# Patient Record
Sex: Female | Born: 1980 | Race: Black or African American | Hispanic: No | Marital: Single | Smoking: Current every day smoker
Health system: Southern US, Community
[De-identification: ages and names within clinical notes are randomized; demographics above are authoritative.]

## PROBLEM LIST (undated history)

## (undated) DIAGNOSIS — F1990 Other psychoactive substance use, unspecified, uncomplicated: Secondary | ICD-10-CM

## (undated) DIAGNOSIS — F419 Anxiety disorder, unspecified: Secondary | ICD-10-CM

## (undated) DIAGNOSIS — D219 Benign neoplasm of connective and other soft tissue, unspecified: Secondary | ICD-10-CM

## (undated) DIAGNOSIS — F329 Major depressive disorder, single episode, unspecified: Secondary | ICD-10-CM

## (undated) DIAGNOSIS — F32A Depression, unspecified: Secondary | ICD-10-CM

## (undated) DIAGNOSIS — I10 Essential (primary) hypertension: Secondary | ICD-10-CM

## (undated) DIAGNOSIS — R011 Cardiac murmur, unspecified: Secondary | ICD-10-CM

## (undated) DIAGNOSIS — N83209 Unspecified ovarian cyst, unspecified side: Secondary | ICD-10-CM

## (undated) DIAGNOSIS — F319 Bipolar disorder, unspecified: Secondary | ICD-10-CM

## (undated) DIAGNOSIS — D649 Anemia, unspecified: Secondary | ICD-10-CM

## (undated) HISTORY — DX: Anxiety disorder, unspecified: F41.9

## (undated) HISTORY — DX: Bipolar disorder, unspecified: F31.9

## (undated) HISTORY — DX: Benign neoplasm of connective and other soft tissue, unspecified: D21.9

---

## 2003-11-07 ENCOUNTER — Emergency Department (HOSPITAL_COMMUNITY): Admission: EM | Admit: 2003-11-07 | Discharge: 2003-11-07 | Payer: Self-pay | Admitting: Emergency Medicine

## 2003-11-18 ENCOUNTER — Emergency Department (HOSPITAL_COMMUNITY): Admission: EM | Admit: 2003-11-18 | Discharge: 2003-11-18 | Payer: Self-pay | Admitting: Emergency Medicine

## 2004-01-12 ENCOUNTER — Inpatient Hospital Stay (HOSPITAL_COMMUNITY): Admission: AD | Admit: 2004-01-12 | Discharge: 2004-01-12 | Payer: Self-pay | Admitting: *Deleted

## 2007-09-23 ENCOUNTER — Emergency Department (HOSPITAL_COMMUNITY): Admission: EM | Admit: 2007-09-23 | Discharge: 2007-09-23 | Payer: Self-pay | Admitting: Emergency Medicine

## 2008-04-09 ENCOUNTER — Emergency Department (HOSPITAL_COMMUNITY): Admission: EM | Admit: 2008-04-09 | Discharge: 2008-04-09 | Payer: Self-pay | Admitting: Emergency Medicine

## 2008-06-12 ENCOUNTER — Emergency Department (HOSPITAL_COMMUNITY): Admission: EM | Admit: 2008-06-12 | Discharge: 2008-06-12 | Payer: Self-pay | Admitting: *Deleted

## 2011-05-09 LAB — URINALYSIS, ROUTINE W REFLEX MICROSCOPIC
Glucose, UA: NEGATIVE
Ketones, ur: NEGATIVE
Leukocytes, UA: NEGATIVE
Nitrite: NEGATIVE
Protein, ur: 30 — AB
Specific Gravity, Urine: 1.027
Urobilinogen, UA: 0.2

## 2011-05-09 LAB — WET PREP, GENITAL: Yeast Wet Prep HPF POC: NONE SEEN

## 2011-05-09 LAB — PREGNANCY, URINE: Preg Test, Ur: POSITIVE

## 2011-05-09 LAB — URINE MICROSCOPIC-ADD ON

## 2011-05-09 LAB — GC/CHLAMYDIA PROBE AMP, GENITAL: Chlamydia, DNA Probe: NEGATIVE

## 2011-05-09 LAB — HCG, QUANTITATIVE, PREGNANCY: hCG, Beta Chain, Quant, S: 44799 — ABNORMAL HIGH

## 2016-11-06 ENCOUNTER — Encounter (HOSPITAL_COMMUNITY): Payer: Self-pay

## 2016-11-06 ENCOUNTER — Emergency Department (HOSPITAL_COMMUNITY)
Admission: EM | Admit: 2016-11-06 | Discharge: 2016-11-06 | Disposition: A | Discharge status: Home / Self Care | Payer: Self-pay | Attending: Emergency Medicine | Admitting: Emergency Medicine

## 2016-11-06 DIAGNOSIS — N760 Acute vaginitis: Secondary | ICD-10-CM

## 2016-11-06 DIAGNOSIS — F17290 Nicotine dependence, other tobacco product, uncomplicated: Secondary | ICD-10-CM | POA: Insufficient documentation

## 2016-11-06 DIAGNOSIS — B9689 Other specified bacterial agents as the cause of diseases classified elsewhere: Secondary | ICD-10-CM

## 2016-11-06 DIAGNOSIS — O99330 Smoking (tobacco) complicating pregnancy, unspecified trimester: Secondary | ICD-10-CM | POA: Insufficient documentation

## 2016-11-06 DIAGNOSIS — Z3A00 Weeks of gestation of pregnancy not specified: Secondary | ICD-10-CM | POA: Insufficient documentation

## 2016-11-06 DIAGNOSIS — N946 Dysmenorrhea, unspecified: Secondary | ICD-10-CM | POA: Insufficient documentation

## 2016-11-06 DIAGNOSIS — O23599 Infection of other part of genital tract in pregnancy, unspecified trimester: Secondary | ICD-10-CM | POA: Insufficient documentation

## 2016-11-06 HISTORY — DX: Unspecified ovarian cyst, unspecified side: N83.209

## 2016-11-06 LAB — BASIC METABOLIC PANEL
ANION GAP: 10 (ref 5–15)
BUN: 17 mg/dL (ref 6–20)
CHLORIDE: 107 mmol/L (ref 101–111)
CO2: 22 mmol/L (ref 22–32)
Calcium: 8.7 mg/dL — ABNORMAL LOW (ref 8.9–10.3)
Creatinine, Ser: 0.94 mg/dL (ref 0.44–1.00)
GFR calc non Af Amer: >60 mL/min (ref >60)
GLUCOSE: 108 mg/dL — AB (ref 65–99)
POTASSIUM: 3.7 mmol/L (ref 3.5–5.1)
Sodium: 139 mmol/L (ref 135–145)

## 2016-11-06 LAB — CBC
HEMATOCRIT: 37.6 % (ref 36.0–46.0)
HEMOGLOBIN: 12.3 g/dL (ref 12.0–15.0)
MCH: 30 pg (ref 26.0–34.0)
MCHC: 32.7 g/dL (ref 30.0–36.0)
MCV: 91.7 fL (ref 78.0–100.0)
Platelets: 282 10*3/uL (ref 150–400)
RBC: 4.1 MIL/uL (ref 3.87–5.11)
RDW: 14.8 % (ref 11.5–15.5)
WBC: 5.9 10*3/uL (ref 4.0–10.5)

## 2016-11-06 LAB — I-STAT BETA HCG BLOOD, ED (MC, WL, AP ONLY): I-stat hCG, quantitative: <5 m[IU]/mL (ref <5)

## 2016-11-06 LAB — WET PREP, GENITAL
SPERM: NONE SEEN
TRICH WET PREP: NONE SEEN
Yeast Wet Prep HPF POC: NONE SEEN

## 2016-11-06 MED ORDER — AZITHROMYCIN 250 MG PO TABS
1000.0000 mg | ORAL_TABLET | Freq: Once | ORAL | Status: AC
  Administered 2016-11-06: 1000 mg via ORAL
  Filled 2016-11-06: qty 4

## 2016-11-06 MED ORDER — CEFTRIAXONE SODIUM 250 MG IJ SOLR
250.0000 mg | Freq: Once | INTRAMUSCULAR | Status: AC
  Administered 2016-11-06: 250 mg via INTRAMUSCULAR
  Filled 2016-11-06: qty 250

## 2016-11-06 MED ORDER — LIDOCAINE HCL (PF) 1 % IJ SOLN
INTRAMUSCULAR | Status: AC
  Administered 2016-11-06: 0.9 mL
  Filled 2016-11-06: qty 5

## 2016-11-06 MED ORDER — METRONIDAZOLE 500 MG PO TABS: 500.0000 mg | ORAL_TABLET | Freq: Two times a day (BID) | ORAL | 0 refills | Status: DC

## 2016-11-06 NOTE — ED Triage Notes (Signed)
Per Pt, Pt is coming from home with complaints of vaginal bleeding. Pt reports taking a positive pregnancy test one month ago. Denies any clots. Last Menstrual Cylce was the beginning of February. Reports sharp pains in her stomach.
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
## 2016-11-07 LAB — HIV ANTIBODY (ROUTINE TESTING W REFLEX): HIV SCREEN 4TH GENERATION: NONREACTIVE

## 2016-11-07 LAB — GC/CHLAMYDIA PROBE AMP (~~LOC~~) NOT AT ARMC
Chlamydia: NEGATIVE
NEISSERIA GONORRHEA: NEGATIVE

## 2016-11-07 LAB — RPR: RPR: NONREACTIVE

## 2017-04-07 ENCOUNTER — Emergency Department (HOSPITAL_COMMUNITY)
Admission: EM | Admit: 2017-04-07 | Discharge: 2017-04-07 | Disposition: A | Discharge status: Home / Self Care | Payer: Self-pay | Attending: Emergency Medicine | Admitting: Emergency Medicine

## 2017-04-07 ENCOUNTER — Ambulatory Visit (HOSPITAL_COMMUNITY)
Admission: EM | Admit: 2017-04-07 | Discharge: 2017-04-07 | Disposition: A | Discharge status: Home / Self Care | Payer: Self-pay

## 2017-04-07 ENCOUNTER — Encounter (HOSPITAL_COMMUNITY): Payer: Self-pay

## 2017-04-07 DIAGNOSIS — Z0441 Encounter for examination and observation following alleged adult rape: Secondary | ICD-10-CM | POA: Insufficient documentation

## 2017-04-07 DIAGNOSIS — Z5321 Procedure and treatment not carried out due to patient leaving prior to being seen by health care provider: Secondary | ICD-10-CM | POA: Insufficient documentation
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
## 2017-04-07 NOTE — ED Notes (Addendum)
Called out pt's name x3, no answer. Looked for pt in triage, no answer. Notified Audrey(RN)

## 2017-05-19 ENCOUNTER — Emergency Department (HOSPITAL_COMMUNITY)
Admission: EM | Admit: 2017-05-19 | Discharge: 2017-05-20 | Disposition: A | Discharge status: Home / Self Care | Payer: Self-pay | Attending: Emergency Medicine | Admitting: Emergency Medicine

## 2017-05-19 DIAGNOSIS — R5381 Other malaise: Secondary | ICD-10-CM | POA: Insufficient documentation

## 2017-05-19 DIAGNOSIS — F17210 Nicotine dependence, cigarettes, uncomplicated: Secondary | ICD-10-CM | POA: Insufficient documentation

## 2017-05-19 LAB — URINALYSIS, ROUTINE W REFLEX MICROSCOPIC
Bilirubin Urine: NEGATIVE
Glucose, UA: NEGATIVE mg/dL
HGB URINE DIPSTICK: NEGATIVE
Ketones, ur: NEGATIVE mg/dL
LEUKOCYTES UA: NEGATIVE
Nitrite: NEGATIVE
PROTEIN: 30 mg/dL — AB
SPECIFIC GRAVITY, URINE: 1.023 (ref 1.005–1.030)
pH: 5 (ref 5.0–8.0)

## 2017-05-19 LAB — COMPREHENSIVE METABOLIC PANEL
ALBUMIN: 3.8 g/dL (ref 3.5–5.0)
ALT: 54 U/L (ref 14–54)
AST: 62 U/L — AB (ref 15–41)
Alkaline Phosphatase: 92 U/L (ref 38–126)
Anion gap: 6 (ref 5–15)
BILIRUBIN TOTAL: 0.6 mg/dL (ref 0.3–1.2)
BUN: 11 mg/dL (ref 6–20)
CHLORIDE: 104 mmol/L (ref 101–111)
CO2: 23 mmol/L (ref 22–32)
CREATININE: 0.8 mg/dL (ref 0.44–1.00)
Calcium: 8.3 mg/dL — ABNORMAL LOW (ref 8.9–10.3)
GFR calc Af Amer: >60 mL/min (ref >60)
GLUCOSE: 97 mg/dL (ref 65–99)
POTASSIUM: 4 mmol/L (ref 3.5–5.1)
Sodium: 133 mmol/L — ABNORMAL LOW (ref 135–145)
Total Protein: 7.1 g/dL (ref 6.5–8.1)

## 2017-05-19 LAB — CBC WITH DIFFERENTIAL/PLATELET
Basophils Absolute: 0 10*3/uL (ref 0.0–0.1)
Basophils Relative: 1 %
EOS PCT: 1 %
Eosinophils Absolute: 0 10*3/uL (ref 0.0–0.7)
HEMATOCRIT: 38.5 % (ref 36.0–46.0)
Hemoglobin: 12.8 g/dL (ref 12.0–15.0)
LYMPHS PCT: 33 %
Lymphs Abs: 2 10*3/uL (ref 0.7–4.0)
MCH: 31.1 pg (ref 26.0–34.0)
MCHC: 33.2 g/dL (ref 30.0–36.0)
MCV: 93.4 fL (ref 78.0–100.0)
MONO ABS: 0.7 10*3/uL (ref 0.1–1.0)
MONOS PCT: 11 %
NEUTROS ABS: 3.3 10*3/uL (ref 1.7–7.7)
Neutrophils Relative %: 54 %
PLATELETS: 283 10*3/uL (ref 150–400)
RBC: 4.12 MIL/uL (ref 3.87–5.11)
RDW: 16.7 % — AB (ref 11.5–15.5)
WBC: 6 10*3/uL (ref 4.0–10.5)

## 2017-05-19 LAB — I-STAT BETA HCG BLOOD, ED (MC, WL, AP ONLY)

## 2017-05-19 LAB — LIPASE, BLOOD: LIPASE: 39 U/L (ref 11–51)
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
## 2017-05-20 NOTE — Discharge Instructions (Signed)
You can be discharged home. Push plenty of fluids. Return here as needed for worsening symptoms or new concerns.

## 2017-07-23 ENCOUNTER — Other Ambulatory Visit: Payer: Self-pay

## 2017-07-23 ENCOUNTER — Encounter (HOSPITAL_COMMUNITY): Payer: Self-pay

## 2017-07-23 ENCOUNTER — Emergency Department (HOSPITAL_COMMUNITY)
Admission: EM | Admit: 2017-07-23 | Discharge: 2017-07-24 | Disposition: A | Discharge status: Other Acute Inpatient Hospital | Payer: Self-pay | Attending: Emergency Medicine | Admitting: Emergency Medicine

## 2017-07-23 DIAGNOSIS — R45851 Suicidal ideations: Secondary | ICD-10-CM | POA: Insufficient documentation

## 2017-07-23 DIAGNOSIS — F17290 Nicotine dependence, other tobacco product, uncomplicated: Secondary | ICD-10-CM | POA: Insufficient documentation

## 2017-07-23 DIAGNOSIS — F333 Major depressive disorder, recurrent, severe with psychotic symptoms: Secondary | ICD-10-CM | POA: Insufficient documentation

## 2017-07-23 HISTORY — DX: Depression, unspecified: F32.A

## 2017-07-23 HISTORY — DX: Major depressive disorder, single episode, unspecified: F32.9

## 2017-07-23 LAB — COMPREHENSIVE METABOLIC PANEL
ALK PHOS: 74 U/L (ref 38–126)
ALT: 83 U/L — ABNORMAL HIGH (ref 14–54)
ANION GAP: 10 (ref 5–15)
AST: 64 U/L — ABNORMAL HIGH (ref 15–41)
Albumin: 3.9 g/dL (ref 3.5–5.0)
BILIRUBIN TOTAL: 0.3 mg/dL (ref 0.3–1.2)
BUN: 6 mg/dL (ref 6–20)
CALCIUM: 8.6 mg/dL — AB (ref 8.9–10.3)
CO2: 25 mmol/L (ref 22–32)
Chloride: 106 mmol/L (ref 101–111)
Creatinine, Ser: 0.77 mg/dL (ref 0.44–1.00)
GFR calc non Af Amer: >60 mL/min (ref >60)
Glucose, Bld: 103 mg/dL — ABNORMAL HIGH (ref 65–99)
POTASSIUM: 3.8 mmol/L (ref 3.5–5.1)
SODIUM: 141 mmol/L (ref 135–145)
TOTAL PROTEIN: 7.2 g/dL (ref 6.5–8.1)

## 2017-07-23 LAB — RAPID URINE DRUG SCREEN, HOSP PERFORMED
Amphetamines: NOT DETECTED
BENZODIAZEPINES: NOT DETECTED
Barbiturates: NOT DETECTED
Cocaine: POSITIVE — AB
OPIATES: NOT DETECTED
Tetrahydrocannabinol: NOT DETECTED

## 2017-07-23 LAB — CBC
HEMATOCRIT: 41 % (ref 36.0–46.0)
HEMOGLOBIN: 13.6 g/dL (ref 12.0–15.0)
MCH: 32.3 pg (ref 26.0–34.0)
MCHC: 33.2 g/dL (ref 30.0–36.0)
MCV: 97.4 fL (ref 78.0–100.0)
Platelets: 292 10*3/uL (ref 150–400)
RBC: 4.21 MIL/uL (ref 3.87–5.11)
RDW: 15.1 % (ref 11.5–15.5)
WBC: 6.1 10*3/uL (ref 4.0–10.5)

## 2017-07-23 LAB — I-STAT BETA HCG BLOOD, ED (MC, WL, AP ONLY)

## 2017-07-23 LAB — ACETAMINOPHEN LEVEL

## 2017-07-23 LAB — SALICYLATE LEVEL

## 2017-07-23 LAB — ETHANOL: Alcohol, Ethyl (B): 167 mg/dL — ABNORMAL HIGH (ref <10)

## 2017-07-23 MED ORDER — LORAZEPAM 1 MG PO TABS
1.0000 mg | ORAL_TABLET | Freq: Once | ORAL | Status: AC
  Administered 2017-07-23: 1 mg via ORAL
  Filled 2017-07-23: qty 1

## 2017-07-23 NOTE — ED Notes (Signed)
All belongings inventoried and placed in locker #2
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
## 2017-07-23 NOTE — Care Management (Signed)
Per Corene Cornea, NP- patient meets inpt criteria.  Writer faxed referrals to the following facilities: 07/23/17 Referred to [facility], [facility], [facility], [facility], [facility], [facility], [facility], [facility]

## 2017-07-23 NOTE — ED Notes (Signed)
Patient denies pain and is resting comfortably.  

## 2017-07-23 NOTE — ED Triage Notes (Addendum)
Patient arrived tearful to ED. Complains of rectal bleeding for 2 years with BM. States that she took unknown medication 2 days ago in attempt to kill herself. Also has been self cutting-superficial for some time. Alert and oriented, NAD. hasnt taken behavioral meds for 1 year
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
## 2017-07-23 NOTE — ED Provider Notes (Signed)
Behavioral health recommends inpatient management; no available facilities a this time.  In the process of finding appropriate facility.  Patient will remain at [facility] for the time being.   Denise Regal, PA-C 07/23/17 2050    Duffy Bruce, MD 07/24/17 [phone]
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
## 2017-07-23 NOTE — ED Notes (Signed)
TTS at bedside. 

## 2017-07-24 ENCOUNTER — Inpatient Hospital Stay
Admission: AD | Admit: 2017-07-24 | Discharge: 2017-08-03 | DRG: 885 | Disposition: A | Discharge status: Home / Self Care | Payer: No Typology Code available for payment source | Source: Intra-hospital | Attending: Psychiatry | Admitting: Psychiatry

## 2017-07-24 DIAGNOSIS — H538 Other visual disturbances: Secondary | ICD-10-CM | POA: Diagnosis not present

## 2017-07-24 DIAGNOSIS — K5900 Constipation, unspecified: Secondary | ICD-10-CM | POA: Diagnosis not present

## 2017-07-24 DIAGNOSIS — F4310 Post-traumatic stress disorder, unspecified: Secondary | ICD-10-CM | POA: Diagnosis present

## 2017-07-24 DIAGNOSIS — F333 Major depressive disorder, recurrent, severe with psychotic symptoms: Secondary | ICD-10-CM | POA: Diagnosis present

## 2017-07-24 DIAGNOSIS — G4700 Insomnia, unspecified: Secondary | ICD-10-CM | POA: Diagnosis present

## 2017-07-24 DIAGNOSIS — F1420 Cocaine dependence, uncomplicated: Secondary | ICD-10-CM | POA: Diagnosis present

## 2017-07-24 DIAGNOSIS — Z91410 Personal history of adult physical and sexual abuse: Secondary | ICD-10-CM

## 2017-07-24 DIAGNOSIS — F17200 Nicotine dependence, unspecified, uncomplicated: Secondary | ICD-10-CM | POA: Diagnosis present

## 2017-07-24 DIAGNOSIS — Z915 Personal history of self-harm: Secondary | ICD-10-CM

## 2017-07-24 DIAGNOSIS — Z590 Homelessness: Secondary | ICD-10-CM

## 2017-07-24 DIAGNOSIS — R45851 Suicidal ideations: Secondary | ICD-10-CM | POA: Diagnosis present

## 2017-07-24 DIAGNOSIS — F1290 Cannabis use, unspecified, uncomplicated: Secondary | ICD-10-CM | POA: Diagnosis present

## 2017-07-24 DIAGNOSIS — F17290 Nicotine dependence, other tobacco product, uncomplicated: Secondary | ICD-10-CM | POA: Diagnosis present

## 2017-07-24 DIAGNOSIS — R51 Headache: Secondary | ICD-10-CM | POA: Diagnosis not present

## 2017-07-24 DIAGNOSIS — Y906 Blood alcohol level of 120-199 mg/100 ml: Secondary | ICD-10-CM | POA: Diagnosis present

## 2017-07-24 DIAGNOSIS — F1020 Alcohol dependence, uncomplicated: Secondary | ICD-10-CM | POA: Diagnosis present

## 2017-07-24 MED ORDER — HYDROXYZINE HCL 50 MG PO TABS
50.0000 mg | ORAL_TABLET | Freq: Three times a day (TID) | ORAL | Status: DC | PRN
Start: 2017-07-24 — End: 2017-07-28
  Administered 2017-07-24 – 2017-07-28 (×6): 50 mg via ORAL
  Filled 2017-07-24 (×6): qty 1

## 2017-07-24 MED ORDER — TRAZODONE HCL 100 MG PO TABS
100.0000 mg | ORAL_TABLET | Freq: Every evening | ORAL | Status: DC | PRN
Start: 2017-07-24 — End: 2017-07-27
  Administered 2017-07-24 – 2017-07-26 (×3): 100 mg via ORAL
  Filled 2017-07-24 (×3): qty 1

## 2017-07-24 MED ORDER — ALUM & MAG HYDROXIDE-SIMETH 200-200-20 MG/5ML PO SUSP: 30.0000 mL | ORAL | Status: DC | PRN

## 2017-07-24 MED ORDER — OLANZAPINE 5 MG PO TABS
5.0000 mg | ORAL_TABLET | ORAL | Status: DC | PRN
  Administered 2017-07-24 – 2017-07-25 (×2): 5 mg via ORAL
  Filled 2017-07-24 (×2): qty 1

## 2017-07-24 MED ORDER — LORAZEPAM 1 MG PO TABS
1.0000 mg | ORAL_TABLET | Freq: Once | ORAL | Status: AC
  Administered 2017-07-24: 1 mg via ORAL
  Filled 2017-07-24: qty 1

## 2017-07-24 MED ORDER — MAGNESIUM HYDROXIDE 400 MG/5ML PO SUSP
30.0000 mL | Freq: Every day | ORAL | Status: DC | PRN
  Administered 2017-07-25 – 2017-08-01 (×5): 30 mL via ORAL
  Filled 2017-07-24 (×5): qty 30

## 2017-07-24 MED ORDER — ACETAMINOPHEN 325 MG PO TABS
650.0000 mg | ORAL_TABLET | Freq: Four times a day (QID) | ORAL | Status: DC | PRN
  Administered 2017-07-27 – 2017-07-29 (×2): 650 mg via ORAL
  Filled 2017-07-24 (×2): qty 2

## 2017-07-24 MED ORDER — ACETAMINOPHEN 325 MG PO TABS
650.0000 mg | ORAL_TABLET | Freq: Once | ORAL | Status: AC
  Administered 2017-07-24: 650 mg via ORAL
  Filled 2017-07-24: qty 2

## 2017-07-24 NOTE — ED Notes (Signed)
Regular Diet ordered for Lunch. 

## 2017-07-24 NOTE — BH Assessment (Signed)
Patient has been accepted to [facility].  Accepting physician is Dr. Wonda Olds. Attending Physician will be Dr. Wonda Olds.  Patient has been assigned to room 323, by [place] Charge Nurse T'Yawn.  Call report to [phone].  Representative/Transfer Coordinator is Thomasenia Sales Patient pre-admitted by [facility] Patient Access Genella Rife)   [facility]  (Ava,TTS and [facility]) made aware of acceptance.  Pt bed is available anytime after 8:00pm.

## 2017-07-24 NOTE — ED Provider Notes (Signed)
Patient accepted by behavioral health at [facility].  Transfer disposition completed   Fredia Sorrow, MD 07/24/17 [phone]

## 2017-07-24 NOTE — ED Notes (Signed)
Patient was given Sprite and Cookies.

## 2017-07-24 NOTE — ED Notes (Signed)
TTS completed. 

## 2017-07-24 NOTE — ED Notes (Signed)
Pt asked to use the phone  Ery upset after the conversation with the person on the telephone

## 2017-07-24 NOTE — BHH Counselor (Signed)
Patient has been accepted to [facility].  Patient can come after 8pm.  Dr,. McNew is the accepting physician. The number to give report is [phone].  Writer informed the nurse working with the patient.

## 2017-07-24 NOTE — ED Notes (Signed)
Pt leaving with pelhan to [place]
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
## 2017-07-25 ENCOUNTER — Other Ambulatory Visit: Payer: Self-pay

## 2017-07-25 DIAGNOSIS — F4310 Post-traumatic stress disorder, unspecified: Secondary | ICD-10-CM

## 2017-07-25 LAB — COMPREHENSIVE METABOLIC PANEL
ALT: 54 U/L (ref 14–54)
ANION GAP: 5 (ref 5–15)
AST: 43 U/L — ABNORMAL HIGH (ref 15–41)
Albumin: 3.3 g/dL — ABNORMAL LOW (ref 3.5–5.0)
Alkaline Phosphatase: 64 U/L (ref 38–126)
BILIRUBIN TOTAL: 0.5 mg/dL (ref 0.3–1.2)
BUN: 10 mg/dL (ref 6–20)
CHLORIDE: 107 mmol/L (ref 101–111)
CO2: 26 mmol/L (ref 22–32)
Calcium: 8.5 mg/dL — ABNORMAL LOW (ref 8.9–10.3)
Creatinine, Ser: 0.87 mg/dL (ref 0.44–1.00)
Glucose, Bld: 124 mg/dL — ABNORMAL HIGH (ref 65–99)
POTASSIUM: 3.8 mmol/L (ref 3.5–5.1)
Sodium: 138 mmol/L (ref 135–145)
TOTAL PROTEIN: 6.2 g/dL — AB (ref 6.5–8.1)

## 2017-07-25 LAB — CBC
HEMATOCRIT: 37.8 % (ref 35.0–47.0)
Hemoglobin: 12.7 g/dL (ref 12.0–16.0)
MCH: 33.2 pg (ref 26.0–34.0)
MCHC: 33.6 g/dL (ref 32.0–36.0)
MCV: 98.7 fL (ref 80.0–100.0)
PLATELETS: 217 10*3/uL (ref 150–440)
RBC: 3.83 MIL/uL (ref 3.80–5.20)
RDW: 15.2 % — AB (ref 11.5–14.5)
WBC: 4.7 10*3/uL (ref 3.6–11.0)

## 2017-07-25 LAB — TSH: TSH: 6.151 u[IU]/mL — ABNORMAL HIGH (ref 0.350–4.500)

## 2017-07-25 LAB — LIPID PANEL
CHOL/HDL RATIO: 2.7 ratio
CHOLESTEROL: 139 mg/dL (ref 0–200)
HDL: 52 mg/dL (ref >40)
LDL Cholesterol: 65 mg/dL (ref 0–99)
TRIGLYCERIDES: 110 mg/dL (ref <150)
VLDL: 22 mg/dL (ref 0–40)

## 2017-07-25 MED ORDER — PRAZOSIN HCL 1 MG PO CAPS
1.0000 mg | ORAL_CAPSULE | Freq: Every day | ORAL | Status: DC
  Administered 2017-07-25: 1 mg via ORAL
  Filled 2017-07-25 (×2): qty 1

## 2017-07-25 MED ORDER — CITALOPRAM HYDROBROMIDE 20 MG PO TABS
20.0000 mg | ORAL_TABLET | Freq: Every day | ORAL | Status: DC
  Administered 2017-07-25 – 2017-08-01 (×8): 20 mg via ORAL
  Filled 2017-07-25 (×8): qty 1

## 2017-07-25 MED ORDER — RISPERIDONE 1 MG PO TABS
1.0000 mg | ORAL_TABLET | Freq: Every day | ORAL | Status: DC
  Administered 2017-07-25 – 2017-07-26 (×2): 1 mg via ORAL
  Filled 2017-07-25 (×2): qty 1

## 2017-07-25 NOTE — Plan of Care (Signed)
Patient comes out for meals. She stays in the bed most of the day. She states she hears voices  but no hallucinations. She denies having si. She is pleasant and cooperative. Prn medications given see mar. Will continue to monitor. Safety maintained.
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
## 2017-07-25 NOTE — Plan of Care (Signed)
Pt verbally contracts for safety and states she is able to remain safe on the unit. Pt reports no psychotic hallucinations this evening. Pt verbalizes understandings of educational material provided. Pt reports utilization of coping skills improving.

## 2017-07-25 NOTE — [facility] Group Notes (Signed)
07/25/2017 1:15pm  Type of Therapy and Topic: Group Therapy: Holding on to Grudges   Participation Level: Did Not Attend   Description of Group:  In this group patients will be asked to explore and define a grudge. Patients will be guided to discuss their thoughts, feelings, and reasons as to why people have grudges. Patients will process the impact grudges have on daily life and identify thoughts and feelings related to holding grudges. Facilitator will challenge patients to identify ways to let go of grudges and the benefits this provides. Patients will be confronted to address why one struggles letting go of grudges. Lastly, patients will identify feelings and thoughts related to what life would look like without grudges. This group will be process-oriented, with patients participating in exploration of their own experiences, giving and receiving support, and processing challenge from other group members.  Therapeutic Goals:  1. Patient will identify specific grudges related to their personal life.  2. Patient will identify feelings, thoughts, and beliefs around grudges.  3. Patient will identify how one releases grudges appropriately.  4. Patient will identify situations where they could have let go of the grudge, but instead chose to hold on.   Summary of Patient Progress:   Therapeutic Modalities:  Cognitive Behavioral Therapy  Solution Focused Therapy  Motivational Interviewing  Brief Therapy   Denise Brandt  CUEBAS-COLON, LCSW 07/25/2017 12:01 PM

## 2017-07-25 NOTE — [facility]HH Suicide Risk Assessment (Signed)
[facility] INPATIENT:  Family/Significant Other Suicide Prevention Education  Suicide Prevention Education:  Patient Refusal for Family/Significant Other Suicide Prevention Education: The patient Denise Brandt has refused to provide written consent for family/significant other to be provided Family/Significant Other Suicide Prevention Education during admission and/or prior to discharge.  Physician notified.  Chanler Mendonca  CUEBAS-COLON 07/25/2017, 4:10 PM
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
## 2017-07-26 ENCOUNTER — Encounter: Payer: Self-pay | Admitting: Psychiatry

## 2017-07-26 DIAGNOSIS — F1020 Alcohol dependence, uncomplicated: Secondary | ICD-10-CM | POA: Diagnosis present

## 2017-07-26 DIAGNOSIS — R45851 Suicidal ideations: Secondary | ICD-10-CM

## 2017-07-26 DIAGNOSIS — F17200 Nicotine dependence, unspecified, uncomplicated: Secondary | ICD-10-CM | POA: Diagnosis present

## 2017-07-26 DIAGNOSIS — F1420 Cocaine dependence, uncomplicated: Secondary | ICD-10-CM | POA: Diagnosis present

## 2017-07-26 DIAGNOSIS — F333 Major depressive disorder, recurrent, severe with psychotic symptoms: Secondary | ICD-10-CM | POA: Diagnosis present

## 2017-07-26 LAB — TSH: TSH: 4.472 u[IU]/mL (ref 0.350–4.500)

## 2017-07-26 LAB — HEMOGLOBIN A1C
Hgb A1c MFr Bld: 5.3 % (ref 4.8–5.6)
Mean Plasma Glucose: 105.41 mg/dL

## 2017-07-26 LAB — T4, FREE: FREE T4: 0.74 ng/dL (ref 0.61–1.12)

## 2017-07-26 MED ORDER — DOCUSATE SODIUM 100 MG PO CAPS
100.0000 mg | ORAL_CAPSULE | Freq: Two times a day (BID) | ORAL | Status: DC
  Administered 2017-07-26 – 2017-08-02 (×15): 100 mg via ORAL
  Filled 2017-07-26 (×18): qty 1

## 2017-07-26 MED ORDER — NICOTINE 21 MG/24HR TD PT24
21.0000 mg | MEDICATED_PATCH | Freq: Every day | TRANSDERMAL | Status: DC
  Administered 2017-07-26 – 2017-08-03 (×8): 21 mg via TRANSDERMAL
  Filled 2017-07-26 (×7): qty 1

## 2017-07-26 MED ORDER — PRAZOSIN HCL 2 MG PO CAPS
2.0000 mg | ORAL_CAPSULE | Freq: Every day | ORAL | Status: DC
  Administered 2017-07-26 – 2017-08-02 (×8): 2 mg via ORAL
  Filled 2017-07-26 (×9): qty 1

## 2017-07-26 NOTE — Progress Notes (Signed)
[facility] MD Progress Note  07/26/2017 12:59 PM Denise Brandt  MRN:  [id number] Subjective:   Pt is a 36 year old female with h/o PTSD, depression admitted for  worsening depression,  and suicidal ideation.  Pt reportedly  endorsing anxiety and depression, and  vivid dreams. She  is guarded, states  she sees "people" on and off and talk "I to myself", endorsing depression and nightmares. Med compliant, denies side effects.     Principal Problem: <principal problem not specified> Diagnosis:   Patient Active Problem List   Diagnosis Date Noted  . PTSD (post-traumatic stress disorder) [F43.10] 07/24/2017   Total Time spent with patient: 25 min  Past Psychiatric History: no new info   Past Medical History:  Past Medical History:  Diagnosis Date  . Depression   . Ovarian cyst     Past Surgical History:  Procedure Laterality Date  . CESAREAN SECTION     Family History: History reviewed. No pertinent family history. Family Psychiatric  History: no new info Social History:  Social History   Substance and Sexual Activity  Alcohol Use Yes  . Alcohol/week: 2.4 oz  . Types: 4 Cans of beer per week   Comment: daily     Social History   Substance and Sexual Activity  Drug Use No    Social History   Socioeconomic History  . Marital status: Single    Spouse name: None  . Number of children: None  . Years of education: None  . Highest education level: None  Social Needs  . Financial resource strain: None  . Food insecurity - worry: None  . Food insecurity - inability: None  . Transportation needs - medical: None  . Transportation needs - non-medical: None  Occupational History  . None  Tobacco Use  . Smoking status: Current Every Day Smoker    Types: Cigars  . Smokeless tobacco: Never Used  Substance and Sexual Activity  . Alcohol use: Yes    Alcohol/week: 2.4 oz    Types: 4 Cans of beer per week    Comment: daily  . Drug use: No  . Sexual activity: None  Other  Topics Concern  . None  Social History Narrative  . None   Additional Social History:                         Sleep: Good  Appetite:  Fair  Current Medications: Current Facility-Administered Medications  Medication Dose Route Frequency Provider Last Rate Last Dose  . acetaminophen (TYLENOL) tablet 650 mg  650 mg Oral Q6H PRN McNew, Tyson Babinski, MD      . alum & mag hydroxide-simeth (MAALOX/MYLANTA) 200-200-20 MG/5ML suspension 30 mL  30 mL Oral Q4H PRN McNew, Tyson Babinski, MD      . citalopram (CELEXA) tablet 20 mg  20 mg Oral Daily Lenward Chancellor, MD   20 mg at 07/26/17 0827  . hydrOXYzine (ATARAX/VISTARIL) tablet 50 mg  50 mg Oral TID PRN Marylin Crosby, MD   50 mg at 07/25/17 3254  . magnesium hydroxide (MILK OF MAGNESIA) suspension 30 mL  30 mL Oral Daily PRN Marylin Crosby, MD   30 mL at 07/26/17 1232  . OLANZapine (ZYPREXA) tablet 5 mg  5 mg Oral Q4H PRN Marylin Crosby, MD   5 mg at 07/25/17 [phone]  . prazosin (MINIPRESS) capsule 1 mg  1 mg Oral QHS Lenward Chancellor, MD   1 mg at 07/25/17 2136  .  risperiDONE (RISPERDAL) tablet 1 mg  1 mg Oral QHS Lenward Chancellor, MD   1 mg at 07/25/17 2136  . traZODone (DESYREL) tablet 100 mg  100 mg Oral QHS PRN McNew, Tyson Babinski, MD   100 mg at 07/25/17 2143    Lab Results:  Results for orders placed or performed during the hospital encounter of 07/24/17 (from the past 48 hour(s))  CBC     Status: Abnormal   Collection Time: 07/25/17  7:07 AM  Result Value Ref Range   WBC 4.7 3.6 - 11.0 K/uL   RBC 3.83 3.80 - 5.20 MIL/uL   Hemoglobin 12.7 12.0 - 16.0 g/dL   HCT 37.8 35.0 - 47.0 %   MCV 98.7 80.0 - 100.0 fL   MCH 33.2 26.0 - 34.0 pg   MCHC 33.6 32.0 - 36.0 g/dL   RDW 15.2 (H) 11.5 - 14.5 %   Platelets 217 150 - 440 K/uL  Comprehensive metabolic panel     Status: Abnormal   Collection Time: 07/25/17  7:07 AM  Result Value Ref Range   Sodium 138 135 - 145 mmol/L   Potassium 3.8 3.5 - 5.1 mmol/L   Chloride 107 101 - 111 mmol/L    CO2 26 22 - 32 mmol/L   Glucose, Bld 124 (H) 65 - 99 mg/dL   BUN 10 6 - 20 mg/dL   Creatinine, Ser 0.87 0.44 - 1.00 mg/dL   Calcium 8.5 (L) 8.9 - 10.3 mg/dL   Total Protein 6.2 (L) 6.5 - 8.1 g/dL   Albumin 3.3 (L) 3.5 - 5.0 g/dL   AST 43 (H) 15 - 41 U/L   ALT 54 14 - 54 U/L   Alkaline Phosphatase 64 38 - 126 U/L   Total Bilirubin 0.5 0.3 - 1.2 mg/dL   GFR calc non Af Amer >60 >60 mL/min   GFR calc Af Amer >60 >60 mL/min    Comment: (NOTE) The eGFR has been calculated using the CKD EPI equation. This calculation has not been validated in all clinical situations. eGFR's persistently <60 mL/min signify possible Chronic Kidney Disease.    Anion gap 5 5 - 15  Hemoglobin A1c     Status: None   Collection Time: 07/25/17  7:07 AM  Result Value Ref Range   Hgb A1c MFr Bld 5.3 4.8 - 5.6 %    Comment: (NOTE) Pre diabetes:          5.7%-6.4% Diabetes:              >6.4% Glycemic control for   <7.0% adults with diabetes    Mean Plasma Glucose 105.41 mg/dL    Comment: Performed at [facility] [address], [place], [place] [address]  Lipid panel     Status: None   Collection Time: 07/25/17  7:07 AM  Result Value Ref Range   Cholesterol 139 0 - 200 mg/dL   Triglycerides 110 <150 mg/dL   HDL 52 >40 mg/dL   Total CHOL/HDL Ratio 2.7 RATIO   VLDL 22 0 - 40 mg/dL   LDL Cholesterol 65 0 - 99 mg/dL    Comment:        Total Cholesterol/HDL:CHD Risk Coronary Heart Disease Risk Table                     Men   Women  1/2 Average Risk   3.4   3.3  Average Risk       5.0   4.4  2 X Average Risk   9.6   7.1  3 X Average Risk  23.4   11.0        Use the calculated Patient Ratio above and the CHD Risk Table to determine the patient's CHD Risk.        ATP III CLASSIFICATION (LDL):  <100     mg/dL   Optimal  100-129  mg/dL   Near or Above                    Optimal  130-159  mg/dL   Borderline  160-189  mg/dL   High  >190     mg/dL   Very High   TSH     Status: Abnormal    Collection Time: 07/25/17  7:07 AM  Result Value Ref Range   TSH 6.151 (H) 0.350 - 4.500 uIU/mL    Comment: Performed by a 3rd Generation assay with a functional sensitivity of <=0.01 uIU/mL.  TSH     Status: None   Collection Time: 07/26/17  6:23 AM  Result Value Ref Range   TSH 4.472 0.350 - 4.500 uIU/mL    Comment: Performed by a 3rd Generation assay with a functional sensitivity of <=0.01 uIU/mL.  T4, free     Status: None   Collection Time: 07/26/17  6:23 AM  Result Value Ref Range   Free T4 0.74 0.61 - 1.12 ng/dL    Comment: (NOTE) Biotin ingestion may interfere with free T4 tests. If the results are inconsistent with the TSH level, previous test results, or the clinical presentation, then consider biotin interference. If needed, order repeat testing after stopping biotin.     Blood Alcohol level:  Lab Results  Component Value Date   ETH 167 (H) 16/05/9603    Metabolic Disorder Labs: Lab Results  Component Value Date   HGBA1C 5.3 07/25/2017   MPG 105.41 07/25/2017   No results found for: PROLACTIN Lab Results  Component Value Date   CHOL 139 07/25/2017   TRIG 110 07/25/2017   HDL 52 07/25/2017   CHOLHDL 2.7 07/25/2017   VLDL 22 07/25/2017   LDLCALC 65 07/25/2017    Physical Findings: AIMS: Facial and Oral Movements Muscles of Facial Expression: None, normal Lips and Perioral Area: None, normal Jaw: None, normal Tongue: None, normal,Extremity Movements Upper (arms, wrists, hands, fingers): None, normal Lower (legs, knees, ankles, toes): None, normal, Trunk Movements Neck, shoulders, hips: None, normal, Overall Severity Severity of abnormal movements (highest score from questions above): None, normal Incapacitation due to abnormal movements: None, normal Patient's awareness of abnormal movements (rate only patient's report): No Awareness, Dental Status Current problems with teeth and/or dentures?: No Does patient usually wear dentures?: No  CIWA:     COWS:     Musculoskeletal: Strength & Muscle Tone: within normal limits Gait & Station: normal Patient leans:   Psychiatric Specialty Exam: Physical Exam  Nursing note and vitals reviewed.   ROS  Blood pressure 104/64, pulse 71, temperature 98.4 F (36.9 C), temperature source Oral, resp. rate 18, height 5' 4" (1.626 m), weight 73.9 kg (163 lb), unknown if currently breastfeeding.Body mass index is 27.98 kg/m.  General Appearance: Disheveled  Eye Contact:  Minimal  Speech:  Slow  Volume:  Normal  Mood:  Anxious and Irritable  Affect:  Depressed and Labile  Thought Process:  Goal Directed  Orientation:  Full (Time, Place, and Person)  Thought Content:  Depressed, about stressors, VH  Suicidal Thoughts:  Denies  today  Homicidal Thoughts:  No  Memory:  intact  Judgement:  Impaired  Insight:  Shallow  Psychomotor Activity:  Normal  Concentration:  fair  Recall:  [place] of Knowledge:  Good  Language:  Good  Akathisia:  No  Handed:    AIMS (if indicated):     Assets:  Communication Skills  ADL's:  Intact  Cognition:  WNL  Sleep:  Number of Hours: 7.45         Treatment Plan Summary: Daily contact with patient to assess and evaluate symptoms and progress in treatment and Medication management.  Pt is severely depressed, hopeless. Free T4 and rpt TSH- wnl.   Cont celexa for depression. Cont Risperdal for mood/psychosis.  Increase Minipress for nightmares. Group/Milieu tx.   Observation Level/Precautions:  15 minute checks  Laboratory:  CBC Chemistry Profile HCG UDS UA, TSH, EKG  Psychotherapy:    Medications:    Consultations:    Discharge Concerns:    Estimated LOS:  Other:     Physician Treatment Plan for Primary Diagnosis: <principal problem not specified> Long Term Goal(s): Improvement in symptoms so as ready for discharge  Short Term Goals: Ability to identify changes in lifestyle to reduce recurrence of condition will improve, Ability  to verbalize feelings will improve, Ability to disclose and discuss suicidal ideas, Ability to demonstrate self-control will improve, Ability to identify and develop effective coping behaviors will improve, Ability to maintain clinical measurements within normal limits will improve, Compliance with prescribed medications will improve and Ability to identify triggers associated with substance abuse/mental health issues will improve  Physician Treatment Plan for Secondary Diagnosis: Active Problems:   PTSD (post-traumatic stress disorder)  Long Term Goal(s): Improvement in symptoms so as ready for discharge  Short Term Goals: Ability to identify changes in lifestyle to reduce recurrence of condition will improve, Ability to verbalize feelings will improve, Ability to disclose and discuss suicidal ideas, Ability to demonstrate self-control will improve, Ability to identify and develop effective coping behaviors will improve, Ability to maintain clinical measurements within normal limits will improve, Compliance with prescribed medications will improve and Ability to identify triggers associated with substance abuse/mental health issues will improve  I certify that inpatient services furnished can reasonably be expected to improve the patient's condition.       Lenward Chancellor, MD 07/26/2017, 12:59 PM

## 2017-07-26 NOTE — Progress Notes (Signed)
D:Pt denies SI/HI/AVH this evening. Pt able to verbally contract for safety and states she can remain safe while on the unit. Pt reports anxiety and depression this evening at a 7 for both. Pt upon interaction with this writer is flat and soft spoken, but cooperative with interviewing.   A: Q x 15 minute observation checks were completed for safety. Patient was provided with education. Patient was given scheduled medications. Patient  was encourage to attend groups, participate in unit activities and continue with plan of care.   R:Patient is complaint with medication and unit procedures. Pt participated in unit activities this evening and snack. Pt reports "good" food intake.              Patient slept for Estimated Hours of 7.45; Precautionary checks every 15 minutes for safety maintained, room free of safety hazards, patient sustains no injury or falls during this shift.

## 2017-07-26 NOTE — [facility] Group Notes (Signed)
[facility] Group Notes:  (Nursing/MHT/Case Management/Adjunct)  Date:  07/26/2017  Time:  1:25 AM  Type of Therapy:  Psychoeducational Skills  Participation Level:  Did Not Attend  Summary of Progress/Problems:  Reece Agar 07/26/2017, 1:25 AM

## 2017-07-26 NOTE — Plan of Care (Signed)
Patient in the unit for meals. She denies si, hi, avh. Denies pain. She states she just wants the depression to go away. Started on celexa yesterday. She is pleasant and makes good eye contact. She does voice vivid dreams. Safety maintained. Will continue to monitor.

## 2017-07-27 MED ORDER — TRAZODONE HCL 100 MG PO TABS
100.0000 mg | ORAL_TABLET | Freq: Every day | ORAL | Status: DC
  Administered 2017-07-27 – 2017-07-28 (×2): 100 mg via ORAL
  Filled 2017-07-27 (×2): qty 1

## 2017-07-27 MED ORDER — RISPERIDONE 1 MG PO TABS
2.0000 mg | ORAL_TABLET | Freq: Two times a day (BID) | ORAL | Status: DC
  Administered 2017-07-27 – 2017-08-03 (×15): 2 mg via ORAL
  Filled 2017-07-27 (×15): qty 2

## 2017-07-27 NOTE — [facility] Group Notes (Signed)
07/27/2017 1:00PM   Type of Therapy and Topic:  Group Therapy:  Overcoming Obstacles   Participation Level:  Minimal   Description of Group:   In this group patients will be encouraged to explore what they see as obstacles to their own wellness and recovery. They will be guided to discuss their thoughts, feelings, and behaviors related to these obstacles. The group will process together ways to cope with barriers, with attention given to specific choices patients can make. Each patient will be challenged to identify changes they are motivated to make in order to overcome their obstacles. This group will be process-oriented, with patients participating in exploration of their own experiences, giving and receiving support, and processing challenge from other group members.   Therapeutic Goals: 1. Patient will identify personal and current obstacles as they relate to admission. 2. Patient will identify barriers that currently interfere with their wellness or overcoming obstacles.  3. Patient will identify feelings, thought process and behaviors related to these barriers. 4. Patient will identify two changes they are willing to make to overcome these obstacles:      Summary of Patient Progress Pt continues to work towards their tx goals but has not yet reached them. Pt was able to appropriately participate in group discussion, and was able to offer support/validation to other group members. Pt reported that she felt like her short term obstacle is to, "get my mind right." Pt reported that her long term obstacle is, "getting back to the person that I was." Pt reported that one step she can take to reach this goal is to, "find somewhere to live."    Therapeutic Modalities:   Cognitive Behavioral Therapy Solution Focused Therapy Motivational Interviewing Relapse Prevention Therapy  Alden Hipp, MSW, LCSW 07/27/2017 1:59 PM

## 2017-07-27 NOTE — Progress Notes (Signed)
Patient denied SI/HI/AVH. Patient maintained safety while on unit. Patient was appropriate. Patient adhered with scheduled medication.

## 2017-07-27 NOTE — Plan of Care (Signed)
Patient up on the unit. She is pleasant and cooperative. She is med compliant. She denies si, hi, avh. But does complain of  anxiety. Safety maintained. Will continue to monitor

## 2017-07-27 NOTE — Tx Team (Addendum)
Interdisciplinary Treatment and Diagnostic Plan Update  07/27/2017 Time of Session: 10:30am Denise Brandt MRN: [id number]  Principal Diagnosis: Severe recurrent major depressive disorder with psychotic features ([place])  Secondary Diagnoses: Principal Problem:   Severe recurrent major depressive disorder with psychotic features ([place]) Active Problems:   PTSD (post-traumatic stress disorder)   Alcohol use disorder, moderate, dependence (HCC)   Suicidal ideation   Tobacco use disorder   Cocaine use disorder, moderate, dependence ([place])   Current Medications:  Current Facility-Administered Medications  Medication Dose Route Frequency Provider Last Rate Last Dose  . acetaminophen (TYLENOL) tablet 650 mg  650 mg Oral Q6H PRN McNew, Tyson Babinski, MD      . alum & mag hydroxide-simeth (MAALOX/MYLANTA) 200-200-20 MG/5ML suspension 30 mL  30 mL Oral Q4H PRN McNew, Tyson Babinski, MD      . citalopram (CELEXA) tablet 20 mg  20 mg Oral Daily Lenward Chancellor, MD   20 mg at 07/27/17 0824  . docusate sodium (COLACE) capsule 100 mg  100 mg Oral BID Lenward Chancellor, MD   100 mg at 07/27/17 3151  . hydrOXYzine (ATARAX/VISTARIL) tablet 50 mg  50 mg Oral TID PRN Marylin Crosby, MD   50 mg at 07/27/17 0842  . magnesium hydroxide (MILK OF MAGNESIA) suspension 30 mL  30 mL Oral Daily PRN Marylin Crosby, MD   30 mL at 07/27/17 [phone]  . nicotine (NICODERM CQ - dosed in mg/24 hours) patch 21 mg  21 mg Transdermal Daily Pucilowska, Jolanta B, MD   21 mg at 07/27/17 0825  . prazosin (MINIPRESS) capsule 2 mg  2 mg Oral QHS Lenward Chancellor, MD   2 mg at 07/26/17 2143  . risperiDONE (RISPERDAL) tablet 2 mg  2 mg Oral BID Pucilowska, Jolanta B, MD      . traZODone (DESYREL) tablet 100 mg  100 mg Oral QHS Pucilowska, Jolanta B, MD       PTA Medications: Medications Prior to Admission  Medication Sig Dispense Refill Last Dose  . metroNIDAZOLE (FLAGYL) 500 MG tablet Take 1 tablet (500 mg total) by mouth 2 (two) times  daily. (Patient not taking: Reported on 05/19/2017) 14 tablet 0 Completed Course at Unknown time    Patient Stressors:    Patient Strengths:    Treatment Modalities: Medication Management, Group therapy, Case management,  1 to 1 session with clinician, Psychoeducation, Recreational therapy.   Physician Treatment Plan for Primary Diagnosis: Severe recurrent major depressive disorder with psychotic features ([place]) Long Term Goal(s): Improvement in symptoms so as ready for discharge Improvement in symptoms so as ready for discharge   Short Term Goals: Ability to identify changes in lifestyle to reduce recurrence of condition will improve Ability to verbalize feelings will improve Ability to disclose and discuss suicidal ideas Ability to demonstrate self-control will improve Ability to identify and develop effective coping behaviors will improve Ability to maintain clinical measurements within normal limits will improve Compliance with prescribed medications will improve Ability to identify triggers associated with substance abuse/mental health issues will improve Ability to identify changes in lifestyle to reduce recurrence of condition will improve Ability to verbalize feelings will improve Ability to disclose and discuss suicidal ideas Ability to demonstrate self-control will improve Ability to identify and develop effective coping behaviors will improve Ability to maintain clinical measurements within normal limits will improve Compliance with prescribed medications will improve Ability to identify triggers associated with substance abuse/mental health issues will improve  Medication Management: Evaluate patient's response, side effects, and tolerance  of medication regimen.  Therapeutic Interventions: 1 to 1 sessions, Unit Group sessions and Medication administration.  Evaluation of Outcomes: Progressing  Physician Treatment Plan for Secondary Diagnosis: Principal Problem:    Severe recurrent major depressive disorder with psychotic features ([place]) Active Problems:   PTSD (post-traumatic stress disorder)   Alcohol use disorder, moderate, dependence (HCC)   Suicidal ideation   Tobacco use disorder   Cocaine use disorder, moderate, dependence ([place])  Long Term Goal(s): Improvement in symptoms so as ready for discharge Improvement in symptoms so as ready for discharge   Short Term Goals: Ability to identify changes in lifestyle to reduce recurrence of condition will improve Ability to verbalize feelings will improve Ability to disclose and discuss suicidal ideas Ability to demonstrate self-control will improve Ability to identify and develop effective coping behaviors will improve Ability to maintain clinical measurements within normal limits will improve Compliance with prescribed medications will improve Ability to identify triggers associated with substance abuse/mental health issues will improve Ability to identify changes in lifestyle to reduce recurrence of condition will improve Ability to verbalize feelings will improve Ability to disclose and discuss suicidal ideas Ability to demonstrate self-control will improve Ability to identify and develop effective coping behaviors will improve Ability to maintain clinical measurements within normal limits will improve Compliance with prescribed medications will improve Ability to identify triggers associated with substance abuse/mental health issues will improve     Medication Management: Evaluate patient's response, side effects, and tolerance of medication regimen.  Therapeutic Interventions: 1 to 1 sessions, Unit Group sessions and Medication administration.  Evaluation of Outcomes: Progressing   RN Treatment Plan for Primary Diagnosis: Severe recurrent major depressive disorder with psychotic features ([place]) Long Term Goal(s): Knowledge of disease and therapeutic regimen to maintain health will  improve  Short Term Goals: Ability to verbalize feelings will improve, Ability to identify and develop effective coping behaviors will improve and Compliance with prescribed medications will improve  Medication Management: RN will administer medications as ordered by provider, will assess and evaluate patient's response and provide education to patient for prescribed medication. RN will report any adverse and/or side effects to prescribing provider.  Therapeutic Interventions: 1 on 1 counseling sessions, Psychoeducation, Medication administration, Evaluate responses to treatment, Monitor vital signs and CBGs as ordered, Perform/monitor CIWA, COWS, AIMS and Fall Risk screenings as ordered, Perform wound care treatments as ordered.  Evaluation of Outcomes: Progressing   LCSW Treatment Plan for Primary Diagnosis: Severe recurrent major depressive disorder with psychotic features ([place]) Long Term Goal(s): Safe transition to appropriate next level of care at discharge, Engage patient in therapeutic group addressing interpersonal concerns.  Short Term Goals: Engage patient in aftercare planning with referrals and resources, Identify triggers associated with mental health/substance abuse issues and Increase skills for wellness and recovery  Therapeutic Interventions: Assess for all discharge needs, 1 to 1 time with Social worker, Explore available resources and support systems, Assess for adequacy in community support network, Educate family and significant other(s) on suicide prevention, Complete Psychosocial Assessment, Interpersonal group therapy.  Evaluation of Outcomes: Progressing   Progress in Treatment: Attending groups: Yes. Participating in groups: Yes. Taking medication as prescribed: Yes. Toleration medication: Yes. Family/Significant other contact made: No, will contact:    Patient understands diagnosis: Yes. Discussing patient identified problems/goals with staff: Yes. Medical  problems stabilized or resolved: Yes. Denies suicidal/homicidal ideation: Yes. Issues/concerns per patient self-inventory: No. Other:    New problem(s) identified: No, Describe:     New Short Term/Long Term Goal(s):  to get into treatment for alcohol, also to address PTSD symptoms so she can hold a job or follow up with applying for disability.  Discharge Plan or Barriers: TBD, making referrals to Treatment program like [facility] or [facility].   Reason for Continuation of Hospitalization: Anxiety Hallucinations Medication stabilization Other; describe PTSD symptoms  Estimated Length of Stay: 5-7 days   Recreational Therapy: Patient Stressors: Family Patient Goal: Patient will be able to identify at least 5 coping skills by conclusion of recreation therapy tx.   Attendees: Patient:Denise Brandt 07/27/2017 12:55 PM  Physician: Orson Slick, MD 07/27/2017 12:55 PM  Nursing: Polly Cobia, RN 07/27/2017 12:55 PM  RN Care Manager: 07/27/2017 12:55 PM  Social Worker: Dossie Arbour, LCSW 07/27/2017 12:55 PM  Recreational Therapist:  07/27/2017 12:55 PM  Other:  07/27/2017 12:55 PM  Other:  07/27/2017 12:55 PM  Other: 07/27/2017 12:55 PM    Scribe for Treatment Team: August Saucer, LCSW 07/27/2017 12:55 PM

## 2017-07-27 NOTE — Progress Notes (Signed)
[facility] MD Progress Note  07/27/2017 8:16 AM Denise Brandt  MRN:  [id number]  Subjective Denise Brandt met with treatment team today. She is depressed and pretty hopeless hoping for substance abuse treatment, disability and housing. Accepts medications and tolerates them well. Complains of inadequate sleep.  Treatment plan. We continue Celaxa 20 mg, increase Risperdal to 2 mg BID for depression and mood stabilization and add Trazodone for sleep. Monitor for symptoms of alcohol withdrawal.   Social/disposition. She is homeless and uninsured.  Principal Problem: Severe recurrent major depressive disorder with psychotic features ([place]) Diagnosis:   Patient Active Problem List   Diagnosis Date Noted  . Severe recurrent major depressive disorder with psychotic features (Denise Brandt) [F33.3] 07/26/2017    Priority: High  . Alcohol use disorder, moderate, dependence (Denise Brandt) [F10.20] 07/26/2017  . Suicidal ideation [R45.851] 07/26/2017  . Tobacco use disorder [F17.200] 07/26/2017  . Cocaine use disorder, moderate, dependence (Denise Brandt) [F14.20] 07/26/2017  . PTSD (post-traumatic stress disorder) [F43.10] 07/24/2017   Total Time spent with patient: 30 minutes  Past Psychiatric History: depression, binge drinking.  Past Medical History:  Past Medical History:  Diagnosis Date  . Depression   . Ovarian cyst     Past Surgical History:  Procedure Laterality Date  . CESAREAN SECTION     Family History: History reviewed. No pertinent family history. Family Psychiatric  History: none reported Social History:  Social History   Substance and Sexual Activity  Alcohol Use Yes  . Alcohol/week: 2.4 oz  . Types: 4 Cans of beer per week   Comment: daily     Social History   Substance and Sexual Activity  Drug Use No    Social History   Socioeconomic History  . Marital status: Single    Spouse name: None  . Number of children: None  . Years of education: None  . Highest education level: None   Social Needs  . Financial resource strain: None  . Food insecurity - worry: None  . Food insecurity - inability: None  . Transportation needs - medical: None  . Transportation needs - non-medical: None  Occupational History  . None  Tobacco Use  . Smoking status: Current Every Day Smoker    Types: Cigars  . Smokeless tobacco: Never Used  Substance and Sexual Activity  . Alcohol use: Yes    Alcohol/week: 2.4 oz    Types: 4 Cans of beer per week    Comment: daily  . Drug use: No  . Sexual activity: None  Other Topics Concern  . None  Social History Narrative  . None   Additional Social History:                         Sleep: Fair  Appetite:  Fair  Current Medications: Current Facility-Administered Medications  Medication Dose Route Frequency Provider Last Rate Last Dose  . acetaminophen (TYLENOL) tablet 650 mg  650 mg Oral Q6H PRN McNew, Tyson Babinski, MD      . alum & mag hydroxide-simeth (MAALOX/MYLANTA) 200-200-20 MG/5ML suspension 30 mL  30 mL Oral Q4H PRN McNew, Tyson Babinski, MD      . citalopram (CELEXA) tablet 20 mg  20 mg Oral Daily Lenward Chancellor, MD   20 mg at 07/26/17 0827  . docusate sodium (COLACE) capsule 100 mg  100 mg Oral BID Lenward Chancellor, MD   100 mg at 07/26/17 1758  . hydrOXYzine (ATARAX/VISTARIL) tablet 50 mg  50 mg Oral TID PRN  Marylin Crosby, MD   50 mg at 07/25/17 [phone]  . magnesium hydroxide (MILK OF MAGNESIA) suspension 30 mL  30 mL Oral Daily PRN Marylin Crosby, MD   30 mL at 07/27/17 [phone]  . nicotine (NICODERM CQ - dosed in mg/24 hours) patch 21 mg  21 mg Transdermal Daily Zakyah Yanes B, MD   21 mg at 07/26/17 1702  . OLANZapine (ZYPREXA) tablet 5 mg  5 mg Oral Q4H PRN Marylin Crosby, MD   5 mg at 07/25/17 [phone]  . prazosin (MINIPRESS) capsule 2 mg  2 mg Oral QHS Lenward Chancellor, MD   2 mg at 07/26/17 2143  . risperiDONE (RISPERDAL) tablet 1 mg  1 mg Oral QHS Lenward Chancellor, MD   1 mg at 07/26/17 2143  . traZODone (DESYREL)  tablet 100 mg  100 mg Oral QHS PRN McNew, Tyson Babinski, MD   100 mg at 07/26/17 2143    Lab Results:  Results for orders placed or performed during the hospital encounter of 07/24/17 (from the past 48 hour(s))  TSH     Status: None   Collection Time: 07/26/17  6:23 AM  Result Value Ref Range   TSH 4.472 0.350 - 4.500 uIU/mL    Comment: Performed by a 3rd Generation assay with a functional sensitivity of <=0.01 uIU/mL.  T4, free     Status: None   Collection Time: 07/26/17  6:23 AM  Result Value Ref Range   Free T4 0.74 0.61 - 1.12 ng/dL    Comment: (NOTE) Biotin ingestion may interfere with free T4 tests. If the results are inconsistent with the TSH level, previous test results, or the clinical presentation, then consider biotin interference. If needed, order repeat testing after stopping biotin.     Blood Alcohol level:  Lab Results  Component Value Date   ETH 167 (H) 71/69/6789    Metabolic Disorder Labs: Lab Results  Component Value Date   HGBA1C 5.3 07/25/2017   MPG 105.41 07/25/2017   No results found for: PROLACTIN Lab Results  Component Value Date   CHOL 139 07/25/2017   TRIG 110 07/25/2017   HDL 52 07/25/2017   CHOLHDL 2.7 07/25/2017   VLDL 22 07/25/2017   LDLCALC 65 07/25/2017    Physical Findings: AIMS: Facial and Oral Movements Muscles of Facial Expression: None, normal Lips and Perioral Area: None, normal Jaw: None, normal Tongue: None, normal,Extremity Movements Upper (arms, wrists, hands, fingers): None, normal Lower (legs, knees, ankles, toes): None, normal, Trunk Movements Neck, shoulders, hips: None, normal, Overall Severity Severity of abnormal movements (highest score from questions above): None, normal Incapacitation due to abnormal movements: None, normal Patient's awareness of abnormal movements (rate only patient's report): No Awareness, Dental Status Current problems with teeth and/or dentures?: No Does patient usually wear dentures?: No   CIWA:    COWS:     Musculoskeletal: Strength & Muscle Tone: within normal limits Gait & Station: normal Patient leans: N/A  Psychiatric Specialty Exam: Physical Exam  Nursing note and vitals reviewed.   ROS  Blood pressure 101/68, pulse 66, temperature 98.5 F (36.9 C), temperature source Oral, resp. rate 18, height 5' 4"  (1.626 m), weight 73.9 kg (163 lb), unknown if currently breastfeeding.Body mass index is 27.98 kg/m.  General Appearance: Casual  Eye Contact:  Good  Speech:  Clear and Coherent  Volume:  Normal  Mood:  Depressed, Hopeless and Worthless  Affect:  Congruent  Thought Process:  Goal Directed and Descriptions of Associations: Intact  Orientation:  Full (Time, Place, and Person)  Thought Content:  WDL  Suicidal Thoughts:  Yes.  with intent/plan  Homicidal Thoughts:  No  Memory:  Immediate;   Fair Recent;   Fair Remote;   Fair  Judgement:  Impaired  Insight:  Shallow  Psychomotor Activity:  Psychomotor Retardation  Concentration:  Concentration: Fair and Attention Span: Fair  Recall:  [employer] of Knowledge:  Fair  Language:  Fair  Akathisia:  No  Handed:  Right  AIMS (if indicated):     Assets:  Communication Skills Desire for Improvement Physical Health Resilience Social Support  ADL's:  Intact  Cognition:  WNL  Sleep:  Number of Hours: 7.45     Treatment Plan Summary: Daily contact with patient to assess and evaluate symptoms and progress in treatment and Medication management   Ms. Mcroberts is a 36 year old female with a history of depression and substance abuse admitted for suicidal ideation in the context of extreme social stressors.  #Suicidal ideation -patient able to contract for safety in the hospital  #Mood -continue Celexa 20 mg daily -increase Risperdal to 2 mg BID  #Anxiety -continue Minipress 2 mg nightly -continue Vistaril PRN  #Insomnia -continue Trazodone 100 mg standing nightly  #Alcohol abuse/binge  drinking -monitor for symptoms of withrawal -patient not interested in treatment of alcoholism  #Metabolic syndrome monitoring -Lipid panel, TSH and HgnA1C are normal -EKG, Pending -pregnancy test is negative  #Disposition -TBE  Orson Slick, MD 07/27/2017, 8:16 AM

## 2017-07-28 DIAGNOSIS — F333 Major depressive disorder, recurrent, severe with psychotic symptoms: Principal | ICD-10-CM

## 2017-07-28 MED ORDER — HYDROXYZINE HCL 50 MG PO TABS
50.0000 mg | ORAL_TABLET | ORAL | Status: DC | PRN
  Administered 2017-07-29 – 2017-08-03 (×18): 50 mg via ORAL
  Filled 2017-07-28 (×18): qty 1

## 2017-07-28 NOTE — BHH Group Notes (Signed)
07/28/2017 9:30am  Type of Therapy/Topic:  Group Therapy:  Feelings about Diagnosis  Participation Level:  Active   Description of Group:   This group will allow patients to explore their thoughts and feelings about diagnoses they have received. Patients will be guided to explore their level of understanding and acceptance of these diagnoses. Facilitator will encourage patients to process their thoughts and feelings about the reactions of others to their diagnosis and will guide patients in identifying ways to discuss their diagnosis with significant others in their lives. This group will be process-oriented, with patients participating in exploration of their own experiences, giving and receiving support, and processing challenge from other group members.   Therapeutic Goals: 1. Patient will demonstrate understanding of diagnosis as evidenced by identifying two or more symptoms of the disorder 2. Patient will be able to express two feelings regarding the diagnosis 3. Patient will demonstrate their ability to communicate their needs through discussion and/or role play  Summary of Patient Progress:   Actively participated, stayed the entire time. Paetyn says that she experiences "impulstivity and mood swings" as a symptom of her diagnosis. She states that when initially diagnosed she felt "hindered due to this being another obstacle that she has to overcome".  She reports currently being in the acceptance stage. She was unable to identify ways of how she could communicate her needs to other people due to lack of a support system. Mood was good, reality based thinking.      Therapeutic Modalities:   Cognitive Behavioral Therapy Brief Therapy Feelings Identification    Darin Engels, LCSW 07/28/2017 10:33 AM

## 2017-07-28 NOTE — Progress Notes (Signed)
Patient attended the spiritual group that the chaplain led today. The topic of discussion was community and spirituality. Pt contributed to the discussion and pleasantly talked about how the community has accepted her and help her. Pt stated that he found hope in this place and have learned a lot about herself. Pt stated she is working through some of her struggles with the help of the medical team. Pt was thankful to have the support of other patient who shared similar struggles like hers. Pt was calm and respectful as she share hear fears and hope with others. Chaplain provided support and guided the group discussion.   07/28/17 1600  Clinical Encounter Type  Visited With Patient  Visit Type Initial;Spiritual support  Referral From Chaplain  Consult/Referral To Chaplain  Spiritual Encounters  Spiritual Needs Other (Comment)

## 2017-07-28 NOTE — Progress Notes (Signed)
Recreation Therapy Notes  INPATIENT RECREATION THERAPY ASSESSMENT  Patient Details Name: Dyane Broberg MRN: [id number] DOB: 07-Feb-1981 Today's Date: 07/28/2017  Patient Stressors: Family  Coping Skills:   Isolate, Substance Abuse, Avoidance, Exercise  Personal Challenges: Anger, Communication, Concentration, Decision-Making, Expressing Yourself, Relationships, Social Interaction, Stress Management, Substance Abuse, Trusting Others  Leisure Interests (2+):  Individual - Writing, Individual - Reading, Social - Family  Awareness of Community Resources:  Yes  Community Resources:  [employer], [place], [place], Art therapist  Current Use: Yes  If no, Barriers?:    Patient Strengths:  Love children, inspirational, intuitive  Patient Identified Areas of Improvement:  My mental health and coping skills and communicating  Current Recreation Participation:  Exercise  Patient Goal for Hospitalization:  To get a clear mind and find a place to stay and stay on medication.   [place] of Residence:  [place] of Residence:  [place]   Current [place] (including self-harm):  No  Current [place]:  Yes  Consent to Intern Participation: N/A   Rosario Kushner 07/28/2017, 2:43 PM

## 2017-07-28 NOTE — Progress Notes (Signed)
Recreation Therapy Notes  Date: 12.11.2018  Time: 1:00pm   Location: Craft Room  Behavioral response: Appropriate  Intervention Topic: Self-esteem  Discussion/Intervention: Group content todays was focused on self-esteem. The group defined self-esteem and where it comes from. Individual described why self-esteem is important and positive ways to improve self- esteem. Patients identified what normally impacts their self-esteem and things that contribute to self-esteem. The group participated in the intervention "Collage of you" and found positive way to represent them-selves. Clinical Observations/Feedback:  Patient came to group and was focused on what her peers and staff had to say about the topic at hand. Individual participated in the intervention and was social with staff and peers during group.  Gaylord Seydel LRT/CTRS         Nonnie Pickney 07/28/2017 2:16 PM
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
## 2017-07-28 NOTE — BHH Group Notes (Signed)
LCSW Group Therapy Note 07/28/2017 9:00am  Type of Therapy and Topic:  Group Therapy:  Setting Goals  Participation Level:  Minimal  Description of Group: In this process group, patients discussed using strengths to work toward goals and address challenges.  Patients identified two positive things about themselves and one goal they were working on.  Patients were given the opportunity to share openly and support each other's plan for self-empowerment.  The group discussed the value of gratitude and were encouraged to have a daily reflection of positive characteristics or circumstances.  Patients were encouraged to identify a plan to utilize their strengths to work on current challenges and goals.  Therapeutic Goals 1. Patient will verbalize personal strengths/positive qualities and relate how these can assist with achieving desired personal goals 2. Patients will verbalize affirmation of peers plans for personal change and goal setting 3. Patients will explore the value of gratitude and positive focus as related to successful achievement of goals 4. Patients will verbalize a plan for regular reinforcement of personal positive qualities and circumstances.  Summary of Patient Progress:  Pt able to meet therapeutic goals.  Reserved, but shared that her goal for the day was to "communicate with her family"    Therapeutic Modalities Cognitive Behavioral Corinne, LCSW 07/28/2017 4:38 PM
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
## 2017-07-28 NOTE — Progress Notes (Signed)
Patient ID: Denise Brandt, female   DOB: 10/26/1980, 36 y.o.   MRN: [id number] CSW met with Pt, provided [place] [facility] list as Pt declines residential substance abuse treatment.  She verbalizes having appointments to attend in [place] at discharge for child support, etc.  CSW provided [facility] #, Child support office #, and [facility] [facility] list.  Pt agreed that her back up plan would be to follow up at [facility] and stay with a friend in [place] if she was unable to go to an [employer] in [place]. She signed a release for [place] as she hopes she will be in [place] area.  Dossie Arbour, LCSW

## 2017-07-29 ENCOUNTER — Inpatient Hospital Stay: Payer: No Typology Code available for payment source

## 2017-07-29 MED ORDER — TRAZODONE HCL 50 MG PO TABS
150.0000 mg | ORAL_TABLET | Freq: Every day | ORAL | Status: DC
  Administered 2017-07-29 – 2017-07-31 (×3): 150 mg via ORAL
  Filled 2017-07-29 (×3): qty 1

## 2017-07-29 MED ORDER — TEMAZEPAM 15 MG PO CAPS: 15.0000 mg | ORAL_CAPSULE | Freq: Every evening | ORAL | Status: DC | PRN
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
## 2017-07-29 NOTE — Progress Notes (Signed)
Patient ID: Denise Brandt, female   DOB: 24-Feb-1981, 36 y.o.   MRN: [id number] "I was having a good dream when you woke me up; I cooked Thanksgiving Dinner for everybody here, my father was there, I had a whole sweet potatoes pie to my self with stuffed [place], I was about to take a bite and then you woke me up..." A&Ox3, denied pain, pleasant, evasive and short to the point, "life is hard"; denied SI/HI/AVH.

## 2017-07-29 NOTE — Plan of Care (Signed)
Patient slept for Estimated Hours of 7.45; Precautionary checks every 15 minutes for safety maintained, room free of safety hazards, patient sustains no injury or falls during this shift.  

## 2017-07-29 NOTE — Progress Notes (Signed)
Received Denise Brandt this am after breakfsast, she was compliant with her medications and requested Vistaril for her "extreme anxiety." She endorsed feeling anxious, depressed and passive suicidal ideations. She denied hearing voices. She has been visible at intervals in the milieu and for her meals.

## 2017-07-29 NOTE — Progress Notes (Signed)
Recreation Therapy Notes   Date: 12.12.2018  Time: 1:00pm   Location: Craft Room  Behavioral response: Appropriate  Intervention Topic: Coping Skills  Discussion/Intervention: Group content on today was focused on coping skills. The group defined what coping skills are and when they can be used. Individuals described how they normally cope with thing and the coping skills they normally use. Patients expressed why it is important to cope with things and how not coping with things can affect you. The group participated in the intervention "My coping box" and made coping boxes while adding coping skills they could use in the future to the box. Clinical Observations/Feedback:  Patient came to group late due to unknown reasons. She participated in the intervention and was social with her peers and staff during group.  Kolby Myung LRT/CTRS         Hebert Dooling 07/29/2017 2:08 PM

## 2017-07-29 NOTE — BHH Group Notes (Signed)
  07/29/2017  Time: 0930  Type of Therapy/Topic:  Group Therapy:  Emotion Regulation  Participation Level:  Did Not Attend   Description of Group:    The purpose of this group is to assist patients in learning to regulate negative emotions and experience positive emotions. Patients will be guided to discuss ways in which they have been vulnerable to their negative emotions. These vulnerabilities will be juxtaposed with experiences of positive emotions or situations, and patients will be challenged to use positive emotions to combat negative ones. Special emphasis will be placed on coping with negative emotions in conflict situations, and patients will process healthy conflict resolution skills.  Therapeutic Goals: 1. Patient will identify two positive emotions or experiences to reflect on in order to balance out negative emotions 2. Patient will label two or more emotions that they find the most difficult to experience 3. Patient will demonstrate positive conflict resolution skills through discussion and/or role plays  Summary of Patient Progress: Pt was invited to attend group but chose not to attend. CSW will continue to encourage pt to attend group throughout their admission.    Therapeutic Modalities:   Cognitive Behavioral Therapy Feelings Identification Dialectical Behavioral Therapy   Alden Hipp, MSW, LCSW 07/29/2017 10:28 AM

## 2017-07-30 LAB — PREGNANCY, URINE: PREG TEST UR: NEGATIVE
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
## 2017-07-30 NOTE — Plan of Care (Signed)
Patient slept for Estimated Hours of 6.30; Precautionary checks every 15 minutes for safety maintained, room free of safety hazards, patient sustains no injury or falls during this shift.  

## 2017-07-30 NOTE — BHH Group Notes (Signed)
[facility] Group Notes:  (Nursing/MHT/Case Management/Adjunct)  Date:  07/30/2017  Time:  3:22 PM  Type of Therapy:  Psychoeducational Skills  Participation Level:  Active  Participation Quality:  Appropriate, Attentive and Sharing  Affect:  Appropriate  Cognitive:  Alert, Appropriate and Oriented  Insight:  Appropriate  Engagement in Group:  Engaged  Modes of Intervention:  Education, Socialization and Support  Summary of Progress/Problems:  Denise Brandt 07/30/2017, 3:22 PM

## 2017-07-30 NOTE — BHH Group Notes (Signed)
LCSW Group Therapy Note 07/30/2017 9:00AM  Type of Therapy and Topic:  Group Therapy:  Setting Goals  Participation Level:  Active  Description of Group: In this process group, patients discussed using strengths to work toward goals and address challenges.  Patients identified two positive things about themselves and one goal they were working on.  Patients were given the opportunity to share openly and support each other's plan for self-empowerment.  The group discussed the value of gratitude and were encouraged to have a daily reflection of positive characteristics or circumstances.  Patients were encouraged to identify a plan to utilize their strengths to work on current challenges and goals.  Therapeutic Goals 1. Patient will verbalize personal strengths/positive qualities and relate how these can assist with achieving desired personal goals 2. Patients will verbalize affirmation of peers plans for personal change and goal setting 3. Patients will explore the value of gratitude and positive focus as related to successful achievement of goals 4. Patients will verbalize a plan for regular reinforcement of personal positive qualities and circumstances.  Summary of Patient Progress: Hadasah was able to actively participate in goals group.  She shared that he understood what it meant to develop SMART goals and identified that her goal for today would be to contact and talk with her daughter.      Therapeutic Modalities Cognitive Behavioral Therapy Motivational Interviewing    Devona Konig, Marlinda Mike 07/30/2017 12:43 PM

## 2017-07-30 NOTE — Progress Notes (Signed)
Patient ID: Denise Brandt, female   DOB: September 25, 1980, 36 y.o.   MRN: [id number] Mood and affect appropriate, no unusual behavior, c/o 8/10 HA, Tylenol 650 mg PO PRN given, BP suitable for minipress, denied SI/HI/AVH.

## 2017-07-30 NOTE — Progress Notes (Signed)
Patient ID: Denise Brandt, female   DOB: 1981/04/20, 36 y.o.   MRN: [id number] CSW checked in with pt to verify if she was still interested in pursuing a bed with [facility].  CSW inquired if pt had contacted someone at the [facility] about an open bed and if she had completed the application yet.  Pt informed CSW that she had not followed up with anyone with [facility] nor completed an application.  CSW gave pt a copy of the [facility] application and obtained consent to fax the completed application to Rubye Oaks at [phone] who will give CSW information about bed openings once she receives the completed application.  CSW also informed pt that there was an open bed for a female with [facility] in [place], [place] if she was interested in pursuing [facility] residential tx services.  Pt shared that she was not sure if she wanted to pursue [facility] residential tx services at this time, but she accepted information about [facility] and their programs.  Pt informed CSW that she would complete the [facility] application by the end of the day, but she really wanted to give herself a few days to think about other options.  Pt informed CSW that she was waiting to get her belongings from the [facility] in [place], [place] which may take 2 days.  CSW informed pt that she would check back in with her before the end of the day to get the [facility] application.  CSW informed pt that it may take a few days to get information back about open beds and completing other required documentation may also take awhile to do so it was imperative that we start the process as soon as possible.  CSW will continue to seek appropriate referrrals for pt.

## 2017-07-30 NOTE — Progress Notes (Signed)
D- Patient alert and oriented. Patient presents in a sad/depressed and anxious mood on assessment. Patient states that she didn't sleep at all last night. Patient reports her depression as a "8/10 because it's getting close to the holidays" and rates her anxiety level as a "7/10 because neither daughter has called me". Patient endorses wanting "to cut myself to let out some of this energy" but patient does agree not to harm herself and will come to staff if anything changes. Patient denies HI and AVH, and pain at this time.  A- Scheduled medications administered to patient, per MD orders. Support and encouragement provided.  Routine safety checks conducted every 15 minutes.  Patient informed to notify staff with problems or concerns.  R- No adverse drug reactions noted. Patient contracts for safety at this time. Patient compliant with medications and treatment plan. Patient receptive, calm, and cooperative. Patient interacts well with others on the unit.  Patient remains safe at this time.
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
## 2017-07-30 NOTE — [facility] Group Notes (Signed)
[facility] LCSW Group Therapy Note  Date/Time: 07/30/17, 0930  Type of Therapy/Topic:  Group Therapy:  Balance in Life  Participation Level:  active  Description of Group:    This group will address the concept of balance and how it feels and looks when one is unbalanced. Patients will be encouraged to process areas in their lives that are out of balance, and identify reasons for remaining unbalanced. Facilitators will guide patients utilizing problem- solving interventions to address and correct the stressor making their life unbalanced. Understanding and applying boundaries will be explored and addressed for obtaining  and maintaining a balanced life. Patients will be encouraged to explore ways to assertively make their unbalanced needs known to significant others in their lives, using other group members and facilitator for support and feedback.  Therapeutic Goals: 1. Patient will identify two or more emotions or situations they have that consume much of in their lives. 2. Patient will identify signs/triggers that life has become out of balance:  3. Patient will identify two ways to set boundaries in order to achieve balance in their lives:  4. Patient will demonstrate ability to communicate their needs through discussion and/or role plays  Summary of Patient Progress:Pt identified substance use, mental/emotional, and family relationships as areas that are out of balance in her life.  Pt shared a lot with the group regarding feeling rejected by her family due to her substance abuse problems and was quite emotional in sharing.  Good participation in group discussion regarding positive ways to address out of balance areas of life.          Therapeutic Modalities:   Cognitive Behavioral Therapy Solution-Focused Therapy Assertiveness Training  Lurline Idol, [place]

## 2017-07-30 NOTE — Progress Notes (Signed)
Recreation Therapy Notes  Date: 12.13.2018  Time: 1:00pm  Location: Craft Room  Behavioral response: Appropriate  Intervention Topic: Anger Management  Discussion/Intervention: Group content on today was focused on anger management. The group defined anger and reasons they become angry. Individuals expressed negative way they have dealt with anger in the past. Patients stated some positive ways they could deal with anger in the future. The group described how anger can affect your health and daily plans. Individuals participated in the intervention "Score your anger" where they had a chance to answer questions about themselves and get a score of their anger.  Clinical Observations/Feedback:  Patient came to group late due to unknown reasons. Individual participated in the intervention and was social with her peers and staff.  Kaneisha Ellenberger LRT/CTRS         Alyshia Kernan 07/30/2017 2:36 PM

## 2017-07-30 NOTE — Progress Notes (Signed)
Recreation Therapy Notes  Date: 12.13.2018  Time: 3:00pm  Location: Craft room  Behavioral response: Appropriate  Group Type: Art/Craft  Participation level: Active  Communication: Patient was social with peers and staff.  Comments: Patient came to group late due to unknown reason.  Lucinda Spells LRT/CTRS        Voncile Schwarz 07/30/2017 4:05 PM

## 2017-07-31 MED ORDER — SENNA 8.6 MG PO TABS
1.0000 | ORAL_TABLET | Freq: Once | ORAL | Status: DC | PRN
Start: 2017-07-31 — End: 2017-08-03

## 2017-07-31 NOTE — Progress Notes (Signed)
[facility] MD Progress Note  07/31/2017 11:39 AM Denise Brandt  MRN:  [id number] Subjective:  Pt states that she is doing okay. She is feeling more clear minded. She is still anxious about relapsing on alcohol and drugs but has been trying to think positively about the future. She will be getting her kids back at the end of the school year and her fiance is getting out of prison in April so she wants to focus on herself until then. She really wants to get into an [place] with CSW is trying to help her with. She denies SI or thoughts of self harm. She spoke with her daughter last night which made her feel good. Pt is concerned that she is pregnant because "my stomach is getting bigger." Abdominal exam negative for any pain or swelling. She does feel bloated and constipated. She feels like her medications are helping a lot with symptoms.    Principal Problem: Severe recurrent major depressive disorder with psychotic features ([place]) Diagnosis:   Patient Active Problem List   Diagnosis Date Noted  . Severe recurrent major depressive disorder with psychotic features ([place]) [F33.3] 07/26/2017  . Alcohol use disorder, moderate, dependence ([place]) [F10.20] 07/26/2017  . Suicidal ideation [R45.851] 07/26/2017  . Tobacco use disorder [F17.200] 07/26/2017  . Cocaine use disorder, moderate, dependence ([place]) [F14.20] 07/26/2017  . PTSD (post-traumatic stress disorder) [F43.10] 07/24/2017   Total Time spent with patient: 20 minutes  Past Psychiatric History: See H&P  Past Medical History:  Past Medical History:  Diagnosis Date  . Depression   . Ovarian cyst     Past Surgical History:  Procedure Laterality Date  . CESAREAN SECTION     Family History: History reviewed. No pertinent family history. Family Psychiatric  History: See H&P Social History:  Social History   Substance and Sexual Activity  Alcohol Use Yes  . Alcohol/week: 2.4 oz  . Types: 4 Cans of beer per week   Comment: daily    Social History   Substance and Sexual Activity  Drug Use No    Social History   Socioeconomic History  . Marital status: Single    Spouse name: None  . Number of children: None  . Years of education: None  . Highest education level: None  Social Needs  . Financial resource strain: None  . Food insecurity - worry: None  . Food insecurity - inability: None  . Transportation needs - medical: None  . Transportation needs - non-medical: None  Occupational History  . None  Tobacco Use  . Smoking status: Current Every Day Smoker    Types: Cigars  . Smokeless tobacco: Never Used  Substance and Sexual Activity  . Alcohol use: Yes    Alcohol/week: 2.4 oz    Types: 4 Cans of beer per week    Comment: daily  . Drug use: No  . Sexual activity: None  Other Topics Concern  . None  Social History Narrative  . None   Additional Social History:                         Sleep: Good  Appetite:  Fair  Current Medications: Current Facility-Administered Medications  Medication Dose Route Frequency Provider Last Rate Last Dose  . acetaminophen (TYLENOL) tablet 650 mg  650 mg Oral Q6H PRN Marylin Crosby, MD   650 mg at 07/29/17 2006  . alum & mag hydroxide-simeth (MAALOX/MYLANTA) 200-200-20 MG/5ML suspension 30 mL  30 mL  Oral Q4H PRN Aubrey Voong, Tyson Babinski, MD      . citalopram (CELEXA) tablet 20 mg  20 mg Oral Daily Lenward Chancellor, MD   20 mg at 07/31/17 5916  . docusate sodium (COLACE) capsule 100 mg  100 mg Oral BID Lenward Chancellor, MD   100 mg at 07/31/17 3846  . hydrOXYzine (ATARAX/VISTARIL) tablet 50 mg  50 mg Oral Q4H PRN Marylin Crosby, MD   50 mg at 07/31/17 6599  . magnesium hydroxide (MILK OF MAGNESIA) suspension 30 mL  30 mL Oral Daily PRN Marylin Crosby, MD   30 mL at 07/29/17 0840  . nicotine (NICODERM CQ - dosed in mg/24 hours) patch 21 mg  21 mg Transdermal Daily Pucilowska, Jolanta B, MD   21 mg at 07/31/17 3570  . prazosin (MINIPRESS) capsule 2 mg  2 mg Oral QHS  Lenward Chancellor, MD   2 mg at 07/30/17 2127  . risperiDONE (RISPERDAL) tablet 2 mg  2 mg Oral BID Pucilowska, Jolanta B, MD   2 mg at 07/31/17 1779  . traZODone (DESYREL) tablet 150 mg  150 mg Oral QHS Maryjane Benedict, Tyson Babinski, MD   150 mg at 07/30/17 2127    Lab Results:  Results for orders placed or performed during the hospital encounter of 07/24/17 (from the past 48 hour(s))  Pregnancy, urine     Status: None   Collection Time: 07/30/17  5:46 PM  Result Value Ref Range   Preg Test, Ur NEGATIVE NEGATIVE    Blood Alcohol level:  Lab Results  Component Value Date   ETH 167 (H) 39/10/90    Metabolic Disorder Labs: Lab Results  Component Value Date   HGBA1C 5.3 07/25/2017   MPG 105.41 07/25/2017   No results found for: PROLACTIN Lab Results  Component Value Date   CHOL 139 07/25/2017   TRIG 110 07/25/2017   HDL 52 07/25/2017   CHOLHDL 2.7 07/25/2017   VLDL 22 07/25/2017   LDLCALC 65 07/25/2017    Physical Findings: AIMS: Facial and Oral Movements Muscles of Facial Expression: None, normal Lips and Perioral Area: None, normal Jaw: None, normal Tongue: None, normal,Extremity Movements Upper (arms, wrists, hands, fingers): None, normal Lower (legs, knees, ankles, toes): None, normal, Trunk Movements Neck, shoulders, hips: None, normal, Overall Severity Severity of abnormal movements (highest score from questions above): None, normal Incapacitation due to abnormal movements: None, normal Patient's awareness of abnormal movements (rate only patient's report): No Awareness, Dental Status Current problems with teeth and/or dentures?: No Does patient usually wear dentures?: No  CIWA:    COWS:     Musculoskeletal: Strength & Muscle Tone: within normal limits Gait & Station: normal Patient leans: N/A  Psychiatric Specialty Exam: Physical Exam  ROS  Blood pressure 98/67, pulse 67, temperature 98.1 F (36.7 C), temperature source Oral, resp. rate 18, height 5\' 4"  (1.626 m),  weight 73.9 kg (163 lb), SpO2 100 %, unknown if currently breastfeeding.Body mass index is 27.98 kg/m.  General Appearance: Casual  Eye Contact:  Good  Speech:  Clear and Coherent  Volume:  Normal  Mood:  Depressed but improving  Affect:  Congruent  Thought Process:  Coherent and Goal Directed  Orientation:  Full (Time, Place, and Person)  Thought Content:  Negative  Suicidal Thoughts:  No  Homicidal Thoughts:  No  Memory:  Immediate;   Fair  Judgement:  Fair  Insight:  Fair  Psychomotor Activity:  NA  Concentration:  Concentration: Fair  Recall:  Fair  Fund of Knowledge:  Fair  Language:  Fair  Akathisia:  No      Assets:  Communication Skills Desire for Improvement Resilience  ADL's:  Intact  Cognition:  WNL  Sleep:  Number of Hours: 6.15     Treatment Plan Summary: 36 yo female admitted due to SI and polysubstance abuse. Mood and SI are improving. She is motivated for sobriety.   Plan:  Mood disorder -Continue Risperdal 2 mg BID -Continue Celexa 20 mg daily  Nightmares -Continue Prazosin 2 mg qhs  Headache -Resolving -CT head negative  Anxiety -prn hydroxyzine  Polysubstance abuse -CSW helping to look into [place] [facility]  Dispo -Pt is homeless and does not have a support system. CSW looking into [place] houses  Marylin Crosby, MD 07/31/2017, 11:39 AM

## 2017-07-31 NOTE — Progress Notes (Signed)
Received Denise Brandt this AM after breakkfast, she was compliant with her medications. She reported feeling anxious and related there depression to not being able to talk with her children this morning. She is considering Rehab after discharge to prevent relapse on drugs. She is awaare her struggle will start once she is discharge from the hospital. She has been OOB in the milieu for her meals. No change in her status this PM.

## 2017-07-31 NOTE — Progress Notes (Addendum)
Pt is alert and oriented this evening. Pt requesting access to belongings for phone numbers which was permitted. Pt is somewhat anxious but denies SI at this time and is pleasant and cooperative with staff and taking medications as prescribed. Pt also reporting some anxiety and insomnia at midnight. PRN medication provided.

## 2017-07-31 NOTE — [facility] Group Notes (Signed)
  07/31/2017  Time: 0930  Type of Therapy and Topic:  Group Therapy:  Feelings around Relapse and Recovery  Participation Level:  Did Not Attend   Description of Group:    Patients in this group will discuss emotions they experience before and after a relapse. They will process how experiencing these feelings, or avoidance of experiencing them, relates to having a relapse. Facilitator will guide patients to explore emotions they have related to recovery. Patients will be encouraged to process which emotions are more powerful. They will be guided to discuss the emotional reaction significant others in their lives may have to their relapse or recovery. Patients will be assisted in exploring ways to respond to the emotions of others without this contributing to a relapse.  Therapeutic Goals: 1. Patient will identify two or more emotions that lead to a relapse for them 2. Patient will identify two emotions that result when they relapse 3. Patient will identify two emotions related to recovery 4. Patient will demonstrate ability to communicate their needs through discussion and/or role plays   Summary of Patient Progress: Pt was invited to attend group but chose not to attend. CSW will continue to encourage pt to attend group throughout their admission.      Therapeutic Modalities:   Cognitive Behavioral Therapy Solution-Focused Therapy Assertiveness Training Relapse Prevention Therapy   Alden Hipp, MSW, LCSW 07/31/2017 10:26 AM

## 2017-07-31 NOTE — Progress Notes (Signed)
Recreation Therapy Notes  Date: 12.14.2018  Time: 1pm  Location: Craft Room  Behavioral response: N/A  Intervention Topic: Goals  Discussion/Intervention: Patient did not attend group. Clinical Observations/Feedback:  Patient did not attend group.  Coner Gibbard LRT/CTRS          Bart Ashford 07/31/2017 1:25 PM

## 2017-07-31 NOTE — Tx Team (Signed)
Interdisciplinary Treatment and Diagnostic Plan Update  07/31/2017 Time of Session: 11:15 am Denise Brandt MRN: [id number]  Principal Diagnosis: Severe recurrent major depressive disorder with psychotic features [facility])  Secondary Diagnoses: Principal Problem:   Severe recurrent major depressive disorder with psychotic features ([place]) Active Problems:   PTSD (post-traumatic stress disorder)   Alcohol use disorder, moderate, dependence (HCC)   Suicidal ideation   Tobacco use disorder   Cocaine use disorder, moderate, dependence ([place])   Current Medications:  Current Facility-Administered Medications  Medication Dose Route Frequency Provider Last Rate Last Dose  . acetaminophen (TYLENOL) tablet 650 mg  650 mg Oral Q6H PRN Marylin Crosby, MD   650 mg at 07/29/17 2006  . alum & mag hydroxide-simeth (MAALOX/MYLANTA) 200-200-20 MG/5ML suspension 30 mL  30 mL Oral Q4H PRN McNew, Tyson Babinski, MD      . citalopram (CELEXA) tablet 20 mg  20 mg Oral Daily Lenward Chancellor, MD   20 mg at 07/31/17 2423  . docusate sodium (COLACE) capsule 100 mg  100 mg Oral BID Lenward Chancellor, MD   100 mg at 07/31/17 5361  . hydrOXYzine (ATARAX/VISTARIL) tablet 50 mg  50 mg Oral Q4H PRN Marylin Crosby, MD   50 mg at 07/31/17 4431  . magnesium hydroxide (MILK OF MAGNESIA) suspension 30 mL  30 mL Oral Daily PRN Marylin Crosby, MD   30 mL at 07/29/17 0840  . nicotine (NICODERM CQ - dosed in mg/24 hours) patch 21 mg  21 mg Transdermal Daily Pucilowska, Jolanta B, MD   21 mg at 07/31/17 5400  . prazosin (MINIPRESS) capsule 2 mg  2 mg Oral QHS Lenward Chancellor, MD   2 mg at 07/30/17 2127  . risperiDONE (RISPERDAL) tablet 2 mg  2 mg Oral BID Pucilowska, Jolanta B, MD   2 mg at 07/31/17 8676  . senna (SENOKOT) tablet 8.6 mg  1 tablet Oral Once PRN McNew, Tyson Babinski, MD      . traZODone (DESYREL) tablet 150 mg  150 mg Oral QHS Marylin Crosby, MD   150 mg at 07/30/17 2127   PTA Medications: Medications Prior to Admission   Medication Sig Dispense Refill Last Dose  . metroNIDAZOLE (FLAGYL) 500 MG tablet Take 1 tablet (500 mg total) by mouth 2 (two) times daily. (Patient not taking: Reported on 05/19/2017) 14 tablet 0 Completed Course at Unknown time    Patient Stressors:    Patient Strengths:    Treatment Modalities: Medication Management, Group therapy, Case management,  1 to 1 session with clinician, Psychoeducation, Recreational therapy.   Physician Treatment Plan for Primary Diagnosis: Severe recurrent major depressive disorder with psychotic features ([place]) Long Term Goal(s): Improvement in symptoms so as ready for discharge Improvement in symptoms so as ready for discharge   Short Term Goals: Ability to identify changes in lifestyle to reduce recurrence of condition will improve Ability to verbalize feelings will improve Ability to disclose and discuss suicidal ideas Ability to demonstrate self-control will improve Ability to identify and develop effective coping behaviors will improve Ability to maintain clinical measurements within normal limits will improve Compliance with prescribed medications will improve Ability to identify triggers associated with substance abuse/mental health issues will improve Ability to identify changes in lifestyle to reduce recurrence of condition will improve Ability to verbalize feelings will improve Ability to disclose and discuss suicidal ideas Ability to demonstrate self-control will improve Ability to identify and develop effective coping behaviors will improve Ability to maintain clinical measurements within normal  limits will improve Compliance with prescribed medications will improve Ability to identify triggers associated with substance abuse/mental health issues will improve  Medication Management: Evaluate patient's response, side effects, and tolerance of medication regimen.  Therapeutic Interventions: 1 to 1 sessions, Unit Group sessions and  Medication administration.  Evaluation of Outcomes: Progressing  Physician Treatment Plan for Secondary Diagnosis: Principal Problem:   Severe recurrent major depressive disorder with psychotic features ([place]) Active Problems:   PTSD (post-traumatic stress disorder)   Alcohol use disorder, moderate, dependence (HCC)   Suicidal ideation   Tobacco use disorder   Cocaine use disorder, moderate, dependence ([place])  Long Term Goal(s): Improvement in symptoms so as ready for discharge Improvement in symptoms so as ready for discharge   Short Term Goals: Ability to identify changes in lifestyle to reduce recurrence of condition will improve Ability to verbalize feelings will improve Ability to disclose and discuss suicidal ideas Ability to demonstrate self-control will improve Ability to identify and develop effective coping behaviors will improve Ability to maintain clinical measurements within normal limits will improve Compliance with prescribed medications will improve Ability to identify triggers associated with substance abuse/mental health issues will improve Ability to identify changes in lifestyle to reduce recurrence of condition will improve Ability to verbalize feelings will improve Ability to disclose and discuss suicidal ideas Ability to demonstrate self-control will improve Ability to identify and develop effective coping behaviors will improve Ability to maintain clinical measurements within normal limits will improve Compliance with prescribed medications will improve Ability to identify triggers associated with substance abuse/mental health issues will improve     Medication Management: Evaluate patient's response, side effects, and tolerance of medication regimen.  Therapeutic Interventions: 1 to 1 sessions, Unit Group sessions and Medication administration.  Evaluation of Outcomes: Progressing   RN Treatment Plan for Primary Diagnosis: Severe recurrent major  depressive disorder with psychotic features ([place]) Long Term Goal(s): Knowledge of disease and therapeutic regimen to maintain health will improve  Short Term Goals: Ability to verbalize feelings will improve, Ability to identify and develop effective coping behaviors will improve and Compliance with prescribed medications will improve  Medication Management: RN will administer medications as ordered by provider, will assess and evaluate patient's response and provide education to patient for prescribed medication. RN will report any adverse and/or side effects to prescribing provider.  Therapeutic Interventions: 1 on 1 counseling sessions, Psychoeducation, Medication administration, Evaluate responses to treatment, Monitor vital signs and CBGs as ordered, Perform/monitor CIWA, COWS, AIMS and Fall Risk screenings as ordered, Perform wound care treatments as ordered.  Evaluation of Outcomes: Progressing   LCSW Treatment Plan for Primary Diagnosis: Severe recurrent major depressive disorder with psychotic features ([place]) Long Term Goal(s): Safe transition to appropriate next level of care at discharge, Engage patient in therapeutic group addressing interpersonal concerns.  Short Term Goals: Engage patient in aftercare planning with referrals and resources, Identify triggers associated with mental health/substance abuse issues and Increase skills for wellness and recovery  Therapeutic Interventions: Assess for all discharge needs, 1 to 1 time with Social worker, Explore available resources and support systems, Assess for adequacy in community support network, Educate family and significant other(s) on suicide prevention, Complete Psychosocial Assessment, Interpersonal group therapy.  Evaluation of Outcomes: Progressing   Progress in Treatment: Attending groups: Yes. Participating in groups: Yes. Taking medication as prescribed: Yes. Toleration medication: Yes. Family/Significant other contact  made: No, will contact:  Pt declined allowing CSW to contact support person. Patient understands diagnosis: Yes. Discussing patient identified  problems/goals with staff: Yes. Medical problems stabilized or resolved: Yes. Denies suicidal/homicidal ideation: Yes. Issues/concerns per patient self-inventory: No. Other:    New problem(s) identified: No, Describe:     New Short Term/Long Term Goal(s): to get into treatment for alcohol, also to address PTSD symptoms so she can hold a job or follow up with applying for disability.  Discharge Plan or Barriers: no medical insurance;  Having no available beds at [facility] Treatment programs like [facility] or [facility].   Reason for Continuation of Hospitalization: Anxiety Hallucinations Medication stabilization Other; describe PTSD symptoms  Estimated Length of Stay: 5-7 days   Recreational Therapy: Patient Stressors: Family Patient Goal: Patient will be able to identify at least 5 coping skills by conclusion of recreation therapy tx.   Attendees: Patient: 07/31/2017 3:58 PM  Physician: Amador Cunas, MD 07/31/2017 3:58 PM  Nursing:  07/31/2017 3:58 PM  RN Care Manager: 07/31/2017 3:58 PM  Social Worker: Derrek Gu, LCSW 07/31/2017 3:58 PM  Recreational Therapist: Roanna Epley, LRT 07/31/2017 3:58 PM  Other:  07/31/2017 3:58 PM  Other:  07/31/2017 3:58 PM  Other: 07/31/2017 3:58 PM    Scribe for Treatment Team: Devona Konig, LCSW 07/31/2017 3:58 PM

## 2017-08-01 MED ORDER — CITALOPRAM HYDROBROMIDE 20 MG PO TABS
40.0000 mg | ORAL_TABLET | Freq: Every day | ORAL | Status: DC
  Administered 2017-08-02 – 2017-08-03 (×2): 40 mg via ORAL
  Filled 2017-08-01 (×2): qty 2

## 2017-08-01 MED ORDER — TRAZODONE HCL 100 MG PO TABS
200.0000 mg | ORAL_TABLET | Freq: Every day | ORAL | Status: DC
  Administered 2017-08-01 – 2017-08-02 (×2): 200 mg via ORAL
  Filled 2017-08-01 (×2): qty 2

## 2017-08-01 NOTE — [facility] Group Notes (Signed)
08/01/2017 1:15pm  Type of Therapy and Topic: Group Therapy: Feelings Around Returning Home & Establishing a Supportive Framework and Supporting Oneself When Supports Not Available  Participation Level: Active  Description of Group:  Patients first processed thoughts and feelings about upcoming discharge. These included fears of upcoming changes, lack of change, new living environments, judgements and expectations from others and overall stigma of mental health issues. The group then discussed the definition of a supportive framework, what that looks and feels like, and how do to discern it from an unhealthy non-supportive network. The group identified different types of supports as well as what to do when your family/friends are less than helpful or unavailable  Therapeutic Goals  1. Patient will identify one healthy supportive network that they can use at discharge. 2. Patient will identify one factor of a supportive framework and how to tell it from an unhealthy network. 3. Patient able to identify one coping skill to use when they do not have positive supports from others. 4. Patient will demonstrate ability to communicate their needs through discussion and/or role plays.  Summary of Patient Progress: Pt engaged during group session. As patients processed their anxiety about discharge and described healthy supports patient stated she feels much better. She stated that she understand why her daughters don't like when she is in the hospital. She stated she is getting through this to get ready to get discharge. Patients identified at least one self-care tool they were willing to use after discharge.   Therapeutic Modalities Cognitive Behavioral Therapy Motivational Interviewing   Zerick Prevette  CUEBAS-COLON, LCSW 08/01/2017 12:58 PM

## 2017-08-01 NOTE — Progress Notes (Signed)
[facility] MD Progress Note  08/01/2017 4:36 PM Denise Brandt  MRN:  [id number] Subjective: Follow-up consult for 36 year old woman with a history of PTSD and substance abuse.  Patient says her mood is still feeling anxious and nervous and she is having trouble sleeping through the night.  She denies having any suicidal thoughts however and denies any psychosis.  Patient says that she had ask her primary doctor yesterday about getting some tests regarding sexually transmitted diseases.  The patient's complaints about this seem a little bit vague but she is quite anxious about it.  Tolerating medicine well otherwise.  Patient gets out in the group and interacts with other people appropriately Principal Problem: Severe recurrent major depressive disorder with psychotic features [facility]) Diagnosis:   Patient Active Problem List   Diagnosis Date Noted  . Severe recurrent major depressive disorder with psychotic features ([place]) [F33.3] 07/26/2017  . Alcohol use disorder, moderate, dependence ([place]) [F10.20] 07/26/2017  . Suicidal ideation [R45.851] 07/26/2017  . Tobacco use disorder [F17.200] 07/26/2017  . Cocaine use disorder, moderate, dependence ([place]) [F14.20] 07/26/2017  . PTSD (post-traumatic stress disorder) [F43.10] 07/24/2017   Total Time spent with patient: 30 minutes  Past Psychiatric History: Patient has a history of PTSD and some substance abuse.  Past Medical History:  Past Medical History:  Diagnosis Date  . Depression   . Ovarian cyst     Past Surgical History:  Procedure Laterality Date  . CESAREAN SECTION     Family History: History reviewed. No pertinent family history. Family Psychiatric  History: Negative Social History:  Social History   Substance and Sexual Activity  Alcohol Use Yes  . Alcohol/week: 2.4 oz  . Types: 4 Cans of beer per week   Comment: daily     Social History   Substance and Sexual Activity  Drug Use No    Social History   Socioeconomic  History  . Marital status: Single    Spouse name: None  . Number of children: None  . Years of education: None  . Highest education level: None  Social Needs  . Financial resource strain: None  . Food insecurity - worry: None  . Food insecurity - inability: None  . Transportation needs - medical: None  . Transportation needs - non-medical: None  Occupational History  . None  Tobacco Use  . Smoking status: Current Every Day Smoker    Types: Cigars  . Smokeless tobacco: Never Used  Substance and Sexual Activity  . Alcohol use: Yes    Alcohol/week: 2.4 oz    Types: 4 Cans of beer per week    Comment: daily  . Drug use: No  . Sexual activity: None  Other Topics Concern  . None  Social History Narrative  . None   Additional Social History:                         Sleep: Fair  Appetite:  Fair  Current Medications: Current Facility-Administered Medications  Medication Dose Route Frequency Provider Last Rate Last Dose  . acetaminophen (TYLENOL) tablet 650 mg  650 mg Oral Q6H PRN Marylin Crosby, MD   650 mg at 07/29/17 2006  . alum & mag hydroxide-simeth (MAALOX/MYLANTA) 200-200-20 MG/5ML suspension 30 mL  30 mL Oral Q4H PRN McNew, Tyson Babinski, MD      . Derrill Memo ON 08/02/2017] citalopram (CELEXA) tablet 40 mg  40 mg Oral Daily Shacara Cozine, Madie Reno, MD      .  docusate sodium (COLACE) capsule 100 mg  100 mg Oral BID Lenward Chancellor, MD   100 mg at 08/01/17 1605  . hydrOXYzine (ATARAX/VISTARIL) tablet 50 mg  50 mg Oral Q4H PRN Marylin Crosby, MD   50 mg at 08/01/17 1403  . magnesium hydroxide (MILK OF MAGNESIA) suspension 30 mL  30 mL Oral Daily PRN Marylin Crosby, MD   30 mL at 07/29/17 0840  . nicotine (NICODERM CQ - dosed in mg/24 hours) patch 21 mg  21 mg Transdermal Daily Pucilowska, Jolanta B, MD   21 mg at 08/01/17 0820  . prazosin (MINIPRESS) capsule 2 mg  2 mg Oral QHS Lenward Chancellor, MD   2 mg at 07/31/17 2143  . risperiDONE (RISPERDAL) tablet 2 mg  2 mg Oral BID  Pucilowska, Jolanta B, MD   2 mg at 08/01/17 1605  . senna (SENOKOT) tablet 8.6 mg  1 tablet Oral Once PRN McNew, Tyson Babinski, MD      . traZODone (DESYREL) tablet 200 mg  200 mg Oral QHS Macrae Wiegman, Madie Reno, MD        Lab Results:  Results for orders placed or performed during the hospital encounter of 07/24/17 (from the past 48 hour(s))  Pregnancy, urine     Status: None   Collection Time: 07/30/17  5:46 PM  Result Value Ref Range   Preg Test, Ur NEGATIVE NEGATIVE    Blood Alcohol level:  Lab Results  Component Value Date   ETH 167 (H) 60/45/4098    Metabolic Disorder Labs: Lab Results  Component Value Date   HGBA1C 5.3 07/25/2017   MPG 105.41 07/25/2017   No results found for: PROLACTIN Lab Results  Component Value Date   CHOL 139 07/25/2017   TRIG 110 07/25/2017   HDL 52 07/25/2017   CHOLHDL 2.7 07/25/2017   VLDL 22 07/25/2017   LDLCALC 65 07/25/2017    Physical Findings: AIMS: Facial and Oral Movements Muscles of Facial Expression: None, normal Lips and Perioral Area: None, normal Jaw: None, normal Tongue: None, normal,Extremity Movements Upper (arms, wrists, hands, fingers): None, normal Lower (legs, knees, ankles, toes): None, normal, Trunk Movements Neck, shoulders, hips: None, normal, Overall Severity Severity of abnormal movements (highest score from questions above): None, normal Incapacitation due to abnormal movements: None, normal Patient's awareness of abnormal movements (rate only patient's report): No Awareness, Dental Status Current problems with teeth and/or dentures?: No Does patient usually wear dentures?: No  CIWA:    COWS:     Musculoskeletal: Strength & Muscle Tone: within normal limits Gait & Station: normal Patient leans: N/A  Psychiatric Specialty Exam: Physical Exam  Nursing note and vitals reviewed. Constitutional: She appears well-developed and well-nourished.  HENT:  Head: Normocephalic and atraumatic.  Eyes: Conjunctivae are  normal. Pupils are equal, round, and reactive to light.  Neck: Normal range of motion.  Cardiovascular: Regular rhythm and normal heart sounds.  Respiratory: Effort normal. No respiratory distress.  GI: Soft.  Musculoskeletal: Normal range of motion.  Neurological: She is alert.  Skin: Skin is warm and dry.  Psychiatric: Her speech is normal and behavior is normal. Judgment and thought content normal. Her mood appears anxious. Cognition and memory are normal.    Review of Systems  Constitutional: Negative.   HENT: Negative.   Eyes: Negative.   Respiratory: Negative.   Cardiovascular: Negative.   Gastrointestinal: Negative.   Musculoskeletal: Negative.   Skin: Negative.   Neurological: Negative.   Psychiatric/Behavioral: Positive for depression. Negative for hallucinations,  memory loss, substance abuse and suicidal ideas. The patient is nervous/anxious. The patient does not have insomnia.     Blood pressure 107/63, pulse 73, temperature 98.7 F (37.1 C), temperature source Oral, resp. rate 16, height 5\' 4"  (1.626 m), weight 73.9 kg (163 lb), SpO2 99 %, unknown if currently breastfeeding.Body mass index is 27.98 kg/m.  General Appearance: Casual  Eye Contact:  Fair  Speech:  Clear and Coherent  Volume:  Normal  Mood:  Dysphoric  Affect:  Constricted  Thought Process:  Coherent  Orientation:  Full (Time, Place, and Person)  Thought Content:  Logical  Suicidal Thoughts:  No  Homicidal Thoughts:  No  Memory:  Immediate;   Good Recent;   Fair Remote;   Fair  Judgement:  Fair  Insight:  Fair  Psychomotor Activity:  Decreased  Concentration:  Concentration: Fair  Recall:  [employer] of Knowledge:  Fair  Language:  Fair  Akathisia:  No  Handed:  Right  AIMS (if indicated):     Assets:  Desire for Improvement Physical Health Resilience Social Support  ADL's:  Intact  Cognition:  WNL  Sleep:  Number of Hours: 5.75     Treatment Plan Summary: Medication management and  Plan Increased citalopram to 40 mg a day for her complaints of depression and anxiety.  Patient is really concerned about possible sexually transmitted diseases.  I told her I will see if 1 of the hospitalist can check up on her and see if they feel that anything needs to be done.  Otherwise encouraged her to continue with her interactions on the unit.  No other change to treatment for now.  Alethia Berthold, MD 08/01/2017, 4:36 PM

## 2017-08-01 NOTE — Plan of Care (Signed)
Patient is alert and oriented x 4. Complained of being anxious this morning, prn medication administered. Up ad lib with steady gait, interacts well with peers. Compliant with medications and meals. Denies SI/HI. Milieu remains safe with q 15 minute safety checks. Will continue to monitor and inform MD of any acute changes in condition.

## 2017-08-01 NOTE — Progress Notes (Signed)
Patient is alert and oriented x 4. Complained of being anxious this morning, prn medication administered. Up ad lib with steady gait, interacts well with peers. Compliant with medications and meals. Denies SI/HI. Milieu remains safe with q 15 minute safety checks. Will continue to monitor and inform MD of any acute changes in condition.

## 2017-08-02 MED ORDER — SENNOSIDES-DOCUSATE SODIUM 8.6-50 MG PO TABS
2.0000 | ORAL_TABLET | Freq: Once | ORAL | Status: DC
  Filled 2017-08-02: qty 2

## 2017-08-02 MED ORDER — DOCUSATE SODIUM 100 MG PO CAPS
100.0000 mg | ORAL_CAPSULE | Freq: Two times a day (BID) | ORAL | Status: DC
  Administered 2017-08-02 – 2017-08-03 (×3): 100 mg via ORAL

## 2017-08-02 NOTE — Progress Notes (Signed)
Pt spent most of the evening in the dayroom interacting with peers and staff. She was cooperative with treatment , he appears to be med seeking although asking about various medications, Probation officer educated patient on medication regime. She still reports anxiety and depression given PRN on shift. She appears to be in bed resting quietly at this time.

## 2017-08-02 NOTE — Plan of Care (Signed)
Patient denies SI/HI/AVH at this time and verbalizes understanding of prescribed therapeutic regimen without any further questions or concerns. Patient has been attending the unit groups/activities and interacting well with the other members on the unit. Patient has been able to remain free from injury on the unit thus far. Patient has demonstrated that she is able to function at an adequate level. Patient states that she didn't sleep well last night because didn't fall asleep until 1 a.m and I was back up at  5 a.m.  Patient is still working on her list of coping skills.

## 2017-08-02 NOTE — [facility] Group Notes (Signed)

## 2017-08-02 NOTE — [facility] Group Notes (Signed)
[place] Group Notes:  (Nursing/MHT/Case Management/Adjunct)  Date:  08/02/2017  Time:  12:12 AM  Type of Therapy:  Group Therapy  Participation Level:  Active  Participation Quality:  Appropriate  Affect:  Appropriate  Cognitive:  Appropriate  Insight:  Good  Engagement in Group:  Engaged  Modes of Intervention:  Support  Summary of Progress/Problems:  Denise Brandt 08/02/2017, 12:12 AM

## 2017-08-02 NOTE — Progress Notes (Signed)
[facility] MD Progress Note  08/02/2017 6:31 PM Denise Brandt  MRN:  [id number] Subjective: Patient with depression reports that she is feeling much better.  Denies hallucinations.  Denies paranoia.  Physically feeling better.  She does complain of constipation. Principal Problem: Severe recurrent major depressive disorder with psychotic features ([place]) Diagnosis:   Patient Active Problem List   Diagnosis Date Noted  . Severe recurrent major depressive disorder with psychotic features ([place]) [F33.3] 07/26/2017  . Alcohol use disorder, moderate, dependence ([place]) [F10.20] 07/26/2017  . Suicidal ideation [R45.851] 07/26/2017  . Tobacco use disorder [F17.200] 07/26/2017  . Cocaine use disorder, moderate, dependence ([place]) [F14.20] 07/26/2017  . PTSD (post-traumatic stress disorder) [F43.10] 07/24/2017   Total Time spent with patient: 30 minutes  Past Psychiatric History: Past history of depression and substance abuse  Past Medical History:  Past Medical History:  Diagnosis Date  . Depression   . Ovarian cyst     Past Surgical History:  Procedure Laterality Date  . CESAREAN SECTION     Family History: History reviewed. No pertinent family history. Family Psychiatric  History: None Social History:  Social History   Substance and Sexual Activity  Alcohol Use Yes  . Alcohol/week: 2.4 oz  . Types: 4 Cans of beer per week   Comment: daily     Social History   Substance and Sexual Activity  Drug Use No    Social History   Socioeconomic History  . Marital status: Single    Spouse name: None  . Number of children: None  . Years of education: None  . Highest education level: None  Social Needs  . Financial resource strain: None  . Food insecurity - worry: None  . Food insecurity - inability: None  . Transportation needs - medical: None  . Transportation needs - non-medical: None  Occupational History  . None  Tobacco Use  . Smoking status: Current Every Day Smoker   Types: Cigars  . Smokeless tobacco: Never Used  Substance and Sexual Activity  . Alcohol use: Yes    Alcohol/week: 2.4 oz    Types: 4 Cans of beer per week    Comment: daily  . Drug use: No  . Sexual activity: None  Other Topics Concern  . None  Social History Narrative  . None   Additional Social History:                         Sleep: Fair  Appetite:  Fair  Current Medications: Current Facility-Administered Medications  Medication Dose Route Frequency Provider Last Rate Last Dose  . acetaminophen (TYLENOL) tablet 650 mg  650 mg Oral Q6H PRN Marylin Crosby, MD   650 mg at 07/29/17 2006  . alum & mag hydroxide-simeth (MAALOX/MYLANTA) 200-200-20 MG/5ML suspension 30 mL  30 mL Oral Q4H PRN McNew, Tyson Babinski, MD      . citalopram (CELEXA) tablet 40 mg  40 mg Oral Daily Carlon Davidson, Madie Reno, MD   40 mg at 08/02/17 0829  . docusate sodium (COLACE) capsule 100 mg  100 mg Oral BID Lenward Chancellor, MD   100 mg at 08/02/17 1714  . hydrOXYzine (ATARAX/VISTARIL) tablet 50 mg  50 mg Oral Q4H PRN Marylin Crosby, MD   50 mg at 08/02/17 1715  . magnesium hydroxide (MILK OF MAGNESIA) suspension 30 mL  30 mL Oral Daily PRN Marylin Crosby, MD   30 mL at 08/01/17 2150  . nicotine (NICODERM CQ - dosed  in mg/24 hours) patch 21 mg  21 mg Transdermal Daily Pucilowska, Jolanta B, MD   21 mg at 08/02/17 0830  . prazosin (MINIPRESS) capsule 2 mg  2 mg Oral QHS Lenward Chancellor, MD   2 mg at 08/01/17 2144  . risperiDONE (RISPERDAL) tablet 2 mg  2 mg Oral BID Pucilowska, Jolanta B, MD   2 mg at 08/02/17 1714  . senna (SENOKOT) tablet 8.6 mg  1 tablet Oral Once PRN McNew, Tyson Babinski, MD      . traZODone (DESYREL) tablet 200 mg  200 mg Oral QHS Chanda Laperle T, MD   200 mg at 08/01/17 2144    Lab Results: No results found for this or any previous visit (from the past 48 hour(s)).  Blood Alcohol level:  Lab Results  Component Value Date   ETH 167 (H) 46/65/9935    Metabolic Disorder Labs: Lab  Results  Component Value Date   HGBA1C 5.3 07/25/2017   MPG 105.41 07/25/2017   No results found for: PROLACTIN Lab Results  Component Value Date   CHOL 139 07/25/2017   TRIG 110 07/25/2017   HDL 52 07/25/2017   CHOLHDL 2.7 07/25/2017   VLDL 22 07/25/2017   LDLCALC 65 07/25/2017    Physical Findings: AIMS: Facial and Oral Movements Muscles of Facial Expression: None, normal Lips and Perioral Area: None, normal Jaw: None, normal Tongue: None, normal,Extremity Movements Upper (arms, wrists, hands, fingers): None, normal Lower (legs, knees, ankles, toes): None, normal, Trunk Movements Neck, shoulders, hips: None, normal, Overall Severity Severity of abnormal movements (highest score from questions above): None, normal Incapacitation due to abnormal movements: None, normal Patient's awareness of abnormal movements (rate only patient's report): No Awareness, Dental Status Current problems with teeth and/or dentures?: No Does patient usually wear dentures?: No  CIWA:    COWS:     Musculoskeletal: Strength & Muscle Tone: within normal limits Gait & Station: normal Patient leans: N/A  Psychiatric Specialty Exam: Physical Exam  Nursing note and vitals reviewed. Constitutional: She appears well-developed and well-nourished.  HENT:  Head: Normocephalic and atraumatic.  Eyes: Conjunctivae are normal. Pupils are equal, round, and reactive to light.  Neck: Normal range of motion.  Cardiovascular: Regular rhythm and normal heart sounds.  Respiratory: Effort normal. No respiratory distress.  GI: Soft.  Musculoskeletal: Normal range of motion.  Neurological: She is alert.  Skin: Skin is warm and dry.  Psychiatric: She has a normal mood and affect. Her behavior is normal. Judgment and thought content normal.    Review of Systems  Constitutional: Negative.   HENT: Negative.   Eyes: Negative.   Respiratory: Negative.   Cardiovascular: Negative.   Gastrointestinal: Positive for  constipation.  Musculoskeletal: Negative.   Skin: Negative.   Neurological: Negative.   Psychiatric/Behavioral: Negative.  Negative for depression.    Blood pressure 99/62, pulse 77, temperature 98.6 F (37 C), temperature source Oral, resp. rate 18, height 5\' 4"  (1.626 m), weight 73.9 kg (163 lb), SpO2 99 %, unknown if currently breastfeeding.Body mass index is 27.98 kg/m.  General Appearance: Casual  Eye Contact:  Good  Speech:  Clear and Coherent  Volume:  Decreased  Mood:  Euthymic  Affect:  Constricted  Thought Process:  Goal Directed  Orientation:  Full (Time, Place, and Person)  Thought Content:  Logical  Suicidal Thoughts:  No  Homicidal Thoughts:  No  Memory:  Immediate;   Good Recent;   Fair Remote;   Fair  Judgement:  Fair  Insight:  Fair  Psychomotor Activity:  Decreased  Concentration:  Concentration: Fair  Recall:  [employer] of Knowledge:  Fair  Language:  Fair  Akathisia:  No  Handed:  Right  AIMS (if indicated):     Assets:  Desire for Improvement Housing Physical Health Resilience  ADL's:  Intact  Cognition:  WNL  Sleep:  Number of Hours: 5.75     Treatment Plan Summary: Daily contact with patient to assess and evaluate symptoms and progress in treatment, Medication management and Plan Adding Senokot and Colace for constipation otherwise no new change to medical problems.  Patient is no longer complaining of worrying about STDs.  Encouraged her continued medication compliance and group attendance.  Alethia Berthold, MD 08/02/2017, 6:31 PM

## 2017-08-02 NOTE — Progress Notes (Signed)
D- Patient alert and oriented. Patient presents in an irritable and anxious mood on assessment. Patient states that she is upset with the nurses from last night's shift because "they were difficult and rude". Patient endorses an "8/10" anxiety level because "not knowing my next move and I can't get my clothes".  Patient denies SI, HI, AVH, at this time. Patient states that her pain level is a "7/10" because of a headache, however, the patient did not request any pain medication from this writer at this time.   A- Scheduled medications administered to patient, per MD orders. Support and encouragement provided.  Routine safety checks conducted every 15 minutes.  Patient informed to notify staff with problems or concerns.  R- No adverse drug reactions noted. Patient contracts for safety at this time. Patient compliant with medications and treatment plan. Patient receptive, calm, and cooperative. Patient interacts well with others on the unit.  Patient remains safe at this time.

## 2017-08-03 MED ORDER — RISPERIDONE 2 MG PO TABS: 2.0000 mg | ORAL_TABLET | Freq: Two times a day (BID) | ORAL | 0 refills | Status: DC

## 2017-08-03 MED ORDER — PRAZOSIN HCL 2 MG PO CAPS: 2.0000 mg | ORAL_CAPSULE | Freq: Every day | ORAL | 0 refills | Status: DC

## 2017-08-03 MED ORDER — TRAZODONE HCL 100 MG PO TABS: 200.0000 mg | ORAL_TABLET | Freq: Every day | ORAL | 0 refills | Status: DC

## 2017-08-03 MED ORDER — CITALOPRAM HYDROBROMIDE 40 MG PO TABS: 40.0000 mg | ORAL_TABLET | Freq: Every day | ORAL | 0 refills | Status: DC

## 2017-08-03 MED ORDER — HYDROXYZINE HCL 50 MG PO TABS: 50.0000 mg | ORAL_TABLET | ORAL | 0 refills | Status: DC | PRN

## 2017-08-03 NOTE — Progress Notes (Signed)
Recreation Therapy Notes  INPATIENT RECREATION TR PLAN  Patient Details Name: Denise Brandt MRN: [id number] DOB: 11/06/1980 Today's Date: 08/03/2017  Rec Therapy Plan Is patient appropriate for Therapeutic Recreation?: Yes Treatment times per week: At least 3 Estimated Length of Stay: 5-7 days TR Treatment/Interventions: Group participation (Comment)  Discharge Criteria Pt will be discharged from therapy if:: Discharged Treatment plan/goals/alternatives discussed and agreed upon by:: Patient/family  Discharge Summary Short term goals set: Patient will be able to identify at least 5 coping skills by conclusion of recreation therapy tx.  Short term goals met: Complete Progress toward goals comments: Groups attended Which groups?: Self-esteem, Anger management, Coping skills(Creative expressions) Reason goals not met: N/A Therapeutic equipment acquired: N/A Reason patient discharged from therapy: Discharge from hospital Pt/family agrees with progress & goals achieved: Yes Date patient discharged from therapy: 08/03/17      08/03/2017, 4:45 PM  

## 2017-08-03 NOTE — Plan of Care (Signed)
Pt reports not sleeping well. Pt able to verbalize understanding of education provided by this Probation officer. Pt able to remain safe while on the unit and verbally contract for safety. Pt reports hallucinations this evening that persist.

## 2017-08-03 NOTE — Progress Notes (Signed)
Recreation Therapy Notes  Date: 12.17 .2018  Time: 3:00pm  Location: Outside  Behavioral response: Appropriate  Group Type: Leisure  Participation level: Active  Communication: Patient was social with peers and staff.  Comments: N/A  Canna Nickelson LRT/CTRS        Denise Brandt 08/03/2017 4:52 PM

## 2017-08-03 NOTE — [facility] Group Notes (Signed)
[facility] Group Notes:  (Nursing/MHT/Case Management/Adjunct)  Date:  08/03/2017  Time:  5:53 PM  Type of Therapy:  Psychoeducational Skills  Participation Level:  Active  Participation Quality:  Sharing and Supportive  Affect:  Appropriate  Cognitive:  Oriented  Insight:  Appropriate  Engagement in Group:  Engaged  Modes of Intervention:  Education  Summary of Progress/Problems:  Cleotis Lema 08/03/2017, 5:53 PM

## 2017-08-03 NOTE — Progress Notes (Signed)
D:Pt denies SI/HI, but reports auditory hallucinations stating that, "they tell me to leave here". Pt verbally contracts for safety and states she can remain safe while on the unit. Pt reports feeling, "better" today then she did yesterday. Pt reports problems sleeping, but reports eating good.   A: Q x 15 minute observation checks were completed for safety. Patient was provided with education. Patient was given scheduled/PRN medications. Patient  was encourage to attend groups, participate in unit activities and continue with plan of care.   R:Patient is complaint with medication and unit procedures. Pt frequently seen in the milieu participating and socializing with peers.             Patient slept for Estimated Hours of 7; Precautionary checks every 15 minutes for safety maintained, room free of safety hazards, patient sustains no injury or falls during this shift.

## 2017-08-03 NOTE — Plan of Care (Signed)
Patient is up ad lib with steady gait, interacts well with peers. Continues to endorse auditory hallucinations telling her that she needs to "leave this place and go do something ." Patient very vague about what the "something" is. Attends groups and actively participates. Compliant with medication and meals. Milieu remains safe with q 15 minute safety checks.

## 2017-08-03 NOTE — Progress Notes (Signed)
Recreation Therapy Notes   Date: 12.17.2018  Time: 1:00pm   Location: Craft Room  Behavioral response: Appropriate  Intervention Topic: Creative Expressions  Discussion/Intervention: Group content on today was focused on creative expressions. The group defined creative expressions and ways they use creative expressions. Individual identified other positive ways creative expressions can be used and why it is important to express yourself. Patients participated in the intervention "expressive painting", where they had a chance to creatively express themselves. Clinical Observations/Feedback:  Patient came to group and defined creative expressions as something you do to let out feelings and emotions. She described coloring, witting and poetry as things she uses as creative expressions. Individual stated it is important to use creative expression to let things out. Individual participated in the intervention and was social with staff.  Macari Zalesky LRT/CTRS         Charnese Federici 08/03/2017 2:34 PM

## 2017-08-03 NOTE — [facility] Suicide Risk Assessment (Signed)
[facility] Discharge Suicide Risk Assessment   Principal Problem: Severe recurrent major depressive disorder with psychotic features [facility]) Discharge Diagnoses:  Patient Active Problem List   Diagnosis Date Noted  . Severe recurrent major depressive disorder with psychotic features ([place]) [F33.3] 07/26/2017  . Alcohol use disorder, moderate, dependence ([place]) [F10.20] 07/26/2017  . Suicidal ideation [R45.851] 07/26/2017  . Tobacco use disorder [F17.200] 07/26/2017  . Cocaine use disorder, moderate, dependence ([place]) [F14.20] 07/26/2017  . PTSD (post-traumatic stress disorder) [F43.10] 07/24/2017    Total Time spent with patient: 20 minutes  Mental Status Per Nursing Assessment::   On Admission:  NA  Demographic Factors:  NA  Loss Factors: Financial problems/change in socioeconomic status  Historical Factors: Impulsivity  Risk Reduction Factors:   Responsible for children under 74 years of age, Sense of responsibility to family, Positive social support, Positive therapeutic relationship and Positive coping skills or problem solving skills  Continued Clinical Symptoms:  None Cognitive Features That Contribute To Risk:  None    Suicide Risk:  Minimal: No identifiable suicidal ideation.  Patients presenting with no risk factors but with morbid ruminations; may be classified as minimal risk based on the severity of the depressive symptoms  Follow-up Information    [facility]. Go on 08/05/2017.   Specialty:  Behavioral Health Why:  Walk in between 8am-3:30am.   Contact information: [address] [place] [address] [phone]        Services, [facility]. Schedule an appointment as soon as possible for a visit.   Why:  Should you decide to persue residential treatment services, you can contact them to be assessed for admission from home. Contact information: [address] [place] [address] [phone]           Plan Of Care/Follow-up recommendations:   See above, Call [facility] to inquire about residential treatment  Marylin Crosby, MD 08/03/2017, 1:46 PM

## 2017-08-03 NOTE — Discharge Summary (Signed)
Physician Discharge Summary Note  Patient:  Denise Brandt is an 36 y.o., female MRN:  [id number] DOB:  [birth year] Patient phone:  [phone] (home)  Patient address:   [place] [address],  Total Time spent with patient: 20 minutes  Plus 20 minutes of medication reconciliation, discharge planning, and discharge documentation   Date of Admission:  07/24/2017 Date of Discharge: 08/03/17  Reason for Admission:  Suicidal thoughts, AH  Principal Problem: Severe recurrent major depressive disorder with psychotic features [facility]) Discharge Diagnoses: Patient Active Problem List   Diagnosis Date Noted  . Severe recurrent major depressive disorder with psychotic features ([place]) [F33.3] 07/26/2017  . Alcohol use disorder, moderate, dependence ([place]) [F10.20] 07/26/2017  . Suicidal ideation [R45.851] 07/26/2017  . Tobacco use disorder [F17.200] 07/26/2017  . Cocaine use disorder, moderate, dependence ([place]) [F14.20] 07/26/2017  . PTSD (post-traumatic stress disorder) [F43.10] 07/24/2017    Past Psychiatric History: See H&P  Past Medical History:  Past Medical History:  Diagnosis Date  . Depression   . Ovarian cyst     Past Surgical History:  Procedure Laterality Date  . CESAREAN SECTION     Family History: History reviewed. No pertinent family history. Family Psychiatric  History: See H&P Social History:  Social History   Substance and Sexual Activity  Alcohol Use Yes  . Alcohol/week: 2.4 oz  . Types: 4 Cans of beer per week   Comment: daily     Social History   Substance and Sexual Activity  Drug Use No    Social History   Socioeconomic History  . Marital status: Single    Spouse name: None  . Number of children: None  . Years of education: None  . Highest education level: None  Social Needs  . Financial resource strain: None  . Food insecurity - worry: None  . Food insecurity - inability: None  . Transportation needs - medical: None  .  Transportation needs - non-medical: None  Occupational History  . None  Tobacco Use  . Smoking status: Current Every Day Smoker    Types: Cigars  . Smokeless tobacco: Never Used  Substance and Sexual Activity  . Alcohol use: Yes    Alcohol/week: 2.4 oz    Types: 4 Cans of beer per week    Comment: daily  . Drug use: No  . Sexual activity: None  Other Topics Concern  . None  Social History Narrative  . None    Hospital Course:  Pt was started on Celexa for anxiety and depression. She was started on Prazosin and titrated to 2 mg for nightmares. This could not be increased further due to lower BP. She was also started on Risperdal and titrated up to 2 mg BID for AH. Pt attended and participated in groups and got a lot of skills out of them. On day of discharge, pt had brighter affect and was very future oriented. 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 She did not want residential treatment directly from the hospital but she stated that she wanted to pursue it on her own after she took care of a few personal things. She declined to have [place] make referrals.  When asked, she denied SI or any thoughts of self harm. Denied HI or AH. She was organized and goal directed in t thoughts. She did not appear manic or psychotic. She displayed good insight into her self  and things she needed to do. She requested discharge because she had an uncle that would allow her to stay with him but he is heading out of town tomorrow so had to be today. She stated otherwise she would not have anywhere to go on discharge. She appeared bright and able to smile and laugh. She felt medications were very helpful for her and plans to continue taking them. She was given 7 day supply of medications from our pharmacy.  The patient is at low risk of imminent suicide. Patient denied thoughts, intent, or plan for harm to self or others, expressed  significant future orientation, and expressed an ability to mobilize assistance for his/her needs. She is presently void of any contributing psychiatric symptoms, cognitive difficulties, or substance use which would elevate her risk for lethality. Chronic risk for lethality is elevated in light of substance abuse, homelessness The chronic risk is presently mitigated by her ongoing desire and engagement in [facility] treatment and mobilization of support from family and friends. Chronic risk may elevate if she experiences any significant loss or worsening of symptoms, which can be managed and monitored through outpatient providers. At this time,a cute risk for lethality is low and she is stable for ongoing outpatient management.   Modifiable risk factors were addressed during this hospitalization through appropriate pharmacotherapy and establishment of outpatient follow-up treatment. Some risk factors for suicide are situational (i.e. Unstable housing) or related personality pathology (i.e. Poor coping mechanisms) and thus cannot be further mitigated by continued hospitalization in this setting.    Physical Findings: AIMS: Facial and Oral Movements Muscles of Facial Expression: None, normal Lips and Perioral Area: None, normal Jaw: None, normal Tongue: None, normal,Extremity Movements Upper (arms, wrists, hands, fingers): None, normal Lower (legs, knees, ankles, toes): None, normal, Trunk Movements Neck, shoulders, hips: None, normal, Overall Severity Severity of abnormal movements (highest score from questions above): None, normal Incapacitation due to abnormal movements: None, normal Patient's awareness of abnormal movements (rate only patient's report): No Awareness, Dental Status Current problems with teeth and/or dentures?: No Does patient usually wear dentures?: No  CIWA:    COWS:     Musculoskeletal: Strength & Muscle Tone: within normal limits Gait & Station: normal Patient leans:  N/A  Psychiatric Specialty Exam: Physical Exam  ROS  Blood pressure (!) 92/46, pulse 75, temperature 98.7 F (37.1 C), temperature source Oral, resp. rate 18, height 5\' 4"  (1.626 m), weight 73.9 kg (163 lb), SpO2 99 %, unknown if currently breastfeeding.Body mass index is 27.98 kg/m.  General Appearance: Casual  Eye Contact:  Good  Speech:  Clear and Coherent  Volume:  Normal  Mood:  Euthymic  Affect:  Congruent  Thought Process:  Goal Directed  Orientation:  Full (Time, Place, and Person)  Thought Content:  Negative  Suicidal Thoughts:  No  Homicidal Thoughts:  No  Memory:  Immediate;   Fair  Judgement:  Fair  Insight:  Fair  Psychomotor Activity:  Normal  Concentration:  Concentration: Fair  Recall:  [place] of Knowledge:  Poor  Language:  Fair  Akathisia:  No      Assets:  Communication Skills Desire for Improvement Resilience  ADL's:  Intact  Cognition:  WNL  Sleep:  Number of Hours: 7        Has this patient used any form of tobacco in the last 30 days? (Cigarettes, Smokeless Tobacco, Cigars, and/or Pipes) Yes, Yes, A prescription for an FDA-approved tobacco cessation medication was offered at discharge  and the patient refused  Blood Alcohol level:  Lab Results  Component Value Date   ETH 167 (H) 31/51/7616    Metabolic Disorder Labs:  Lab Results  Component Value Date   HGBA1C 5.3 07/25/2017   MPG 105.41 07/25/2017   No results found for: PROLACTIN Lab Results  Component Value Date   CHOL 139 07/25/2017   TRIG 110 07/25/2017   HDL 52 07/25/2017   CHOLHDL 2.7 07/25/2017   VLDL 22 07/25/2017   LDLCALC 65 07/25/2017    See Psychiatric Specialty Exam and Suicide Risk Assessment completed by Attending Physician prior to discharge.  Discharge destination:  Home  Is patient on multiple antipsychotic therapies at discharge:  No   Has Patient had three or more failed trials of antipsychotic monotherapy by history:  No  Recommended Plan for  Multiple Antipsychotic Therapies: NA  Discharge Instructions    Increase activity slowly   Complete by:  As directed      Allergies as of 08/03/2017   No Known Allergies     Medication List    STOP taking these medications   metroNIDAZOLE 500 MG tablet Commonly known as:  FLAGYL     TAKE these medications     Indication  citalopram 40 MG tablet Commonly known as:  CELEXA Take 1 tablet (40 mg total) by mouth daily. Start taking on:  08/04/2017  Indication:  Posttraumatic Stress Disorder   hydrOXYzine 50 MG tablet Commonly known as:  ATARAX/VISTARIL Take 1 tablet (50 mg total) by mouth every 4 (four) hours as needed for anxiety.  Indication:  Feeling Anxious   prazosin 2 MG capsule Commonly known as:  MINIPRESS Take 1 capsule (2 mg total) by mouth at bedtime.  Indication:  Nightmares   risperiDONE 2 MG tablet Commonly known as:  RISPERDAL Take 1 tablet (2 mg total) by mouth 2 (two) times daily.  Indication:  Major Depressive Disorder   traZODone 100 MG tablet Commonly known as:  DESYREL Take 2 tablets (200 mg total) by mouth at bedtime.  Indication:  Trouble Sleeping      Follow-up [facility]. Go on 08/05/2017.   Specialty:  Behavioral Health Why:  Walk in between 8am-3:30am.   Contact information: [address] [place] [address] [phone]        Services, [facility]. Schedule an appointment as soon as possible for a visit.   Why:  Should you decide to persue residential treatment services, you can contact them to be assessed for admission from home. Contact information: [place] [address] [phone]           Follow-up recommendations: See above. Contact [facility] to inquire about residential treatment when you are ready  Signed: Marylin Crosby, MD 08/03/2017, 1:47 PM

## 2017-08-03 NOTE — Progress Notes (Signed)
Patient discharged on above date and time. Affect pleasant and cooperative. Denies pain, SI/HI/AVH. States, "No I'm fine now that I am leaving, I am not hearing any voices. " Patient  Verbalized understanding discharge information provided to her. Upon departure, patient had discharge paperwork, personal items, prescriptions and a 7 day supply of medications in hand. Patient was picked up by her uncle and plans to follow up with [facility] and [facility].

## 2017-08-03 NOTE — Progress Notes (Signed)
Patient is up ad lib with steady gait, interacts well with peers. Continues to endorse auditory hallucinations telling her that she needs to "leave this place and go do something ." Patient very vague about what the "something" is. Attends groups and actively participates. Compliant with medication and meals. Milieu remains safe with q 15 minute safety checks.

## 2017-08-03 NOTE — [facility] Group Notes (Signed)
08/03/2017 0930   Type of Therapy and Topic:  Group Therapy:  Overcoming Obstacles   Participation Level:  Did Not Attend   Description of Group:   In this group patients will be encouraged to explore what they see as obstacles to their own wellness and recovery. They will be guided to discuss their thoughts, feelings, and behaviors related to these obstacles. The group will process together ways to cope with barriers, with attention given to specific choices patients can make. Each patient will be challenged to identify changes they are motivated to make in order to overcome their obstacles. This group will be process-oriented, with patients participating in exploration of their own experiences, giving and receiving support, and processing challenge from other group members.   Therapeutic Goals: 1. Patient will identify personal and current obstacles as they relate to admission. 2. Patient will identify barriers that currently interfere with their wellness or overcoming obstacles.  3. Patient will identify feelings, thought process and behaviors related to these barriers. 4. Patient will identify two changes they are willing to make to overcome these obstacles:      Summary of Patient Progress  Pt was invited to attend group but chose not to attend. CSW will continue to encourage pt to attend group throughout their admission.     Therapeutic Modalities:   Cognitive Behavioral Therapy Solution Focused Therapy Motivational Interviewing Relapse Prevention Therapy  Alden Hipp, MSW, LCSW 08/03/2017 10:30 AM

## 2017-12-08 ENCOUNTER — Encounter (HOSPITAL_COMMUNITY): Payer: Self-pay | Admitting: Emergency Medicine

## 2017-12-08 ENCOUNTER — Emergency Department (HOSPITAL_COMMUNITY)
Admission: EM | Admit: 2017-12-08 | Discharge: 2017-12-09 | Disposition: A | Discharge status: Home / Self Care | Payer: Self-pay | Attending: Emergency Medicine | Admitting: Emergency Medicine

## 2017-12-08 DIAGNOSIS — F17290 Nicotine dependence, other tobacco product, uncomplicated: Secondary | ICD-10-CM | POA: Insufficient documentation

## 2017-12-08 DIAGNOSIS — N3090 Cystitis, unspecified without hematuria: Secondary | ICD-10-CM | POA: Insufficient documentation

## 2017-12-08 LAB — URINALYSIS, ROUTINE W REFLEX MICROSCOPIC
Bilirubin Urine: NEGATIVE
Glucose, UA: NEGATIVE mg/dL
KETONES UR: 20 mg/dL — AB
NITRITE: POSITIVE — AB
PH: 6 (ref 5.0–8.0)
Protein, ur: 100 mg/dL — AB
Specific Gravity, Urine: 1.02 (ref 1.005–1.030)
WBC, UA: >50 WBC/hpf — ABNORMAL HIGH (ref 0–5)

## 2017-12-08 LAB — I-STAT BETA HCG BLOOD, ED (MC, WL, AP ONLY): I-stat hCG, quantitative: <5 m[IU]/mL (ref <5)

## 2017-12-08 LAB — CBC
HEMATOCRIT: 40 % (ref 36.0–46.0)
Hemoglobin: 13.8 g/dL (ref 12.0–15.0)
MCH: 32.5 pg (ref 26.0–34.0)
MCHC: 34.5 g/dL (ref 30.0–36.0)
MCV: 94.1 fL (ref 78.0–100.0)
PLATELETS: 254 10*3/uL (ref 150–400)
RBC: 4.25 MIL/uL (ref 3.87–5.11)
RDW: 14.6 % (ref 11.5–15.5)
WBC: 11.1 10*3/uL — AB (ref 4.0–10.5)

## 2017-12-08 LAB — COMPREHENSIVE METABOLIC PANEL
ALT: 20 U/L (ref 14–54)
AST: 22 U/L (ref 15–41)
Albumin: 4.4 g/dL (ref 3.5–5.0)
Alkaline Phosphatase: 83 U/L (ref 38–126)
Anion gap: 12 (ref 5–15)
BILIRUBIN TOTAL: 0.9 mg/dL (ref 0.3–1.2)
BUN: 11 mg/dL (ref 6–20)
CHLORIDE: 102 mmol/L (ref 101–111)
CO2: 25 mmol/L (ref 22–32)
Calcium: 8.9 mg/dL (ref 8.9–10.3)
Creatinine, Ser: 0.72 mg/dL (ref 0.44–1.00)
Glucose, Bld: 102 mg/dL — ABNORMAL HIGH (ref 65–99)
POTASSIUM: 3.7 mmol/L (ref 3.5–5.1)
Sodium: 139 mmol/L (ref 135–145)
TOTAL PROTEIN: 8.3 g/dL — AB (ref 6.5–8.1)

## 2017-12-08 LAB — LIPASE, BLOOD: LIPASE: 21 U/L (ref 11–51)

## 2017-12-08 LAB — WET PREP, GENITAL
Sperm: NONE SEEN
TRICH WET PREP: NONE SEEN
Yeast Wet Prep HPF POC: NONE SEEN

## 2017-12-08 LAB — POC OCCULT BLOOD, ED: FECAL OCCULT BLD: NEGATIVE

## 2017-12-08 MED ORDER — IBUPROFEN 800 MG PO TABS
800.0000 mg | ORAL_TABLET | Freq: Once | ORAL | Status: AC
  Administered 2017-12-08: 800 mg via ORAL
  Filled 2017-12-08: qty 1

## 2017-12-08 MED ORDER — TRAMADOL HCL 50 MG PO TABS
50.0000 mg | ORAL_TABLET | Freq: Once | ORAL | Status: AC
  Administered 2017-12-09: 50 mg via ORAL
  Filled 2017-12-08: qty 1

## 2017-12-08 MED ORDER — AZITHROMYCIN 250 MG PO TABS
1000.0000 mg | ORAL_TABLET | Freq: Once | ORAL | Status: AC
  Administered 2017-12-09: 1000 mg via ORAL
  Filled 2017-12-08: qty 4

## 2017-12-08 MED ORDER — CEPHALEXIN 500 MG PO CAPS: 500.0000 mg | ORAL_CAPSULE | Freq: Two times a day (BID) | ORAL | 0 refills | Status: DC

## 2017-12-08 MED ORDER — CEFTRIAXONE SODIUM 250 MG IJ SOLR
250.0000 mg | Freq: Once | INTRAMUSCULAR | Status: AC
  Administered 2017-12-09: 250 mg via INTRAMUSCULAR
  Filled 2017-12-08: qty 250

## 2017-12-08 MED ORDER — PHENAZOPYRIDINE HCL 200 MG PO TABS
200.0000 mg | ORAL_TABLET | Freq: Once | ORAL | Status: AC
  Administered 2017-12-08: 200 mg via ORAL
  Filled 2017-12-08: qty 1

## 2017-12-08 MED ORDER — NAPROXEN 500 MG PO TABS: 500.0000 mg | ORAL_TABLET | Freq: Two times a day (BID) | ORAL | 0 refills | Status: DC

## 2017-12-08 NOTE — ED Triage Notes (Signed)
Pt c/o vaginal pain, vaginal bleeding and rectal bleeding for over year. Reports vaginal pain has gotten worse as to why she came in to be seen.

## 2017-12-08 NOTE — ED Provider Notes (Signed)
[place] DEPT Provider Note   CSN: [id number] Arrival date & time: 12/08/17  1626   History   Chief Complaint Chief Complaint  Patient presents with  . Vaginal Pain  . Vaginal Bleeding    HPI Denise Brandt is a 37 y.o. female with a hx of tobaco abuse, depression, and ovarian cyst who presents to the ED for suprapubic pain that started today. Patient states she gradually developed suprapubic discomfort this morning which has been progressively worsening. Patient describes the pain as a pressure, rates it a 10/10 in severity. It is worse prior to urination and somewhat relieved briefly after urination. She has not tried intervention at home. She is having some urinary urgency, reports she feels as though she has to be urinate continuously and has minimal output. Not necessarily experiencing dysuria. States that she is also having vaginal bleeding which is typical for her- LMP 2 weeks prior, she often has irregular/frequent menstrual cycles. She also has had some blood in her stool which she attributes to her hemorrhoids this is also not new. Denies fever, chills, nausea, vomiting, chest pain, or dyspnea. Patient states she is sexually active with multiple partners, intermittently using protection.   HPI  Past Medical History:  Diagnosis Date  . Depression   . Ovarian cyst     Patient Active Problem List   Diagnosis Date Noted  . Severe recurrent major depressive disorder with psychotic features ([place]) 07/26/2017  . Alcohol use disorder, moderate, dependence ([place]) 07/26/2017  . Suicidal ideation 07/26/2017  . Tobacco use disorder 07/26/2017  . Cocaine use disorder, moderate, dependence ([place]) 07/26/2017  . PTSD (post-traumatic stress disorder) 07/24/2017    Past Surgical History:  Procedure Laterality Date  . CESAREAN SECTION       OB History    Gravida  1   Para      Term      Preterm      AB      Living        SAB      TAB       Ectopic      Multiple      Live Births               Home Medications    Prior to Admission medications   Medication Sig Start Date End Date Taking? Authorizing Provider  citalopram (CELEXA) 40 MG tablet Take 1 tablet (40 mg total) by mouth daily. Patient not taking: Reported on 12/08/2017 08/04/17   Marylin Crosby, MD  hydrOXYzine (ATARAX/VISTARIL) 50 MG tablet Take 1 tablet (50 mg total) by mouth every 4 (four) hours as needed for anxiety. Patient not taking: Reported on 12/08/2017 08/03/17   Marylin Crosby, MD  prazosin (MINIPRESS) 2 MG capsule Take 1 capsule (2 mg total) by mouth at bedtime. Patient not taking: Reported on 12/08/2017 08/03/17   Marylin Crosby, MD  risperiDONE (RISPERDAL) 2 MG tablet Take 1 tablet (2 mg total) by mouth 2 (two) times daily. Patient not taking: Reported on 12/08/2017 08/03/17   Marylin Crosby, MD  traZODone (DESYREL) 100 MG tablet Take 2 tablets (200 mg total) by mouth at bedtime. Patient not taking: Reported on 12/08/2017 08/03/17   Marylin Crosby, MD    Family History History reviewed. No pertinent family history.  Social History Social History   Tobacco Use  . Smoking status: Current Every Day Smoker    Types: Cigars  . Smokeless tobacco: Never Used  Substance Use Topics  . Alcohol use: Yes    Alcohol/week: 2.4 oz    Types: 4 Cans of beer per week    Comment: daily  . Drug use: No     Allergies   Patient has no known allergies.   Review of Systems Review of Systems  Constitutional: Negative for chills and fever.  Respiratory: Negative for shortness of breath.   Cardiovascular: Negative for chest pain.  Gastrointestinal: Positive for blood in stool. Negative for constipation, diarrhea, nausea and vomiting.  Genitourinary: Positive for frequency, pelvic pain, urgency and vaginal bleeding. Negative for flank pain and vaginal discharge.  All other systems reviewed and are negative.    Physical Exam Updated Vital  Signs BP (!) 137/104 (BP Location: Right Arm)   Pulse 94   Temp 98.1 F (36.7 C) (Oral)   Resp (!) 25   Ht 5\' 4"  (1.626 m)   Wt 74.5 kg (164 lb 5 oz)   LMP 11/24/2017   SpO2 100%   BMI 28.20 kg/m   Physical Exam  Constitutional: She appears well-developed and well-nourished. She appears distressed (mild secondary to discomfort- patient stating "i really need to use the restroom" ).  HENT:  Head: Normocephalic and atraumatic.  Eyes: Conjunctivae are normal. Right eye exhibits no discharge. Left eye exhibits no discharge.  Cardiovascular: Normal rate and regular rhythm.  No murmur heard. Pulmonary/Chest: Breath sounds normal. No respiratory distress. She has no wheezes. She has no rales.  Abdominal: Soft. She exhibits no distension. There is tenderness in the suprapubic area. There is no rigidity, no rebound, no guarding and no tenderness at McBurney's point.  Genitourinary: Rectal exam shows external hemorrhoid (there are 3 small hemorrhoids present, no active bleeding, no appearance of thrombosis). Rectal exam shows no fissure, no mass and guaiac negative stool. There is no rash or tenderness on the right labia. There is no rash or tenderness on the left labia. Cervix exhibits no discharge and no friability. Cervical motion tenderness: mild. Right adnexum displays no mass and no fullness. Tenderness: mild. Left adnexum displays no mass and no fullness. Tenderness: mild. There is bleeding (minimal) in the vagina.  Genitourinary Comments: Patient uncomfortable throughout pelvic exam including in bilateral adnexal regions and with cervical motion- there is not one area that is significantly more uncomfortable, she states she is having a hard time telling if the pain is worse with bimanual exam or if she is just uncomfortable in general. EDT Precious present as chaperone throughout exam.   Neurological: She is alert.  Clear speech.   Skin: Skin is warm and dry. No rash noted.  Psychiatric: She  has a normal mood and affect. Her behavior is normal.  Nursing note and vitals reviewed.   ED Treatments / Results  Labs Results for orders placed or performed during the hospital encounter of 12/08/17  Wet prep, genital  Result Value Ref Range   Yeast Wet Prep HPF POC NONE SEEN NONE SEEN   Trich, Wet Prep NONE SEEN NONE SEEN   Clue Cells Wet Prep HPF POC PRESENT (A) NONE SEEN   WBC, Wet Prep HPF POC FEW (A) NONE SEEN   Sperm NONE SEEN   Lipase, blood  Result Value Ref Range   Lipase 21 11 - 51 U/L  Comprehensive metabolic panel  Result Value Ref Range   Sodium 139 135 - 145 mmol/L   Potassium 3.7 3.5 - 5.1 mmol/L   Chloride 102 101 - 111 mmol/L   CO2 25  22 - 32 mmol/L   Glucose, Bld 102 (H) 65 - 99 mg/dL   BUN 11 6 - 20 mg/dL   Creatinine, Ser 0.72 0.44 - 1.00 mg/dL   Calcium 8.9 8.9 - 10.3 mg/dL   Total Protein 8.3 (H) 6.5 - 8.1 g/dL   Albumin 4.4 3.5 - 5.0 g/dL   AST 22 15 - 41 U/L   ALT 20 14 - 54 U/L   Alkaline Phosphatase 83 38 - 126 U/L   Total Bilirubin 0.9 0.3 - 1.2 mg/dL   GFR calc non Af Amer >60 >60 mL/min   GFR calc Af Amer >60 >60 mL/min   Anion gap 12 5 - 15  CBC  Result Value Ref Range   WBC 11.1 (H) 4.0 - 10.5 K/uL   RBC 4.25 3.87 - 5.11 MIL/uL   Hemoglobin 13.8 12.0 - 15.0 g/dL   HCT 40.0 36.0 - 46.0 %   MCV 94.1 78.0 - 100.0 fL   MCH 32.5 26.0 - 34.0 pg   MCHC 34.5 30.0 - 36.0 g/dL   RDW 14.6 11.5 - 15.5 %   Platelets 254 150 - 400 K/uL  Urinalysis, Routine w reflex microscopic  Result Value Ref Range   Color, Urine YELLOW YELLOW   APPearance CLOUDY (A) CLEAR   Specific Gravity, Urine 1.020 1.005 - 1.030   pH 6.0 5.0 - 8.0   Glucose, UA NEGATIVE NEGATIVE mg/dL   Hgb urine dipstick LARGE (A) NEGATIVE   Bilirubin Urine NEGATIVE NEGATIVE   Ketones, ur 20 (A) NEGATIVE mg/dL   Protein, ur 100 (A) NEGATIVE mg/dL   Nitrite POSITIVE (A) NEGATIVE   Leukocytes, UA LARGE (A) NEGATIVE   RBC / HPF >50 (H) 0 - 5 RBC/hpf   WBC, UA >50 (H) 0 - 5 WBC/hpf    Bacteria, UA FEW (A) NONE SEEN   Squamous Epithelial / LPF 11-20 0 - 5   Mucus PRESENT   I-Stat beta hCG blood, ED  Result Value Ref Range   I-stat hCG, quantitative <5.0 <5 mIU/mL   Comment 3          POC occult blood, ED Provider will collect  Result Value Ref Range   Fecal Occult Bld NEGATIVE NEGATIVE   No results found. EKG None  Radiology No results found.  Procedures Procedures (including critical care time)  Medications Ordered in ED Medications  phenazopyridine (PYRIDIUM) tablet 200 mg (has no administration in time range)  ibuprofen (ADVIL,MOTRIN) tablet 800 mg (has no administration in time range)   Initial Impression / Assessment and Plan / ED Course  I have reviewed the triage vital signs and the nursing notes.  Pertinent labs & imaging results that were available during my care of the patient were reviewed by me and considered in my medical decision making (see chart for details).   Patient presents with suprapubic discomfort and urinary sxs. Patient is nontoxic appearing, appears somewhat uncomfortable, vitals with HTN- do not suspect HTN emergency. Patient with suprapubic tenderness and diffuse discomfort with pelvic exam. Minimal vaginal bleeding from the cervical os on exam. No discharge. No active rectal bleeding, hemorrhoids present, fecal occult negative- suspect chronic rectal bleeding to be hemorrhoid related at this time,. Lab work notable for nonspecific leukocytosis at 11.1. No anemia.  No significant abnormalities with renal fxn, LFTs, or electrolytes. Beta hCG WNL. Wet prep findings with clue cells and few WBCs- patient without complaints of vaginal discharge/odor. UA is contaminated however it is positive for Nitrites and Leukocytes.  Given patient history & physical suspicions for UTI. Treated in the ED with ibuprofen and pyridium with some improvement in her symptoms.   On repeat exam patient does not have a surgical abdomen and there are no peritoneal  signs. Do not suspect appendicitis, bowel obstruction, bowel perforation, cholecystitis, diverticulitis, ectopic pregnancy, or ovarian torsion at this time. Suspect sxs related to likely UTI. With hx of multiple sexual partners, given diffuse pelvic pain, treated prophylactically for GC/chlamydia with Ceftriaxone and Azithromycin in the ED, cultures pending. Patient given a tramadol prior to DC. I discussed results, treatment plan, need for women's health follow-up, and return precautions with the patient. Provided opportunity for questions, patient confirmed understanding and is in agreement with plan.   Findings and plan of care discussed with supervising physician Dr. Jeanell Sparrow who is in agreement with plan.   Vitals:   12/09/17 0000 12/09/17 0013  BP: 114/86   Pulse: 95   Resp: 14   Temp:  98.2 F (36.8 C)  SpO2: 96%     Final Clinical Impressions(s) / ED Diagnoses   Final diagnoses:  Cystitis    ED Discharge Orders        Ordered    cephALEXin (KEFLEX) 500 MG capsule  2 times daily     12/08/17 2346    naproxen (NAPROSYN) 500 MG tablet  2 times daily     12/08/17 2346       Petrucelli, [place] R, PA-C 12/09/17 0044    Pattricia Boss, MD 12/11/17 [phone]

## 2017-12-08 NOTE — Discharge Instructions (Signed)
You were seen in the emergency department today for pain in your pelvic region.  We suspect this is related to a UTI.  We have treated you prophylactically in the emergency department for possible gonorrhea and chlamydia as this can also cause some of your symptoms.  You have gonorrhea and Chlamydia cultures pending, we will call you if these return positive.  If positive you will need to inform all sexual partners.  Given your urine specimen there is concern for urinary tract infection.  We are going to treat this with Keflex, this is an antibiotic. Please take all of your antibiotics until finished. You may develop abdominal discomfort or diarrhea from the antibiotic.  You may help offset this with probiotics which you can buy at the store (ask your pharmacist if unable to find) or get probiotics in the form of eating yogurt. Do not eat or take the probiotics until 2 hours after your antibiotic. If you are unable to tolerate these side effects follow-up with your primary care provider or return to the emergency department.   If you begin to experience any blistering, rashes, swelling, or difficulty breathing seek medical care for evaluation of potentially more serious side effects.   Please be aware that this medication may interact with other medications you are taking, please be sure to discuss your medication list with your pharmacist.   We have also given you a prescription for naproxen for pain. Naproxen is a nonsteroidal anti-inflammatory medication that will help with pain and swelling. Be sure to take this medication as prescribed with food, 1 pill every 12 hours,  It should be taken with food, as it can cause stomach upset, and more seriously, stomach bleeding. Do not take other nonsteroidal anti-inflammatory medications with this such as Advil, Motrin, or Aleve. As above- discuss this medication with your pharmacist and return for any more serious side effects.   Follow-up with the women[facility]  health clinic regarding her discharge instructions for the next 2-3 days for reevaluation.  Return to the ER anytime for any new or worsening symptoms including but not limited to fever, worsening pain, inability to keep fluids down, or any other concerns.

## 2017-12-09 LAB — GC/CHLAMYDIA PROBE AMP (~~LOC~~) NOT AT ARMC
CHLAMYDIA, DNA PROBE: NEGATIVE
Neisseria Gonorrhea: NEGATIVE

## 2017-12-09 MED ORDER — LIDOCAINE HCL (PF) 1 % IJ SOLN
INTRAMUSCULAR | Status: AC
  Administered 2017-12-09: 0.9 mL
  Filled 2017-12-09: qty 30

## 2018-02-17 ENCOUNTER — Encounter (HOSPITAL_COMMUNITY): Payer: Self-pay | Admitting: *Deleted

## 2018-02-17 ENCOUNTER — Ambulatory Visit (HOSPITAL_COMMUNITY)
Admission: EM | Admit: 2018-02-17 | Discharge: 2018-02-17 | Disposition: A | Discharge status: Home / Self Care | Payer: Self-pay | Attending: Internal Medicine | Admitting: Internal Medicine

## 2018-02-17 DIAGNOSIS — R358 Other polyuria: Secondary | ICD-10-CM

## 2018-02-17 DIAGNOSIS — R109 Unspecified abdominal pain: Secondary | ICD-10-CM

## 2018-02-17 DIAGNOSIS — J020 Streptococcal pharyngitis: Secondary | ICD-10-CM | POA: Insufficient documentation

## 2018-02-17 DIAGNOSIS — Z3202 Encounter for pregnancy test, result negative: Secondary | ICD-10-CM

## 2018-02-17 LAB — POCT URINALYSIS DIP (DEVICE)
BILIRUBIN URINE: NEGATIVE
Glucose, UA: NEGATIVE mg/dL
Hgb urine dipstick: NEGATIVE
Ketones, ur: NEGATIVE mg/dL
LEUKOCYTES UA: NEGATIVE
Nitrite: NEGATIVE
PH: 6 (ref 5.0–8.0)
Protein, ur: NEGATIVE mg/dL
SPECIFIC GRAVITY, URINE: 1.01 (ref 1.005–1.030)
UROBILINOGEN UA: 0.2 mg/dL (ref 0.0–1.0)

## 2018-02-17 LAB — POCT RAPID STREP A: Streptococcus, Group A Screen (Direct): POSITIVE — AB

## 2018-02-17 LAB — POCT PREGNANCY, URINE: Preg Test, Ur: NEGATIVE

## 2018-02-17 MED ORDER — ACETAMINOPHEN 325 MG PO TABS
650.0000 mg | ORAL_TABLET | Freq: Once | ORAL | Status: AC
  Administered 2018-02-17: 650 mg via ORAL

## 2018-02-17 MED ORDER — PENICILLIN V POTASSIUM 500 MG PO TABS: 500.0000 mg | ORAL_TABLET | Freq: Three times a day (TID) | ORAL | 0 refills | Status: DC

## 2018-02-17 MED ORDER — PHENAZOPYRIDINE HCL 200 MG PO TABS: 200.0000 mg | ORAL_TABLET | Freq: Three times a day (TID) | ORAL | 0 refills | Status: DC

## 2018-02-17 MED ORDER — ACETAMINOPHEN 325 MG PO TABS
ORAL_TABLET | ORAL | Status: AC
  Filled 2018-02-17: qty 2

## 2018-02-17 NOTE — ED Triage Notes (Signed)
Patient states her throat hurts and she thinks she has a UTI. Throat pain x 1 day. Patient reports pressure to abdomen and polyuria.

## 2018-02-18 LAB — HIV ANTIBODY (ROUTINE TESTING W REFLEX): HIV Screen 4th Generation wRfx: NONREACTIVE

## 2018-02-19 ENCOUNTER — Emergency Department (HOSPITAL_COMMUNITY)
Admission: EM | Admit: 2018-02-19 | Discharge: 2018-02-19 | Disposition: A | Discharge status: Home / Self Care | Payer: Self-pay | Attending: Emergency Medicine | Admitting: Emergency Medicine

## 2018-02-19 ENCOUNTER — Emergency Department (HOSPITAL_COMMUNITY): Payer: Self-pay

## 2018-02-19 ENCOUNTER — Other Ambulatory Visit: Payer: Self-pay

## 2018-02-19 DIAGNOSIS — Y998 Other external cause status: Secondary | ICD-10-CM | POA: Insufficient documentation

## 2018-02-19 DIAGNOSIS — Y939 Activity, unspecified: Secondary | ICD-10-CM | POA: Insufficient documentation

## 2018-02-19 DIAGNOSIS — Y929 Unspecified place or not applicable: Secondary | ICD-10-CM | POA: Insufficient documentation

## 2018-02-19 DIAGNOSIS — S1191XA Laceration without foreign body of unspecified part of neck, initial encounter: Secondary | ICD-10-CM | POA: Insufficient documentation

## 2018-02-19 DIAGNOSIS — S0101XA Laceration without foreign body of scalp, initial encounter: Secondary | ICD-10-CM | POA: Insufficient documentation

## 2018-02-19 LAB — CBC WITH DIFFERENTIAL/PLATELET
Abs Immature Granulocytes: 0 10*3/uL (ref 0.0–0.1)
BASOS ABS: 0.1 10*3/uL (ref 0.0–0.1)
Basophils Relative: 0 %
EOS ABS: 0 10*3/uL (ref 0.0–0.7)
EOS PCT: 0 %
HCT: 34.2 % — ABNORMAL LOW (ref 36.0–46.0)
Hemoglobin: 11.2 g/dL — ABNORMAL LOW (ref 12.0–15.0)
IMMATURE GRANULOCYTES: 0 %
LYMPHS PCT: 25 %
Lymphs Abs: 2.8 10*3/uL (ref 0.7–4.0)
MCH: 31.7 pg (ref 26.0–34.0)
MCHC: 32.7 g/dL (ref 30.0–36.0)
MCV: 96.9 fL (ref 78.0–100.0)
Monocytes Absolute: 0.8 10*3/uL (ref 0.1–1.0)
Monocytes Relative: 7 %
NEUTROS PCT: 68 %
Neutro Abs: 7.7 10*3/uL (ref 1.7–7.7)
Platelets: 309 10*3/uL (ref 150–400)
RBC: 3.53 MIL/uL — AB (ref 3.87–5.11)
RDW: 14.6 % (ref 11.5–15.5)
WBC: 11.4 10*3/uL — ABNORMAL HIGH (ref 4.0–10.5)

## 2018-02-19 LAB — TYPE AND SCREEN
ABO/RH(D): A POS
Antibody Screen: NEGATIVE
UNIT DIVISION: 0
Unit division: 0

## 2018-02-19 LAB — BPAM RBC
BLOOD PRODUCT EXPIRATION DATE: 201907222359
Blood Product Expiration Date: 201907302359
ISSUE DATE / TIME: 201907050855
ISSUE DATE / TIME: 201907050855
UNIT TYPE AND RH: 9500
Unit Type and Rh: 9500

## 2018-02-19 LAB — PREPARE FRESH FROZEN PLASMA
UNIT DIVISION: 0
Unit division: 0

## 2018-02-19 LAB — COMPREHENSIVE METABOLIC PANEL
ALT: 20 U/L (ref 0–44)
AST: 29 U/L (ref 15–41)
Albumin: 3.7 g/dL (ref 3.5–5.0)
Alkaline Phosphatase: 71 U/L (ref 38–126)
Anion gap: 14 (ref 5–15)
BILIRUBIN TOTAL: 0.5 mg/dL (ref 0.3–1.2)
BUN: 12 mg/dL (ref 6–20)
CO2: 16 mmol/L — ABNORMAL LOW (ref 22–32)
Calcium: 8.3 mg/dL — ABNORMAL LOW (ref 8.9–10.3)
Chloride: 111 mmol/L (ref 98–111)
Creatinine, Ser: 1.01 mg/dL — ABNORMAL HIGH (ref 0.44–1.00)
Glucose, Bld: 157 mg/dL — ABNORMAL HIGH (ref 70–99)
POTASSIUM: 3 mmol/L — AB (ref 3.5–5.1)
Sodium: 141 mmol/L (ref 135–145)
TOTAL PROTEIN: 7.1 g/dL (ref 6.5–8.1)

## 2018-02-19 LAB — BPAM FFP
BLOOD PRODUCT EXPIRATION DATE: 201907152359
BLOOD PRODUCT EXPIRATION DATE: 201907222359
ISSUE DATE / TIME: 201907050857
ISSUE DATE / TIME: 201907050857
UNIT TYPE AND RH: 6200
UNIT TYPE AND RH: 6200

## 2018-02-19 LAB — URINE CYTOLOGY ANCILLARY ONLY
Chlamydia: NEGATIVE
Neisseria Gonorrhea: NEGATIVE

## 2018-02-19 LAB — I-STAT BETA HCG BLOOD, ED (MC, WL, AP ONLY)

## 2018-02-19 LAB — ABO/RH: ABO/RH(D): A POS

## 2018-02-19 MED ORDER — SODIUM CHLORIDE 0.9 % IV BOLUS
1000.0000 mL | Freq: Once | INTRAVENOUS | Status: AC
  Administered 2018-02-19: 1000 mL via INTRAVENOUS

## 2018-02-19 MED ORDER — LORAZEPAM 1 MG PO TABS
0.5000 mg | ORAL_TABLET | Freq: Once | ORAL | Status: AC
  Administered 2018-02-19: 0.5 mg via ORAL
  Filled 2018-02-19: qty 1

## 2018-02-19 MED ORDER — HYDROCODONE-ACETAMINOPHEN 5-325 MG PO TABS: 1.0000 | ORAL_TABLET | ORAL | 0 refills | Status: DC | PRN

## 2018-02-19 MED ORDER — MORPHINE SULFATE (PF) 4 MG/ML IV SOLN
4.0000 mg | Freq: Once | INTRAVENOUS | Status: AC
  Administered 2018-02-19: 4 mg via INTRAVENOUS
  Filled 2018-02-19: qty 1

## 2018-02-19 MED ORDER — LIDOCAINE-EPINEPHRINE (PF) 2 %-1:200000 IJ SOLN
10.0000 mL | Freq: Once | INTRAMUSCULAR | Status: AC
  Administered 2018-02-19: 10 mL
  Filled 2018-02-19: qty 20

## 2018-02-19 MED ORDER — LORAZEPAM 2 MG/ML IJ SOLN: 0.5000 mg | Freq: Once | INTRAMUSCULAR | Status: DC

## 2018-02-19 MED ORDER — ONDANSETRON HCL 4 MG/2ML IJ SOLN
4.0000 mg | Freq: Once | INTRAMUSCULAR | Status: AC
  Administered 2018-02-19: 4 mg via INTRAVENOUS

## 2018-02-19 MED ORDER — ONDANSETRON HCL 4 MG/2ML IJ SOLN
INTRAMUSCULAR | Status: AC
  Filled 2018-02-19: qty 2

## 2018-02-19 NOTE — ED Provider Notes (Signed)
..  Laceration Repair Date/Time: 02/19/2018 11:13 AM Performed by: Carlisle Cater, PA-C Authorized by: Carlisle Cater, PA-C   Consent:    Consent obtained:  Verbal   Consent given by:  Patient   Risks discussed:  Pain and infection Anesthesia (see MAR for exact dosages):    Anesthesia method:  Local infiltration   Local anesthetic:  Lidocaine 2% WITH epi Laceration details:    Location:  Neck   Length (cm):  5 Repair type:    Repair type:  Simple Pre-procedure details:    Preparation:  Patient was prepped and draped in usual sterile fashion Exploration:    Hemostasis achieved with:  Epinephrine and direct pressure   Wound exploration: entire depth of wound probed and visualized     Contaminated: no   Treatment:    Area cleansed with:  Shur-Clens and saline   Amount of cleaning:  Extensive Skin repair:    Repair method:  Sutures   Suture size:  4-0   Suture material:  Nylon   Suture technique:  Running locked and simple interrupted   Number of sutures:  8 Approximation:    Approximation:  Close Post-procedure details:    Dressing:  Open (no dressing)   Patient tolerance of procedure:  Tolerated well, no immediate complications .Marland KitchenLaceration Repair Date/Time: 02/19/2018 11:14 AM Performed by: Carlisle Cater, PA-C Authorized by: Carlisle Cater, PA-C   Consent:    Consent obtained:  Verbal   Consent given by:  Patient   Risks discussed:  Infection and pain Anesthesia (see MAR for exact dosages):    Anesthesia method:  Local infiltration   Local anesthetic:  Lidocaine 2% WITH epi Laceration details:    Location:  Scalp   Scalp location:  Occipital   Length (cm):  4 Repair type:    Repair type:  Simple Pre-procedure details:    Preparation:  Patient was prepped and draped in usual sterile fashion Exploration:    Hemostasis achieved with:  Epinephrine and direct pressure   Wound exploration: entire depth of wound probed and visualized     Contaminated: no   Treatment:     Area cleansed with:  Shur-Clens   Amount of cleaning:  Standard Skin repair:    Repair method:  Staples   Number of staples:  7 Approximation:    Approximation:  Close Post-procedure details:    Dressing:  Open (no dressing)   Patient tolerance of procedure:  Tolerated well, no immediate complications      Carlisle Cater, PA-C 02/19/18 1115    Isla Pence, MD 02/19/18 1416

## 2018-02-19 NOTE — ED Notes (Signed)
Pt ambulated to bathroom without assistance. Pt had taken IV in left arm out, unhooked self from all of the monitor leads.
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
## 2018-02-19 NOTE — ED Notes (Signed)
Provided patient with ice water per MD okay.
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
## 2018-02-19 NOTE — ED Notes (Signed)
This RN with patient to CT 3.

## 2018-02-19 NOTE — ED Notes (Signed)
Pt sitting on edge of bed, c/o dizziness- placed back into bed and given water-
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
## 2018-02-19 NOTE — ED Notes (Signed)
Patient requesting to know where her fiance is - stated in front of social worker, "if I leave here without him, I'm going to hurt myself." MD aware.

## 2018-02-19 NOTE — ED Notes (Signed)
Pt crying/tearful-- states "I am going home to get my kids together - and I may come back-- I think I need to talk with someone" spoke with dr Gilford Raid again --0 denies any SI/HI--

## 2018-02-19 NOTE — ED Notes (Signed)
EDP at bedside, suturing patient's wounds.

## 2018-02-19 NOTE — ED Notes (Signed)
Patient transported to x-ray. ?

## 2018-02-25 ENCOUNTER — Inpatient Hospital Stay (HOSPITAL_COMMUNITY): Payer: Self-pay | Admitting: Anesthesiology

## 2018-02-25 ENCOUNTER — Emergency Department (HOSPITAL_COMMUNITY): Payer: Self-pay

## 2018-02-25 ENCOUNTER — Other Ambulatory Visit: Payer: Self-pay | Admitting: Orthopedic Surgery

## 2018-02-25 ENCOUNTER — Encounter (HOSPITAL_COMMUNITY)
Admission: EM | Disposition: A | Discharge status: Other Acute Inpatient Hospital | Payer: Self-pay | Source: Home / Self Care | Attending: Internal Medicine

## 2018-02-25 ENCOUNTER — Inpatient Hospital Stay (HOSPITAL_COMMUNITY)
Admission: EM | Admit: 2018-02-25 | Discharge: 2018-02-27 | DRG: 580 | Disposition: A | Discharge status: Other Acute Inpatient Hospital | Payer: Self-pay | Attending: Internal Medicine | Admitting: Internal Medicine

## 2018-02-25 ENCOUNTER — Encounter (HOSPITAL_COMMUNITY): Payer: Self-pay | Admitting: Emergency Medicine

## 2018-02-25 DIAGNOSIS — E876 Hypokalemia: Secondary | ICD-10-CM

## 2018-02-25 DIAGNOSIS — F17290 Nicotine dependence, other tobacco product, uncomplicated: Secondary | ICD-10-CM | POA: Diagnosis present

## 2018-02-25 DIAGNOSIS — L089 Local infection of the skin and subcutaneous tissue, unspecified: Secondary | ICD-10-CM

## 2018-02-25 DIAGNOSIS — T148XXA Other injury of unspecified body region, initial encounter: Secondary | ICD-10-CM

## 2018-02-25 DIAGNOSIS — D539 Nutritional anemia, unspecified: Secondary | ICD-10-CM | POA: Diagnosis present

## 2018-02-25 DIAGNOSIS — R45851 Suicidal ideations: Secondary | ICD-10-CM

## 2018-02-25 DIAGNOSIS — F329 Major depressive disorder, single episode, unspecified: Secondary | ICD-10-CM | POA: Diagnosis present

## 2018-02-25 DIAGNOSIS — D638 Anemia in other chronic diseases classified elsewhere: Secondary | ICD-10-CM | POA: Diagnosis present

## 2018-02-25 DIAGNOSIS — S61212A Laceration without foreign body of right middle finger without damage to nail, initial encounter: Secondary | ICD-10-CM | POA: Diagnosis present

## 2018-02-25 DIAGNOSIS — B9561 Methicillin susceptible Staphylococcus aureus infection as the cause of diseases classified elsewhere: Secondary | ICD-10-CM | POA: Diagnosis present

## 2018-02-25 DIAGNOSIS — L03011 Cellulitis of right finger: Principal | ICD-10-CM | POA: Diagnosis present

## 2018-02-25 DIAGNOSIS — S01412A Laceration without foreign body of left cheek and temporomandibular area, initial encounter: Secondary | ICD-10-CM | POA: Diagnosis present

## 2018-02-25 DIAGNOSIS — X781XXA Intentional self-harm by knife, initial encounter: Secondary | ICD-10-CM | POA: Diagnosis present

## 2018-02-25 DIAGNOSIS — F17210 Nicotine dependence, cigarettes, uncomplicated: Secondary | ICD-10-CM | POA: Diagnosis present

## 2018-02-25 DIAGNOSIS — D649 Anemia, unspecified: Secondary | ICD-10-CM

## 2018-02-25 DIAGNOSIS — Z833 Family history of diabetes mellitus: Secondary | ICD-10-CM

## 2018-02-25 DIAGNOSIS — F1210 Cannabis abuse, uncomplicated: Secondary | ICD-10-CM | POA: Diagnosis present

## 2018-02-25 DIAGNOSIS — Z915 Personal history of self-harm: Secondary | ICD-10-CM

## 2018-02-25 DIAGNOSIS — F4310 Post-traumatic stress disorder, unspecified: Secondary | ICD-10-CM | POA: Diagnosis present

## 2018-02-25 DIAGNOSIS — L0390 Cellulitis, unspecified: Secondary | ICD-10-CM | POA: Diagnosis present

## 2018-02-25 DIAGNOSIS — F1020 Alcohol dependence, uncomplicated: Secondary | ICD-10-CM | POA: Diagnosis present

## 2018-02-25 DIAGNOSIS — F1490 Cocaine use, unspecified, uncomplicated: Secondary | ICD-10-CM | POA: Diagnosis present

## 2018-02-25 DIAGNOSIS — B954 Other streptococcus as the cause of diseases classified elsewhere: Secondary | ICD-10-CM | POA: Diagnosis present

## 2018-02-25 DIAGNOSIS — Y907 Blood alcohol level of 200-239 mg/100 ml: Secondary | ICD-10-CM | POA: Diagnosis present

## 2018-02-25 DIAGNOSIS — F419 Anxiety disorder, unspecified: Secondary | ICD-10-CM | POA: Diagnosis present

## 2018-02-25 DIAGNOSIS — Z590 Homelessness: Secondary | ICD-10-CM

## 2018-02-25 HISTORY — PX: I & D EXTREMITY: SHX5045

## 2018-02-25 LAB — CBC
HEMATOCRIT: 28 % — AB (ref 36.0–46.0)
Hemoglobin: 9 g/dL — ABNORMAL LOW (ref 12.0–15.0)
MCH: 31.5 pg (ref 26.0–34.0)
MCHC: 32.1 g/dL (ref 30.0–36.0)
MCV: 97.9 fL (ref 78.0–100.0)
PLATELETS: 344 10*3/uL (ref 150–400)
RBC: 2.86 MIL/uL — ABNORMAL LOW (ref 3.87–5.11)
RDW: 15.6 % — AB (ref 11.5–15.5)
WBC: 12 10*3/uL — AB (ref 4.0–10.5)

## 2018-02-25 LAB — ETHANOL: ALCOHOL ETHYL (B): 206 mg/dL — AB (ref <10)

## 2018-02-25 LAB — SALICYLATE LEVEL

## 2018-02-25 LAB — COMPREHENSIVE METABOLIC PANEL
ALBUMIN: 3.6 g/dL (ref 3.5–5.0)
ALK PHOS: 81 U/L (ref 38–126)
ALT: 18 U/L (ref 0–44)
ANION GAP: 11 (ref 5–15)
AST: 22 U/L (ref 15–41)
BILIRUBIN TOTAL: 0.5 mg/dL (ref 0.3–1.2)
BUN: 6 mg/dL (ref 6–20)
CALCIUM: 8.5 mg/dL — AB (ref 8.9–10.3)
CO2: 25 mmol/L (ref 22–32)
Chloride: 104 mmol/L (ref 98–111)
Creatinine, Ser: 0.9 mg/dL (ref 0.44–1.00)
GFR calc non Af Amer: >60 mL/min (ref >60)
GLUCOSE: 102 mg/dL — AB (ref 70–99)
Potassium: 3.2 mmol/L — ABNORMAL LOW (ref 3.5–5.1)
Sodium: 140 mmol/L (ref 135–145)
TOTAL PROTEIN: 6.8 g/dL (ref 6.5–8.1)

## 2018-02-25 LAB — RAPID URINE DRUG SCREEN, HOSP PERFORMED
Amphetamines: NOT DETECTED
BENZODIAZEPINES: NOT DETECTED
COCAINE: POSITIVE — AB
OPIATES: NOT DETECTED
Tetrahydrocannabinol: NOT DETECTED

## 2018-02-25 LAB — SURGICAL PCR SCREEN
MRSA, PCR: NEGATIVE
Staphylococcus aureus: NEGATIVE

## 2018-02-25 LAB — I-STAT BETA HCG BLOOD, ED (MC, WL, AP ONLY): I-stat hCG, quantitative: <5 m[IU]/mL (ref <5)

## 2018-02-25 LAB — FERRITIN: Ferritin: 42 ng/mL (ref 11–307)

## 2018-02-25 LAB — ACETAMINOPHEN LEVEL

## 2018-02-25 SURGERY — IRRIGATION AND DEBRIDEMENT EXTREMITY
Anesthesia: General | Site: Hand | Laterality: Right

## 2018-02-25 MED ORDER — LIDOCAINE 2% (20 MG/ML) 5 ML SYRINGE
INTRAMUSCULAR | Status: AC
  Filled 2018-02-25: qty 5

## 2018-02-25 MED ORDER — RAMELTEON 8 MG PO TABS
8.0000 mg | ORAL_TABLET | Freq: Once | ORAL | Status: AC | PRN
  Administered 2018-02-25: 8 mg via ORAL
  Filled 2018-02-25: qty 1

## 2018-02-25 MED ORDER — BUPIVACAINE HCL (PF) 0.25 % IJ SOLN
INTRAMUSCULAR | Status: AC
  Filled 2018-02-25: qty 30

## 2018-02-25 MED ORDER — MIDAZOLAM HCL 2 MG/2ML IJ SOLN
INTRAMUSCULAR | Status: AC
  Filled 2018-02-25: qty 2

## 2018-02-25 MED ORDER — VANCOMYCIN HCL IN DEXTROSE 1-5 GM/200ML-% IV SOLN
1000.0000 mg | Freq: Two times a day (BID) | INTRAVENOUS | Status: DC
  Administered 2018-02-25 – 2018-02-26 (×3): 1000 mg via INTRAVENOUS
  Filled 2018-02-25 (×3): qty 200

## 2018-02-25 MED ORDER — POTASSIUM CHLORIDE 10 MEQ/100ML IV SOLN
10.0000 meq | INTRAVENOUS | Status: AC
  Administered 2018-02-25 (×6): 10 meq via INTRAVENOUS
  Filled 2018-02-25 (×6): qty 100

## 2018-02-25 MED ORDER — OXYCODONE HCL 5 MG PO TABS
ORAL_TABLET | ORAL | Status: AC
  Filled 2018-02-25: qty 1

## 2018-02-25 MED ORDER — OXYCODONE HCL 5 MG PO TABS
5.0000 mg | ORAL_TABLET | Freq: Once | ORAL | Status: AC | PRN
  Administered 2018-02-25: 5 mg via ORAL

## 2018-02-25 MED ORDER — PROPOFOL 10 MG/ML IV BOLUS
INTRAVENOUS | Status: AC
  Filled 2018-02-25: qty 20

## 2018-02-25 MED ORDER — FENTANYL CITRATE (PF) 250 MCG/5ML IJ SOLN
INTRAMUSCULAR | Status: AC
  Filled 2018-02-25: qty 5

## 2018-02-25 MED ORDER — PROPOFOL 10 MG/ML IV BOLUS
INTRAVENOUS | Status: DC | PRN
  Administered 2018-02-25: 200 mg via INTRAVENOUS

## 2018-02-25 MED ORDER — SODIUM CHLORIDE 0.9 % IV SOLN
3.0000 g | Freq: Once | INTRAVENOUS | Status: AC
  Administered 2018-02-25: 3 g via INTRAVENOUS
  Filled 2018-02-25: qty 3

## 2018-02-25 MED ORDER — FENTANYL CITRATE (PF) 100 MCG/2ML IJ SOLN
25.0000 ug | INTRAMUSCULAR | Status: DC | PRN
  Administered 2018-02-25: 25 ug via INTRAVENOUS

## 2018-02-25 MED ORDER — ENOXAPARIN SODIUM 40 MG/0.4ML ~~LOC~~ SOLN
40.0000 mg | SUBCUTANEOUS | Status: DC
  Administered 2018-02-26 – 2018-02-27 (×2): 40 mg via SUBCUTANEOUS
  Filled 2018-02-25 (×3): qty 0.4

## 2018-02-25 MED ORDER — SODIUM CHLORIDE 0.9 % IV SOLN
1.0000 g | Freq: Three times a day (TID) | INTRAVENOUS | Status: DC
  Administered 2018-02-25 – 2018-02-27 (×6): 1 g via INTRAVENOUS
  Filled 2018-02-25 (×8): qty 1

## 2018-02-25 MED ORDER — LACTATED RINGERS IV SOLN
INTRAVENOUS | Status: DC | PRN
  Administered 2018-02-25: 17:00:00 via INTRAVENOUS

## 2018-02-25 MED ORDER — CHLORHEXIDINE GLUCONATE 4 % EX LIQD: 60.0000 mL | Freq: Once | CUTANEOUS | Status: DC

## 2018-02-25 MED ORDER — OXYCODONE HCL 5 MG/5ML PO SOLN: 5.0000 mg | Freq: Once | ORAL | Status: AC | PRN

## 2018-02-25 MED ORDER — ONDANSETRON HCL 4 MG/2ML IJ SOLN
INTRAMUSCULAR | Status: DC | PRN
  Administered 2018-02-25: 4 mg via INTRAVENOUS

## 2018-02-25 MED ORDER — FENTANYL CITRATE (PF) 100 MCG/2ML IJ SOLN
INTRAMUSCULAR | Status: AC
  Filled 2018-02-25: qty 2

## 2018-02-25 MED ORDER — LIDOCAINE HCL (CARDIAC) PF 100 MG/5ML IV SOSY
PREFILLED_SYRINGE | INTRAVENOUS | Status: DC | PRN
  Administered 2018-02-25: 60 mg via INTRAVENOUS

## 2018-02-25 MED ORDER — IBUPROFEN 400 MG PO TABS
400.0000 mg | ORAL_TABLET | Freq: Once | ORAL | Status: AC
  Administered 2018-02-25: 400 mg via ORAL
  Filled 2018-02-25: qty 1

## 2018-02-25 MED ORDER — VANCOMYCIN HCL IN DEXTROSE 1-5 GM/200ML-% IV SOLN: 1000.0000 mg | Freq: Once | INTRAVENOUS | Status: DC

## 2018-02-25 MED ORDER — KETOROLAC TROMETHAMINE 15 MG/ML IJ SOLN
15.0000 mg | Freq: Four times a day (QID) | INTRAMUSCULAR | Status: DC | PRN
  Administered 2018-02-25 – 2018-02-26 (×4): 15 mg via INTRAVENOUS
  Filled 2018-02-25 (×4): qty 1

## 2018-02-25 MED ORDER — MIDAZOLAM HCL 5 MG/5ML IJ SOLN
INTRAMUSCULAR | Status: DC | PRN
  Administered 2018-02-25: 2 mg via INTRAVENOUS

## 2018-02-25 MED ORDER — SODIUM CHLORIDE 0.9 % IR SOLN
Status: DC | PRN
  Administered 2018-02-25: 1000 mL

## 2018-02-25 MED ORDER — FENTANYL CITRATE (PF) 100 MCG/2ML IJ SOLN
INTRAMUSCULAR | Status: DC | PRN
  Administered 2018-02-25 (×3): 50 ug via INTRAVENOUS

## 2018-02-25 MED ORDER — BUPIVACAINE HCL (PF) 0.25 % IJ SOLN
INTRAMUSCULAR | Status: DC | PRN
  Administered 2018-02-25: 3 mL

## 2018-02-25 SURGICAL SUPPLY — 46 items
BANDAGE ACE 3X5.8 VEL STRL LF (GAUZE/BANDAGES/DRESSINGS) ×3 IMPLANT
BANDAGE ACE 4X5 VEL STRL LF (GAUZE/BANDAGES/DRESSINGS) ×3 IMPLANT
BNDG CMPR 9X4 STRL LF SNTH (GAUZE/BANDAGES/DRESSINGS)
BNDG COHESIVE 1X5 TAN STRL LF (GAUZE/BANDAGES/DRESSINGS) ×2 IMPLANT
BNDG CONFORM 2 STRL LF (GAUZE/BANDAGES/DRESSINGS) IMPLANT
BNDG ESMARK 4X9 LF (GAUZE/BANDAGES/DRESSINGS) IMPLANT
BNDG GAUZE ELAST 4 BULKY (GAUZE/BANDAGES/DRESSINGS) ×6 IMPLANT
CORDS BIPOLAR (ELECTRODE) IMPLANT
COVER SURGICAL LIGHT HANDLE (MISCELLANEOUS) ×6 IMPLANT
CUFF TOURNIQUET SINGLE 18IN (TOURNIQUET CUFF) ×3 IMPLANT
DECANTER SPIKE VIAL GLASS SM (MISCELLANEOUS) ×3 IMPLANT
DRAPE SURG 17X23 STRL (DRAPES) ×3 IMPLANT
DURAPREP 26ML APPLICATOR (WOUND CARE) ×3 IMPLANT
GAUZE PACKING IODOFORM 1/4X15 (GAUZE/BANDAGES/DRESSINGS) ×2 IMPLANT
GAUZE PACKING IODOFORM 1/4X5 (PACKING) IMPLANT
GAUZE SPONGE 4X4 12PLY STRL (GAUZE/BANDAGES/DRESSINGS) ×6 IMPLANT
GAUZE XEROFORM 1X8 LF (GAUZE/BANDAGES/DRESSINGS) ×3 IMPLANT
GLOVE SURG SYN 8.0 (GLOVE) ×3 IMPLANT
GLOVE SURG SYN 8.0 PF PI (GLOVE) ×1 IMPLANT
GOWN STRL REUS W/ TWL LRG LVL3 (GOWN DISPOSABLE) ×1 IMPLANT
GOWN STRL REUS W/ TWL XL LVL3 (GOWN DISPOSABLE) ×1 IMPLANT
GOWN STRL REUS W/TWL LRG LVL3 (GOWN DISPOSABLE) ×3
GOWN STRL REUS W/TWL XL LVL3 (GOWN DISPOSABLE) ×3
KIT BASIN OR (CUSTOM PROCEDURE TRAY) ×3 IMPLANT
KIT TURNOVER KIT B (KITS) ×3 IMPLANT
MANIFOLD NEPTUNE II (INSTRUMENTS) ×3 IMPLANT
NDL HYPO 25GX1X1/2 BEV (NEEDLE) IMPLANT
NDL HYPO 25X1 1.5 SAFETY (NEEDLE) ×1 IMPLANT
NEEDLE HYPO 25GX1X1/2 BEV (NEEDLE) IMPLANT
NEEDLE HYPO 25X1 1.5 SAFETY (NEEDLE) ×3 IMPLANT
NS IRRIG 1000ML POUR BTL (IV SOLUTION) ×3 IMPLANT
PACK ORTHO EXTREMITY (CUSTOM PROCEDURE TRAY) ×3 IMPLANT
PAD ARMBOARD 7.5X6 YLW CONV (MISCELLANEOUS) ×6 IMPLANT
PAD CAST 4YDX4 CTTN HI CHSV (CAST SUPPLIES) ×2 IMPLANT
PADDING CAST COTTON 4X4 STRL (CAST SUPPLIES) ×6
SPONGE LAP 18X18 X RAY DECT (DISPOSABLE) ×3 IMPLANT
SUT VICRYL RAPIDE 4/0 PS 2 (SUTURE) IMPLANT
SWAB COLLECTION DEVICE MRSA (MISCELLANEOUS) ×3 IMPLANT
SWAB CULTURE ESWAB REG 1ML (MISCELLANEOUS) IMPLANT
SYR CONTROL 10ML LL (SYRINGE) ×3 IMPLANT
TOWEL OR 17X24 6PK STRL BLUE (TOWEL DISPOSABLE) ×3 IMPLANT
TOWEL OR 17X26 10 PK STRL BLUE (TOWEL DISPOSABLE) ×3 IMPLANT
TUBE CONNECTING 12'X1/4 (SUCTIONS) ×1
TUBE CONNECTING 12X1/4 (SUCTIONS) ×2 IMPLANT
UNDERPAD 30X30 (UNDERPADS AND DIAPERS) ×3 IMPLANT
YANKAUER SUCT BULB TIP NO VENT (SUCTIONS) ×3 IMPLANT

## 2018-02-25 NOTE — Consult Note (Signed)
Reason for Consult:right long infection Referring Physician: wicklione  Denise Brandt is an 37 y.o. female.  HPI: with h/o right long dorsal pip drainage for 24-48 hours  Past Medical History:  Diagnosis Date  . Depression   . Ovarian cyst     Past Surgical History:  Procedure Laterality Date  . CESAREAN SECTION      No family history on file.  Social History:  reports that she has been smoking cigars.  She has never used smokeless tobacco. She reports that she drinks about 2.4 oz of alcohol per week. She reports that she does not use drugs.  Allergies: No Known Allergies  Medications: Scheduled:   Results for orders placed or performed during the hospital encounter of 02/25/18 (from the past 48 hour(s))  Rapid urine drug screen (hospital performed)     Status: Abnormal   Collection Time: 02/25/18 12:37 AM  Result Value Ref Range   Opiates NONE DETECTED NONE DETECTED   Cocaine POSITIVE (A) NONE DETECTED   Benzodiazepines NONE DETECTED NONE DETECTED   Amphetamines NONE DETECTED NONE DETECTED   Tetrahydrocannabinol NONE DETECTED NONE DETECTED   Barbiturates (A) NONE DETECTED    Result not available. Reagent lot number recalled by manufacturer.    Comment: Performed at [facility], [place] [address], [place], [place] [address]  Comprehensive metabolic panel     Status: Abnormal   Collection Time: 02/25/18 12:41 AM  Result Value Ref Range   Sodium 140 135 - 145 mmol/L   Potassium 3.2 (L) 3.5 - 5.1 mmol/L   Chloride 104 98 - 111 mmol/L    Comment: Please note change in reference range.   CO2 25 22 - 32 mmol/L   Glucose, Bld 102 (H) 70 - 99 mg/dL    Comment: Please note change in reference range.   BUN 6 6 - 20 mg/dL    Comment: Please note change in reference range.   Creatinine, Ser 0.90 0.44 - 1.00 mg/dL   Calcium 8.5 (L) 8.9 - 10.3 mg/dL   Total Protein 6.8 6.5 - 8.1 g/dL   Albumin 3.6 3.5 - 5.0 g/dL   AST 22 15 - 41 U/L   ALT 18 0 - 44 U/L    Comment:  Please note change in reference range.   Alkaline Phosphatase 81 38 - 126 U/L   Total Bilirubin 0.5 0.3 - 1.2 mg/dL   GFR calc non Af Amer >60 >60 mL/min   GFR calc Af Amer >60 >60 mL/min    Comment: (NOTE) The eGFR has been calculated using the CKD EPI equation. This calculation has not been validated in all clinical situations. eGFR's persistently <60 mL/min signify possible Chronic Kidney Disease.    Anion gap 11 5 - 15    Comment: Performed at [facility] [address] [place], [place] [address]  Ethanol     Status: Abnormal   Collection Time: 02/25/18 12:41 AM  Result Value Ref Range   Alcohol, Ethyl (B) 206 (H) <10 mg/dL    Comment: (NOTE) Lowest detectable limit for serum alcohol is 10 mg/dL. For medical purposes only. Performed at [facility], [place] [address], [place], [place] [address]   Salicylate level     Status: None   Collection Time: 02/25/18 12:41 AM  Result Value Ref Range   Salicylate Lvl <8.5 2.8 - 30.0 mg/dL    Comment: Performed at [facility] [address] [place], [place] [address]  Acetaminophen  level     Status: Abnormal   Collection Time: 02/25/18 12:41 AM  Result Value Ref Range   Acetaminophen (Tylenol), Serum <10 (L) 10 - 30 ug/mL    Comment: (NOTE) Therapeutic concentrations vary significantly. A range of 10-30 ug/mL  may be an effective concentration for many patients. However, some  are best treated at concentrations outside of this range. Acetaminophen concentrations >150 ug/mL at 4 hours after ingestion  and >50 ug/mL at 12 hours after ingestion are often associated with  toxic reactions. Performed at [facility], [place] [address], [place], [place] [address]   cbc     Status: Abnormal   Collection Time: 02/25/18 12:41 AM  Result Value Ref Range   WBC 12.0 (H) 4.0 - 10.5 K/uL   RBC 2.86 (L) 3.87 - 5.11 MIL/uL   Hemoglobin 9.0 (L) 12.0 - 15.0 g/dL   HCT 28.0 (L) 36.0 - 46.0 %   MCV 97.9 78.0 - 100.0 fL    MCH 31.5 26.0 - 34.0 pg   MCHC 32.1 30.0 - 36.0 g/dL   RDW 15.6 (H) 11.5 - 15.5 %   Platelets 344 150 - 400 K/uL    Comment: Performed at [facility], [place] [address] [place], [place] [address]  I-Stat beta hCG blood, ED     Status: None   Collection Time: 02/25/18 12:53 AM  Result Value Ref Range   I-stat hCG, quantitative <5.0 <5 mIU/mL   Comment 3            Comment:   GEST. AGE      CONC.  (mIU/mL)   <=1 WEEK        5 - 50     2 WEEKS       50 - 500     3 WEEKS       100 - 10,000     4 WEEKS     1,000 - 30,000        FEMALE AND NON-PREGNANT FEMALE:     LESS THAN 5 mIU/mL     Dg Hand Complete Right  Result Date: 02/25/2018 CLINICAL DATA:  Right hand pain. Laceration of middle finger a few days ago, with swelling. EXAM: RIGHT HAND - COMPLETE 3+ VIEW COMPARISON:  None. FINDINGS: There is no evidence of fracture or dislocation. Remote fracture of the fifth digit distal phalanx. There is no evidence of arthropathy or other focal bone abnormality. Laceration noted about the middle finger proximal interphalangeal joint ulnar aspect. Diffuse soft tissue edema of the digit. No tracking soft tissue air or radiopaque foreign body. IMPRESSION: Soft tissue edema of the middle finger with laceration about the proximal interphalangeal joint. No soft tissue air, radiopaque foreign body, or acute osseous abnormality. Electronically Signed   By: Jeb Levering M.D.   On: 02/25/2018 06:17    Review of Systems  All other systems reviewed and are negative.  Blood pressure 112/70, pulse 87, temperature 98.1 F (36.7 C), temperature source Oral, resp. rate 18, last menstrual period 02/01/2018, SpO2 99 %, unknown if currently breastfeeding. Physical Exam  Constitutional: She appears well-developed and well-nourished.  HENT:  Head: Normocephalic and atraumatic.  Neck: Normal range of motion.  Cardiovascular: Normal rate.  Respiratory: Effort normal.  Musculoskeletal:       Right hand: She  exhibits laceration and swelling.  Right long finger dorsal pip wound with drainage  No flexor sheath or joint involvement  Skin: Skin is warm. There is erythema.  Assessment/Plan: Will need admission to medicine for IV ABX till wbc normalizes  Will need I and D later today  Npo please  Schuyler Amor 02/25/2018, 7:55 AM
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
## 2018-02-25 NOTE — Op Note (Signed)
Please see dictated report [phone]

## 2018-02-25 NOTE — ED Triage Notes (Signed)
Pt reports she is having bad thoughts about hurting herself.  She states she "does not have anybody... Keeps blacking out and hurting people."  Pt is a very poor historian.  Has staples in her head and stitches that she states were placed three days ago.

## 2018-02-25 NOTE — Transfer of Care (Signed)
Immediate Anesthesia Transfer of Care Note  Patient: Denise Brandt  Procedure(s) Performed: IRRIGATION AND DEBRIDEMENT EXTREMITY (Right Hand)  Patient Location: PACU  Anesthesia Type:General  Level of Consciousness: awake and patient cooperative  Airway & Oxygen Therapy: Patient Spontanous Breathing  Post-op Assessment: Report given to RN and Post -op Vital signs reviewed and stable  Post vital signs: Reviewed and stable  Last Vitals:  Vitals Value Taken Time  BP 115/81 02/25/2018  5:43 PM  Temp    Pulse 79 02/25/2018  5:46 PM  Resp 15 02/25/2018  5:46 PM  SpO2 95 % 02/25/2018  5:46 PM  Vitals shown include unvalidated device data.  Last Pain:  Vitals:   02/25/18 1416  TempSrc: Oral  PainSc:          Complications: No apparent anesthesia complications

## 2018-02-25 NOTE — Op Note (Signed)
NAMETSERING, LEAMAN MEDICAL RECORD HQ:[id number] ACCOUNT [id number] DATE OF BIRTH:Mar 18, 1981 FACILITY: [facility] LOCATION: [facility] PHYSICIAN:Kwana Ringel A. Burney Gauze, MD  OPERATIVE REPORT  DATE OF PROCEDURE:  02/25/2018  PREOPERATIVE DIAGNOSIS:  Subcutaneous abscess, right long finger proximal interphalangeal joint area.  POSTOPERATIVE DIAGNOSIS:  Subcutaneous abscess, right long finger proximal interphalangeal joint area.  PROCEDURE:  Incision and drainage above.  SURGEON:  .[name], MD  ASSISTANT:  None.  ANESTHESIA:  General.  TOURNIQUET:  None.  COMPLICATIONS:  None.  DRAINS:  Wound packed open with 1/4-inch iodoform gauze.  CULTURES:  Sent.  DESCRIPTION OF PROCEDURE:  The patient was taken to the operating suite after induction of adequate general anesthetic.  Right upper extremity was prepped and draped in the usual sterile fashion.  At this point in time, a hemostat was used to spread  through a 1 x 1 cm draining wound on the dorsal aspect of the right long finger proximal interphalangeal joint.  Dissection was carried down under the skin and subcutaneous tissues proximally and distally.  Purulence was encountered and cultured.  We  thoroughly irrigated with 500 mL normal saline and then packed it open with 1/4-inch iodoform gauze, and 4 x 4's and Coban wrap were applied.  The patient tolerated this procedure well and went to recovery room in stable fashion.  LN/NUANCE  D:02/25/2018 T:02/25/2018 JOB:[id number]

## 2018-02-25 NOTE — Anesthesia Preprocedure Evaluation (Signed)
Anesthesia Evaluation  Patient identified by MRN, date of birth, ID band Patient awake    Reviewed: Allergy & Precautions, NPO status , Patient's Chart, lab work & pertinent test results  History of Anesthesia Complications Negative for: history of anesthetic complications  Airway Mallampati: III  TM Distance: >3 FB Neck ROM: Full    Dental  (+) Teeth Intact   Pulmonary Current Smoker,    breath sounds clear to auscultation       Cardiovascular  Rhythm:Regular     Neuro/Psych PSYCHIATRIC DISORDERS Anxiety Depression negative neurological ROS     GI/Hepatic negative GI ROS, (+)     substance abuse  alcohol use,   Endo/Other  negative endocrine ROS  Renal/GU negative Renal ROS     Musculoskeletal   Abdominal   Peds  Hematology  (+) anemia ,   Anesthesia Other Findings   Reproductive/Obstetrics                             Anesthesia Physical Anesthesia Plan  ASA: II  Anesthesia Plan: General   Post-op Pain Management:    Induction: Intravenous  PONV Risk Score and Plan: 2 and Ondansetron and Dexamethasone  Airway Management Planned: LMA  Additional Equipment: None  Intra-op Plan:   Post-operative Plan: Extubation in OR  Informed Consent: I have reviewed the patients History and Physical, chart, labs and discussed the procedure including the risks, benefits and alternatives for the proposed anesthesia with the patient or authorized representative who has indicated his/her understanding and acceptance.   Dental advisory given  Plan Discussed with: CRNA and Surgeon  Anesthesia Plan Comments:         Anesthesia Quick Evaluation
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
## 2018-02-25 NOTE — ED Notes (Signed)
Regular/no sharps ordered

## 2018-02-25 NOTE — Anesthesia Procedure Notes (Signed)
Procedure Name: LMA Insertion Date/Time: 02/25/2018 5:20 PM Performed by: Lance Coon, CRNA Pre-anesthesia Checklist: Patient identified, Emergency Drugs available, Suction available, Patient being monitored and Timeout performed Patient Re-evaluated:Patient Re-evaluated prior to induction Oxygen Delivery Method: Circle system utilized Preoxygenation: Pre-oxygenation with 100% oxygen Induction Type: IV induction LMA: LMA inserted LMA Size: 4.0 Number of attempts: 1 Tube secured with: Tape Dental Injury: Teeth and Oropharynx as per pre-operative assessment

## 2018-02-25 NOTE — Progress Notes (Signed)
Patient underwent incision and drainage and open packing of right long finger subcutaneous abscess this evening. Cultures were sent. Patient can be discharged when white count normalizes on oral antibiotics. Patient does not need opioid medications for soft tissue surgery and anti-inflammatories will be sufficient. We will need to see my office this Friday to pull the packing.

## 2018-02-25 NOTE — ED Notes (Signed)
Patient transported to X-ray 

## 2018-02-25 NOTE — ED Notes (Signed)
MD at bedside. Patient will be taken to [facility] after MD finishes.
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
## 2018-02-25 NOTE — Progress Notes (Addendum)
Pharmacy Antibiotic Note  Denise Brandt is a 37 y.o. female admitted on 02/25/2018 with cellulitis.  Pharmacy has been consulted for Vancomycin and Ceftazidime dosing.  CC/HPI: suicidal, right hand pain PMH: depression, PTSD, tobacco/cocaine/EtOH use, homeless  D#1 for right long finger cellulitis, I&D today WBC 12, afebrile  Vanc 7/11 >> Ceftaz 7/11>> Unasyn x1 7/11  Wound cx 7/11>>pending  Plan: Vancomycin 1000 mg IV every 12 hours.  Goal trough 10-15 mcg/mL.  Ceftazidime 1 g IV every 8 hours.     Temp (24hrs), Avg:98.3 F (36.8 C), Min:98.1 F (36.7 C), Max:98.5 F (36.9 C)  Recent Labs  Lab 02/19/18 0902 02/25/18 0041  WBC 11.4* 12.0*  CREATININE 1.01* 0.90    Estimated Creatinine Clearance: 87.3 mL/min (by C-G formula based on SCr of 0.9 mg/dL).    No Known Allergies   Jackson Latino, PharmD PGY1 Pharmacy Resident Phone [phone] 02/25/2018     9:39 AM  I discussed / reviewed the pharmacy note by Dr. Tamera Reason and I agree with the resident's findings and plans as documented.  Undra Trembath S. Alford Highland, PharmD, [facility] Clinical Staff Pharmacist Pager [phone]

## 2018-02-26 ENCOUNTER — Encounter (HOSPITAL_COMMUNITY): Payer: Self-pay | Admitting: Orthopedic Surgery

## 2018-02-26 DIAGNOSIS — F332 Major depressive disorder, recurrent severe without psychotic features: Secondary | ICD-10-CM

## 2018-02-26 DIAGNOSIS — F4310 Post-traumatic stress disorder, unspecified: Secondary | ICD-10-CM

## 2018-02-26 DIAGNOSIS — F17210 Nicotine dependence, cigarettes, uncomplicated: Secondary | ICD-10-CM

## 2018-02-26 DIAGNOSIS — F1490 Cocaine use, unspecified, uncomplicated: Secondary | ICD-10-CM

## 2018-02-26 DIAGNOSIS — Z7289 Other problems related to lifestyle: Secondary | ICD-10-CM

## 2018-02-26 DIAGNOSIS — E876 Hypokalemia: Secondary | ICD-10-CM

## 2018-02-26 DIAGNOSIS — L03011 Cellulitis of right finger: Principal | ICD-10-CM

## 2018-02-26 DIAGNOSIS — R45851 Suicidal ideations: Secondary | ICD-10-CM

## 2018-02-26 DIAGNOSIS — F419 Anxiety disorder, unspecified: Secondary | ICD-10-CM

## 2018-02-26 DIAGNOSIS — Z590 Homelessness: Secondary | ICD-10-CM

## 2018-02-26 DIAGNOSIS — L0390 Cellulitis, unspecified: Secondary | ICD-10-CM

## 2018-02-26 DIAGNOSIS — F333 Major depressive disorder, recurrent, severe with psychotic symptoms: Secondary | ICD-10-CM

## 2018-02-26 DIAGNOSIS — F1290 Cannabis use, unspecified, uncomplicated: Secondary | ICD-10-CM

## 2018-02-26 DIAGNOSIS — L02511 Cutaneous abscess of right hand: Secondary | ICD-10-CM

## 2018-02-26 DIAGNOSIS — F1099 Alcohol use, unspecified with unspecified alcohol-induced disorder: Secondary | ICD-10-CM

## 2018-02-26 DIAGNOSIS — D539 Nutritional anemia, unspecified: Secondary | ICD-10-CM

## 2018-02-26 DIAGNOSIS — L818 Other specified disorders of pigmentation: Secondary | ICD-10-CM

## 2018-02-26 LAB — BASIC METABOLIC PANEL
Anion gap: 11 (ref 5–15)
BUN: 5 mg/dL — ABNORMAL LOW (ref 6–20)
CO2: 26 mmol/L (ref 22–32)
Calcium: 7.8 mg/dL — ABNORMAL LOW (ref 8.9–10.3)
Chloride: 100 mmol/L (ref 98–111)
Creatinine, Ser: 0.97 mg/dL (ref 0.44–1.00)
GFR calc Af Amer: >60 mL/min (ref >60)
GFR calc non Af Amer: >60 mL/min (ref >60)
Glucose, Bld: 104 mg/dL — ABNORMAL HIGH (ref 70–99)
Potassium: 3.9 mmol/L (ref 3.5–5.1)
Sodium: 137 mmol/L (ref 135–145)

## 2018-02-26 LAB — CBC
HCT: 27.3 % — ABNORMAL LOW (ref 36.0–46.0)
Hemoglobin: 8.5 g/dL — ABNORMAL LOW (ref 12.0–15.0)
MCH: 31.4 pg (ref 26.0–34.0)
MCHC: 31.1 g/dL (ref 30.0–36.0)
MCV: 100.7 fL — ABNORMAL HIGH (ref 78.0–100.0)
Platelets: 305 10*3/uL (ref 150–400)
RBC: 2.71 MIL/uL — ABNORMAL LOW (ref 3.87–5.11)
RDW: 16.1 % — ABNORMAL HIGH (ref 11.5–15.5)
WBC: 8.1 10*3/uL (ref 4.0–10.5)

## 2018-02-26 LAB — PHOSPHORUS: PHOSPHORUS: 3.3 mg/dL (ref 2.5–4.6)

## 2018-02-26 LAB — IRON AND TIBC
IRON: 38 ug/dL (ref 28–170)
SATURATION RATIOS: 11 % (ref 10.4–31.8)
TIBC: 346 ug/dL (ref 250–450)
UIBC: 308 ug/dL

## 2018-02-26 LAB — VITAMIN B12: Vitamin B-12: 521 pg/mL (ref 180–914)

## 2018-02-26 LAB — MAGNESIUM: Magnesium: 1.8 mg/dL (ref 1.7–2.4)

## 2018-02-26 MED ORDER — ACETAMINOPHEN 325 MG PO TABS
650.0000 mg | ORAL_TABLET | Freq: Four times a day (QID) | ORAL | Status: DC | PRN
  Administered 2018-02-27: 650 mg via ORAL
  Filled 2018-02-26: qty 2

## 2018-02-26 MED ORDER — SULFAMETHOXAZOLE-TRIMETHOPRIM 800-160 MG PO TABS
2.0000 | ORAL_TABLET | Freq: Two times a day (BID) | ORAL | Status: DC
Start: 2018-02-26 — End: 2018-02-27
  Administered 2018-02-26 – 2018-02-27 (×2): 2 via ORAL
  Filled 2018-02-26 (×2): qty 2

## 2018-02-26 MED ORDER — RAMELTEON 8 MG PO TABS
8.0000 mg | ORAL_TABLET | Freq: Once | ORAL | Status: AC | PRN
  Administered 2018-02-26: 8 mg via ORAL
  Filled 2018-02-26: qty 1

## 2018-02-26 MED ORDER — VITAMIN B-1 100 MG PO TABS
100.0000 mg | ORAL_TABLET | Freq: Every day | ORAL | Status: DC
  Administered 2018-02-26 – 2018-02-27 (×2): 100 mg via ORAL
  Filled 2018-02-26 (×2): qty 1

## 2018-02-26 MED ORDER — THIAMINE HCL 100 MG/ML IJ SOLN: 100.0000 mg | Freq: Every day | INTRAMUSCULAR | Status: DC

## 2018-02-26 MED ORDER — IBUPROFEN 200 MG PO TABS
600.0000 mg | ORAL_TABLET | Freq: Four times a day (QID) | ORAL | Status: DC | PRN
  Administered 2018-02-26: 600 mg via ORAL
  Filled 2018-02-26: qty 3

## 2018-02-26 MED ORDER — ADULT MULTIVITAMIN W/MINERALS CH
1.0000 | ORAL_TABLET | Freq: Every day | ORAL | Status: DC
  Administered 2018-02-26 – 2018-02-27 (×2): 1 via ORAL
  Filled 2018-02-26 (×2): qty 1

## 2018-02-26 MED ORDER — IBUPROFEN 200 MG PO TABS
800.0000 mg | ORAL_TABLET | Freq: Four times a day (QID) | ORAL | Status: DC | PRN
  Administered 2018-02-26 – 2018-02-27 (×5): 800 mg via ORAL
  Filled 2018-02-26 (×5): qty 4

## 2018-02-26 MED ORDER — FOLIC ACID 1 MG PO TABS
1.0000 mg | ORAL_TABLET | Freq: Every day | ORAL | Status: DC
  Administered 2018-02-26 – 2018-02-27 (×2): 1 mg via ORAL
  Filled 2018-02-26 (×2): qty 1
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
## 2018-02-26 NOTE — Anesthesia Postprocedure Evaluation (Signed)
Anesthesia Post Note  Patient: Denise Brandt  Procedure(s) Performed: IRRIGATION AND DEBRIDEMENT EXTREMITY (Right Hand)     Patient location during evaluation: PACU Anesthesia Type: General Level of consciousness: awake and alert Pain management: pain level controlled Vital Signs Assessment: post-procedure vital signs reviewed and stable Respiratory status: spontaneous breathing, nonlabored ventilation, respiratory function stable and patient connected to nasal cannula oxygen Cardiovascular status: blood pressure returned to baseline and stable Postop Assessment: no apparent nausea or vomiting Anesthetic complications: no    Last Vitals:  Vitals:   02/25/18 2232 02/26/18 0618  BP: 98/63 94/77  Pulse: 79 79  Resp: 14 14  Temp: 37 C 37.2 C  SpO2: 100% 100%    Last Pain:  Vitals:   02/26/18 0802  TempSrc:   PainSc: 3                  Elita Dame
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
## 2018-02-27 ENCOUNTER — Inpatient Hospital Stay (HOSPITAL_COMMUNITY)
Admission: AD | Admit: 2018-02-27 | Discharge: 2018-03-04 | DRG: 885 | Disposition: A | Discharge status: Home / Self Care | Payer: Federal, State, Local not specified - Other | Source: Intra-hospital | Attending: Psychiatry | Admitting: Psychiatry

## 2018-02-27 DIAGNOSIS — S61209A Unspecified open wound of unspecified finger without damage to nail, initial encounter: Secondary | ICD-10-CM | POA: Diagnosis present

## 2018-02-27 DIAGNOSIS — F4310 Post-traumatic stress disorder, unspecified: Secondary | ICD-10-CM | POA: Diagnosis present

## 2018-02-27 DIAGNOSIS — F22 Delusional disorders: Secondary | ICD-10-CM | POA: Diagnosis present

## 2018-02-27 DIAGNOSIS — F332 Major depressive disorder, recurrent severe without psychotic features: Principal | ICD-10-CM | POA: Diagnosis present

## 2018-02-27 DIAGNOSIS — F1490 Cocaine use, unspecified, uncomplicated: Secondary | ICD-10-CM | POA: Diagnosis present

## 2018-02-27 DIAGNOSIS — F419 Anxiety disorder, unspecified: Secondary | ICD-10-CM | POA: Diagnosis present

## 2018-02-27 DIAGNOSIS — B9561 Methicillin susceptible Staphylococcus aureus infection as the cause of diseases classified elsewhere: Secondary | ICD-10-CM

## 2018-02-27 DIAGNOSIS — B954 Other streptococcus as the cause of diseases classified elsewhere: Secondary | ICD-10-CM

## 2018-02-27 DIAGNOSIS — F17210 Nicotine dependence, cigarettes, uncomplicated: Secondary | ICD-10-CM | POA: Diagnosis present

## 2018-02-27 DIAGNOSIS — Z590 Homelessness: Secondary | ICD-10-CM

## 2018-02-27 DIAGNOSIS — L03011 Cellulitis of right finger: Secondary | ICD-10-CM | POA: Diagnosis present

## 2018-02-27 DIAGNOSIS — R45851 Suicidal ideations: Secondary | ICD-10-CM | POA: Diagnosis present

## 2018-02-27 DIAGNOSIS — Z915 Personal history of self-harm: Secondary | ICD-10-CM

## 2018-02-27 LAB — COMPREHENSIVE METABOLIC PANEL
ALT: 16 U/L (ref 0–44)
ANION GAP: 6 (ref 5–15)
AST: 18 U/L (ref 15–41)
Albumin: 2.6 g/dL — ABNORMAL LOW (ref 3.5–5.0)
Alkaline Phosphatase: 72 U/L (ref 38–126)
BUN: 5 mg/dL — ABNORMAL LOW (ref 6–20)
CALCIUM: 8.5 mg/dL — AB (ref 8.9–10.3)
CO2: 26 mmol/L (ref 22–32)
Chloride: 107 mmol/L (ref 98–111)
Creatinine, Ser: 0.92 mg/dL (ref 0.44–1.00)
GFR calc Af Amer: >60 mL/min (ref >60)
GFR calc non Af Amer: >60 mL/min (ref >60)
GLUCOSE: 87 mg/dL (ref 70–99)
Potassium: 3.9 mmol/L (ref 3.5–5.1)
SODIUM: 139 mmol/L (ref 135–145)
Total Bilirubin: 0.6 mg/dL (ref 0.3–1.2)
Total Protein: 5.5 g/dL — ABNORMAL LOW (ref 6.5–8.1)

## 2018-02-27 LAB — CBC
HCT: 26.6 % — ABNORMAL LOW (ref 36.0–46.0)
Hemoglobin: 8.2 g/dL — ABNORMAL LOW (ref 12.0–15.0)
MCH: 30.9 pg (ref 26.0–34.0)
MCHC: 30.8 g/dL (ref 30.0–36.0)
MCV: 100.4 fL — ABNORMAL HIGH (ref 78.0–100.0)
PLATELETS: 287 10*3/uL (ref 150–400)
RBC: 2.65 MIL/uL — ABNORMAL LOW (ref 3.87–5.11)
RDW: 16.2 % — AB (ref 11.5–15.5)
WBC: 7 10*3/uL (ref 4.0–10.5)

## 2018-02-27 MED ORDER — HYDROXYZINE HCL 25 MG PO TABS
50.0000 mg | ORAL_TABLET | Freq: Once | ORAL | Status: AC
  Administered 2018-02-27: 50 mg via ORAL
  Filled 2018-02-27: qty 2

## 2018-02-27 MED ORDER — IBUPROFEN 800 MG PO TABS: 800.0000 mg | ORAL_TABLET | Freq: Four times a day (QID) | ORAL | 0 refills | Status: DC | PRN

## 2018-02-27 MED ORDER — HYDROXYZINE HCL 25 MG PO TABS: 25.0000 mg | ORAL_TABLET | Freq: Three times a day (TID) | ORAL | Status: DC | PRN

## 2018-02-27 MED ORDER — HYDROXYZINE HCL 25 MG PO TABS
50.0000 mg | ORAL_TABLET | Freq: Once | ORAL | Status: AC | PRN
  Administered 2018-02-27: 50 mg via ORAL
  Filled 2018-02-27: qty 2

## 2018-02-27 MED ORDER — TRAZODONE HCL 100 MG PO TABS
100.0000 mg | ORAL_TABLET | Freq: Every evening | ORAL | Status: DC | PRN
  Administered 2018-02-28 – 2018-03-03 (×10): 100 mg via ORAL
  Filled 2018-02-27: qty 1
  Filled 2018-02-27: qty 14
  Filled 2018-02-27 (×4): qty 1
  Filled 2018-02-27: qty 14
  Filled 2018-02-27: qty 1
  Filled 2018-02-27: qty 14
  Filled 2018-02-27 (×9): qty 1

## 2018-02-27 MED ORDER — LORAZEPAM 1 MG PO TABS: 1.0000 mg | ORAL_TABLET | ORAL | Status: DC | PRN

## 2018-02-27 MED ORDER — AMOXICILLIN-POT CLAVULANATE 875-125 MG PO TABS: 1.0000 | ORAL_TABLET | Freq: Two times a day (BID) | ORAL | Status: DC

## 2018-02-27 MED ORDER — CLINDAMYCIN HCL 300 MG PO CAPS: 300.0000 mg | ORAL_CAPSULE | Freq: Four times a day (QID) | ORAL | 0 refills | Status: DC

## 2018-02-27 MED ORDER — LORAZEPAM 2 MG/ML IJ SOLN
1.0000 mg | INTRAMUSCULAR | Status: DC | PRN
  Administered 2018-02-27: 1 mg via INTRAMUSCULAR
  Filled 2018-02-27: qty 1

## 2018-02-27 MED ORDER — CLINDAMYCIN HCL 300 MG PO CAPS
300.0000 mg | ORAL_CAPSULE | Freq: Four times a day (QID) | ORAL | Status: DC
  Administered 2018-02-27 (×3): 300 mg via ORAL
  Filled 2018-02-27 (×3): qty 1

## 2018-02-27 MED ORDER — HYDROXYZINE HCL 25 MG PO TABS
25.0000 mg | ORAL_TABLET | Freq: Four times a day (QID) | ORAL | Status: DC | PRN
  Administered 2018-02-28 – 2018-03-04 (×12): 25 mg via ORAL
  Filled 2018-02-27 (×12): qty 1

## 2018-02-27 NOTE — Discharge Summary (Signed)
Name: Denise Brandt MRN: [id number] DOB: 08-09-1981 37 y.o. PCP: Patient, No Pcp Per  Date of Admission: 02/25/2018 12:17 AM Date of Discharge: 02/27/2018 Attending Physician: Bartholomew Crews, MD  Discharge Diagnosis: 1. Purulent celllulitis 2. SI 3. Macrocytic anemia  Discharge Medications: Allergies as of 02/27/2018   No Known Allergies     Medication List    STOP taking these medications   cephALEXin 500 MG capsule Commonly known as:  KEFLEX   HYDROcodone-acetaminophen 5-325 MG tablet Commonly known as:  NORCO/VICODIN   naproxen 500 MG tablet Commonly known as:  NAPROSYN   penicillin v potassium 500 MG tablet Commonly known as:  VEETID   phenazopyridine 200 MG tablet Commonly known as:  PYRIDIUM     TAKE these medications   clindamycin 300 MG capsule Commonly known as:  CLEOCIN Take 1 capsule (300 mg total) by mouth every 6 (six) hours for 4 days.   ibuprofen 800 MG tablet Commonly known as:  ADVIL,MOTRIN Take 1 tablet (800 mg total) by mouth every 6 (six) hours as needed for moderate pain.       Disposition and follow-up:   Denise Brandt was discharged from [facility] in Stable condition.  At the hospital follow up visit please address:  1.   Purulent right 3rd digit cellulitis: S/p I&D 7/11. Continue clindamycin course to complete 7d (end date 7/17).   SI: Transferred to [facility] for further management.  Macrocytic anemia: Iron studies do not show iron deficiency anemia, B12 wnl. No s/sx of bleeding. Please repeat CBC in 2-3 weeks for stability.   2.  Labs / imaging needed at time of follow-up: cbc in 2-3 weeks for anemia  3.  Pending labs/ test needing follow-up: blood culture 02/25/2018, wound culture with sensitivities 02/25/18 x2  Follow-up Appointments: Follow-up Information    [name], MD On 03/01/2018.   Specialty:  Orthopedic Surgery Why:  For wound re-check Contact information: [name] [address] [phone]        Follow up On 02/26/2018.   Why:  For wound re-check          Hospital Course by problem list:  Purulent cellulitis: Patient presented to [facility] on 7/11 with continued pain in her right 3rd digit after initial laceration on 7/5. Exam revealed abscess on right 3rd digit overlying the PIP joint. She was afebrile, without leukocytosis. She was started on broad spectrum antibiotics with ceftaz and vancomycin and taken for I&D with hand surgery (Dr. Burney Gauze). Wound cultures from ED and I&D are growing strep pyogenes and staph aureus with sensitivities pending. She received 2 days of IV antibiotics and was switched to clindamycin on day 3 to complete 7 day course of antibiotics that would cover strep and MRSA if it is present. Blood cultures from initial admission date were negative to date. Source control should have been obtained based on OP note; please follow up sensitivities and final culture growths and adjust PO antibiotics as indicated if infection seems persistent. She needs to follow up with Dr. Bertis Ruddy office on 7/15 for wound check and packing removal.   SI: Patient with long history of MDD and SI currently not on medical treatment for a few months; on admission she endorsed suicidal ideation and attempted overdose night prior to admission. Psychiatry was consulted and recommended inpatient behavioral health placement for further management.   Macrocytic anemia: Patient with asymptomatic anemia with no signs or symptoms of bleeding found to have a slowly  downtrending Hgb during this admission which stabilized at around 8.5. Anemia work up revealed low-normal iron, normal TIBC, normal B12. Etiology is likely anemia of chronic disease and some component of her recent I&D. She is hemodynamically stable and w/o signs of bleeding. She should get a repeat CBC in 2-3 weeks to assess her Hgb.   Discharge Vitals:   BP 101/68 (BP Location: Left  Arm)   Pulse 69   Temp 98.4 F (36.9 C) (Oral)   Resp 14   Ht 5\' 4"  (1.626 m)   Wt 175 lb (79.4 kg)   LMP 02/01/2018 (Approximate)   SpO2 100%   BMI 30.04 kg/m   Pertinent Labs, Studies, and Procedures:  CBC Latest Ref Rng & Units 02/27/2018 02/26/2018 02/25/2018  WBC 4.0 - 10.5 K/uL 7.0 8.1 12.0(H)  Hemoglobin 12.0 - 15.0 g/dL 8.2(L) 8.5(L) 9.0(L)  Hematocrit 36.0 - 46.0 % 26.6(L) 27.3(L) 28.0(L)  Platelets 150 - 400 K/uL 287 305 344   BMET    Component Value Date/Time   NA 139 02/27/2018 0615   K 3.9 02/27/2018 0615   CL 107 02/27/2018 0615   CO2 26 02/27/2018 0615   GLUCOSE 87 02/27/2018 0615   BUN 5 (L) 02/27/2018 0615   CREATININE 0.92 02/27/2018 0615   CALCIUM 8.5 (L) 02/27/2018 0615   GFRNONAA >60 02/27/2018 0615   GFRAA >60 02/27/2018 0615   Iron/TIBC/Ferritin/ %Sat    Component Value Date/Time   IRON 38 02/26/2018 0829   TIBC 346 02/26/2018 0829   FERRITIN 42 02/25/2018 0939   IRONPCTSAT 11 02/26/2018 0829   Vitamin B12: 521 (ref 180-914)  Blood culture 02/25/18: NGTD x2 days  Wound/superficial culture 02/25/18: Few Group A Strep (S pyogenes) Few Staph aureus  Aerobic/anerobic culture, surgical 02/25/2018: Mod Group A Strep (S pyogenes) Few Staph aureus  Discharge Instructions: Discharge Instructions    Call MD for:  difficulty breathing, headache or visual disturbances   Complete by:  As directed    Call MD for:  extreme fatigue   Complete by:  As directed    Call MD for:  hives   Complete by:  As directed    Call MD for:  persistant dizziness or light-headedness   Complete by:  As directed    Call MD for:  persistant nausea and vomiting   Complete by:  As directed    Call MD for:  redness, tenderness, or signs of infection (pain, swelling, redness, odor or green/yellow discharge around incision site)   Complete by:  As directed    Call MD for:  severe uncontrolled pain   Complete by:  As directed    Call MD for:  temperature >100.4   Complete  by:  As directed    Diet - low sodium heart healthy   Complete by:  As directed    Discharge instructions   Complete by:  As directed    Please continue clindamycin 300mg  every 6 hours until your finish the course (on July 17th). Please follow up with Dr. Bertis Ruddy office on Monday 7/15 for a wound check.  Please follow up with [facility] in about 2 to 3 weeks for repeat blood work.   Increase activity slowly   Complete by:  As directed       Signed: Alphonzo Grieve, MD 02/27/2018, 3:49 PM

## 2018-02-27 NOTE — Progress Notes (Signed)
Spoke to pt with MD. She was angry and upset with IVC. Agreed after MD left to take the ativan. Now she is asking for medication for anxiety. We are waiting for the police to transport her to [facility]. She is resting in the bed and is less hostile.
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
## 2018-02-27 NOTE — Progress Notes (Signed)
Pt. Refused department mobile phone for communication saying it was too late, Pt is now complaining of not able to sleep and in need of antianxiety medicines and sleeping pills, she threatened to leave AMA if she does not get  those medicine, Attending doctor notified,will continue to monitor.

## 2018-02-27 NOTE — Progress Notes (Addendum)
   Subjective:  Patient verbalized being upset at still being in the medical hospital and wanting transfer to [facility]. She denies chest pain, shortness of breath, nausea, vomiting. She denies signs of bleeding including melena, hematochezia, hematuria, heavy menstrual bleeding.   Objective:  Vital signs in last 24 hours: Vitals:   02/25/18 2232 02/26/18 0618 02/26/18 1446 02/26/18 1942  BP: 98/63 94/77 (!) 91/58 104/66  Pulse: 79 79 66 67  Resp: 14 14 16 12   Temp: 98.6 F (37 C) 98.9 F (37.2 C)  98.4 F (36.9 C)  TempSrc: Oral Oral  Oral  SpO2: 100% 100% 99% 100%  Weight:      Height:       Constitutional: NAD CV: RRR Resp: CTAB, no increased work of breathing Ext: right 3rd digit bandaged, no apparent cellulitis outside of bandaged area  Assessment/Plan:  Principal Problem:   Suicidal ideation Active Problems:   Alcohol use disorder, moderate, dependence (HCC)   Cellulitis   Anemia   Hypokalemia  Purulent cellulitis: S/p I&D 7/11 of right index finger. Would cultures from ED and I&D are growing strep pyogenes and staph aureus; sensitivities will be back tomorrow. Will cover for strep and possibility of mrsa with clindamycin to complete 7d course (on day 3 currently). BCx are negative to date.  --switch to PO clindamycin 300mg  QID, end date 7/17. --to f/u with hand surgery Monday 7/15 for wound check and unpacking  SI: Psychiatry recommends inpatient [facility] admission. SW on board and in contact with [place], possible bed availability this afternoon. She is medically stable when bed is available.  Macrocytic anemia: Patient asymptomatic; Hg 8.2 this morning w/o s/sx of bleeding. Iron studies normal. Will have patient f/u with PCP after discharge from [facility].  Dispo: Medically cleared to discharge to [facility], awaiting bed.   Alphonzo Grieve, MD 02/27/2018, 11:47 AM Pager [phone]

## 2018-02-27 NOTE — Progress Notes (Signed)
  Date: 02/27/2018  Patient name: Denise Brandt  Medical record number: [id number]  Date of birth: 1981/06/09   I have seen and evaluated this patient and I have discussed the plan of care with the house staff. Please see their note for complete details. I concur with their findings with the following additions/corrections: [name] is concerned that she is in the hospital rather than mental health.  Psychiatry did evaluate her last night and does feel that she is appropriate for inpatient psychiatric care after she is medically stable.  In regards to her right third finger, it remains bandaged per surgery.  She is afebrile without leukocytosis.  Blood cultures are negative x1 day.  Initial wound culture on the 11th shows strep biology knees with sensitivities pending.  OR culture pending.  We can switch to oral antibiotics and complete her course orally.  She does have anemia with iron studies indicating anemia of chronic disease rather than iron deficiency.  She denies bright red blood per rectum, melena, hematuria, or heavy vaginal bleeding.  Her hemoglobin is down from admission.  This is likely multifactorial including recent surgery.  Her hemoglobin today is basically stable from yesterday and this can be followed up as an outpatient.  She is medically stable to be transferred to inpatient psychiatry.  Bartholomew Crews, MD 02/27/2018, 11:48 AM

## 2018-02-27 NOTE — Clinical Social Work Note (Signed)
CSW has made [facility] referral to [facility]--awaiting response.  Loletha Grayer, MSW [phone]

## 2018-02-27 NOTE — Progress Notes (Signed)
Report called to [facility]. Patient is much calmer and agreeable to going to [facility]. [facility] police here to transport patient. All paperwork given to police. Pts belongings sent with her. Pain medication and atarax given prior to transport.

## 2018-02-27 NOTE — Clinical Social Work Note (Signed)
CSW spoke with pt at bedside. Pt has been to [facility] in the past. At this time pt is voluntary. Pt signed voluntary form and form was faxed to [facility]. [facility] suspects she will have a bed for pt this afternoon. Pt updated and agreeable.   Loletha Grayer, MSW [phone]

## 2018-02-27 NOTE — Progress Notes (Signed)
Pt is currently in room with sitter. We are waiting for MD to complete paper work for [facility] and for police to come to take her to behavioral health. Ativan ordered but patient has been refusing her medications and we will wait to give it to her at time of transport.

## 2018-02-27 NOTE — Care Management (Signed)
Resources for clinics listed on AVS.  Pt should call for appointment.

## 2018-02-27 NOTE — Progress Notes (Signed)
Upon report, pt stated she did not want to wait for transportation, that she wanted to leave. RN stated that she needed to stay because transportation and room was already arranged. Pt stated, "it is too late in the night, I do not want to wait, I will leave on my own, I will come back Monday" RN restated, that the room is ready today, and will not be available at a later date. Patient's response did not change. Patient stated she is bipolar, and wants to go..."give me my clothes"  Internal medicine doctors were notified of situation. Internal medicine doctor stated they will order IVC for patient. Doctors were notified by RN that they needed to come speak with patient about care changes, and needed to go to ED to fill out IVC paperwork.  RN notified [facility] of situation.  RN notified security of situation. Security refuses any help until IVC paperwork is in hand.  RN notified [facility] transportation, chris at [employer] stated that they only transport voluntary commitment patients, not IVC therefore, transportation was canceled.

## 2018-02-27 NOTE — Clinical Social Work Note (Signed)
[facility] has accepted pt. Accepting MD is Dr. Sindy Messing. Pt will go to room 302-2. RN to call report to [phone]. Pick up time for [facility] is 9:00 PM.  Clinical Social Worker will sign off for now as social work intervention is no longer needed. Please consult [place] again if new need arises.   Shelton Silvas A Nandini Bogdanski 02/27/2018

## 2018-02-28 ENCOUNTER — Encounter (HOSPITAL_COMMUNITY): Payer: Self-pay | Admitting: *Deleted

## 2018-02-28 ENCOUNTER — Other Ambulatory Visit: Payer: Self-pay

## 2018-02-28 DIAGNOSIS — L0390 Cellulitis, unspecified: Secondary | ICD-10-CM | POA: Diagnosis not present

## 2018-02-28 DIAGNOSIS — F332 Major depressive disorder, recurrent severe without psychotic features: Secondary | ICD-10-CM | POA: Diagnosis not present

## 2018-02-28 DIAGNOSIS — R45851 Suicidal ideations: Secondary | ICD-10-CM | POA: Diagnosis not present

## 2018-02-28 LAB — AEROBIC CULTURE  (SUPERFICIAL SPECIMEN)

## 2018-02-28 LAB — AEROBIC CULTURE W GRAM STAIN (SUPERFICIAL SPECIMEN): Special Requests: NORMAL

## 2018-02-28 MED ORDER — CLINDAMYCIN HCL 300 MG PO CAPS
300.0000 mg | ORAL_CAPSULE | Freq: Four times a day (QID) | ORAL | Status: DC
  Administered 2018-02-28 – 2018-03-01 (×6): 300 mg via ORAL
  Filled 2018-02-28 (×6): qty 1
  Filled 2018-02-28: qty 2
  Filled 2018-02-28 (×6): qty 1

## 2018-02-28 MED ORDER — RISPERIDONE 1 MG PO TABS
1.0000 mg | ORAL_TABLET | Freq: Every day | ORAL | Status: DC
  Administered 2018-02-28 – 2018-03-01 (×2): 1 mg via ORAL
  Filled 2018-02-28 (×4): qty 1

## 2018-02-28 MED ORDER — DICLOFENAC SODIUM 75 MG PO TBEC
75.0000 mg | DELAYED_RELEASE_TABLET | Freq: Two times a day (BID) | ORAL | Status: DC | PRN
  Administered 2018-02-28: 75 mg via ORAL
  Filled 2018-02-28: qty 1

## 2018-02-28 MED ORDER — ACETAMINOPHEN 325 MG PO TABS
650.0000 mg | ORAL_TABLET | Freq: Four times a day (QID) | ORAL | Status: DC | PRN
Start: 2018-02-28 — End: 2018-03-04
  Administered 2018-02-28 – 2018-03-03 (×4): 650 mg via ORAL
  Filled 2018-02-28 (×4): qty 2

## 2018-02-28 MED ORDER — IBUPROFEN 400 MG PO TABS
400.0000 mg | ORAL_TABLET | Freq: Four times a day (QID) | ORAL | Status: DC | PRN
  Administered 2018-02-28 – 2018-03-03 (×6): 400 mg via ORAL
  Filled 2018-02-28 (×6): qty 1

## 2018-02-28 MED ORDER — QUETIAPINE FUMARATE 50 MG PO TABS: 50.0000 mg | ORAL_TABLET | Freq: Every day | ORAL | Status: DC

## 2018-02-28 MED ORDER — CITALOPRAM HYDROBROMIDE 10 MG PO TABS
10.0000 mg | ORAL_TABLET | Freq: Every day | ORAL | Status: DC
  Administered 2018-02-28 – 2018-03-04 (×5): 10 mg via ORAL
  Filled 2018-02-28 (×7): qty 1
  Filled 2018-02-28: qty 7
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
## 2018-02-28 NOTE — Progress Notes (Addendum)
Patient ID: Denise Brandt, female   DOB: Dec 21, 1980, 37 y.o.   MRN: [id number]  Nursing Progress Note [id number]  Data: Patient presents with depressed mood and irritability. Patient complaint with scheduled medications. Patient has been isolative to her room and does not attend groups. Patient complaining of discomfort to her finger. Patient provided but declined to complete their self-inventory sheet. Patient currently denies SI/HI/AVH.   Action: Patient educated about and provided medication per provider's orders. Patient safety maintained with q15 min safety checks and frequent rounding. Low fall risk precautions in place. Emotional support given. 1:1 interaction and active listening provided. Patient encouraged to attend meals and groups. Patient encouraged to work on treatment plan and goals. Labs, vital signs and patient behavior monitored throughout shift. EKG performed.  Response: Patient agrees to come to staff if any thoughts of SI/HI develop or if patient develops intention of acting on thoughts. Patient remains safe on the unit at this time. Patient is interacting with peers appropriately on the unit. Will continue to support and monitor.
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
## 2018-02-28 NOTE — Tx Team (Signed)
Initial Treatment Plan 02/28/2018 1:09 AM [name] [id number]    PATIENT STRESSORS: Financial difficulties Marital or family conflict Medication change or noncompliance Occupational concerns Substance abuse Traumatic event   PATIENT STRENGTHS: Average or above average intelligence Communication skills   PATIENT IDENTIFIED PROBLEMS: Depression  Suicidal Ideation  Substance abuse  "I need to be able to afford my medication and get back on it"  "I need to be able to get consistent help"             DISCHARGE CRITERIA:  Adequate post-discharge living arrangements Improved stabilization in mood, thinking, and/or behavior Medical problems require only outpatient monitoring Motivation to continue treatment in a less acute level of care Need for constant or close observation no longer present Verbal commitment to aftercare and medication compliance Withdrawal symptoms are absent or subacute and managed without 24-hour nursing intervention  PRELIMINARY DISCHARGE PLAN: Attend 12-step recovery group Outpatient therapy Placement in alternative living arrangements  PATIENT/FAMILY INVOLVEMENT: This treatment plan has been presented to and reviewed with the patient, Denise Brandt.  The patient and family have been given the opportunity to ask questions and make suggestions.  Margaretann Loveless, RN 02/28/2018, 1:09 AM

## 2018-02-28 NOTE — Progress Notes (Signed)
D.  Pt pleasant on approach, no complaints voiced.  Pt did get up and eat, as she had not felt well all day to do so before.  Pt did not feel well enough to attend evening AA group.  Pt denies SI/HI/AVH at this time.  Minimal but appropriate interaction with peers on unit.  A.  Support and encouragement offered, medication given as ordered  R.  Pt remains safe on the unit, will continue to monitor.

## 2018-02-28 NOTE — Progress Notes (Addendum)
Patient ID: Denise Brandt, female   DOB: October 25, 1980, 37 y.o.   MRN: [id number]  Patient currently refusing for staff to obtain vitals. Will reattempt later this morning.
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
## 2018-02-28 NOTE — Plan of Care (Signed)
Problem: Safety: Goal: Periods of time without injury will increase Intervention: Patient contracts for safety on the unit. Low fall risk precautions in place. Safety monitored with q15 minute checks. Outcome: Patient remains safe on the unit at this time. 02/28/2018 4:39 PM - Progressing by Annia Friendly, RN

## 2018-02-28 NOTE — [facility] Group Notes (Signed)
[facility] LCSW Group Therapy Note  Date/Time:  02/28/2018 9:00-10:00 or 10:00-11:00AM  Type of Therapy and Topic:  Group Therapy:  Healthy and Unhealthy Supports  Participation Level:  Did Not Attend   Description of Group:  Patients in this group were introduced to the idea of adding a variety of healthy supports to address the various needs in their lives.Patients discussed what additional healthy supports could be helpful in their recovery and wellness after discharge in order to prevent future hospitalizations.   An emphasis was placed on using counselor, doctor, therapy groups, 12-step groups, and problem-specific support groups to expand supports.  They also worked as a group on developing a specific plan for several patients to deal with unhealthy supports through [place], psychoeducation with loved ones, and even termination of relationships.   Therapeutic Goals:   1)  discuss importance of adding supports to stay well once out of the hospital  2)  compare healthy versus unhealthy supports and identify some examples of each  3)  generate ideas and descriptions of healthy supports that can be added  4)  offer mutual support about how to address unhealthy supports  5)  encourage active participation in and adherence to discharge plan    Summary of Patient Progress:  The patient did not attend group.  Therapeutic Modalities:   Motivational Interviewing Brief Solution-Focused Therapy  Rolanda Jay

## 2018-03-01 DIAGNOSIS — F419 Anxiety disorder, unspecified: Secondary | ICD-10-CM

## 2018-03-01 DIAGNOSIS — F332 Major depressive disorder, recurrent severe without psychotic features: Secondary | ICD-10-CM | POA: Diagnosis not present

## 2018-03-01 DIAGNOSIS — F1099 Alcohol use, unspecified with unspecified alcohol-induced disorder: Secondary | ICD-10-CM

## 2018-03-01 DIAGNOSIS — R45851 Suicidal ideations: Secondary | ICD-10-CM | POA: Diagnosis not present

## 2018-03-01 DIAGNOSIS — D649 Anemia, unspecified: Secondary | ICD-10-CM

## 2018-03-01 DIAGNOSIS — L0390 Cellulitis, unspecified: Secondary | ICD-10-CM | POA: Diagnosis not present

## 2018-03-01 DIAGNOSIS — F17210 Nicotine dependence, cigarettes, uncomplicated: Secondary | ICD-10-CM

## 2018-03-01 DIAGNOSIS — G4700 Insomnia, unspecified: Secondary | ICD-10-CM

## 2018-03-01 LAB — LIPID PANEL
CHOL/HDL RATIO: 3.5 ratio
Cholesterol: 128 mg/dL (ref 0–200)
HDL: 37 mg/dL — ABNORMAL LOW (ref >40)
LDL CALC: 64 mg/dL (ref 0–99)
Triglycerides: 137 mg/dL (ref <150)
VLDL: 27 mg/dL (ref 0–40)

## 2018-03-01 LAB — CBC WITH DIFFERENTIAL/PLATELET
BASOS ABS: 0 10*3/uL (ref 0.0–0.1)
Basophils Relative: 0 %
Eosinophils Absolute: 0.1 10*3/uL (ref 0.0–0.7)
Eosinophils Relative: 1 %
HEMATOCRIT: 26.3 % — AB (ref 36.0–46.0)
Hemoglobin: 8.5 g/dL — ABNORMAL LOW (ref 12.0–15.0)
Lymphocytes Relative: 37 %
Lymphs Abs: 2.3 10*3/uL (ref 0.7–4.0)
MCH: 31.7 pg (ref 26.0–34.0)
MCHC: 32.3 g/dL (ref 30.0–36.0)
MCV: 98.1 fL (ref 78.0–100.0)
MONOS PCT: 5 %
Monocytes Absolute: 0.3 10*3/uL (ref 0.1–1.0)
Neutro Abs: 3.6 10*3/uL (ref 1.7–7.7)
Neutrophils Relative %: 57 %
Platelets: 367 10*3/uL (ref 150–400)
RBC: 2.68 MIL/uL — ABNORMAL LOW (ref 3.87–5.11)
RDW: 16.3 % — AB (ref 11.5–15.5)
WBC: 6.3 10*3/uL (ref 4.0–10.5)

## 2018-03-01 LAB — HEMOGLOBIN A1C
Hgb A1c MFr Bld: 5 % (ref 4.8–5.6)
Mean Plasma Glucose: 96.8 mg/dL

## 2018-03-01 LAB — TSH: TSH: 2.385 u[IU]/mL (ref 0.350–4.500)

## 2018-03-01 LAB — T4, FREE: Free T4: 0.93 ng/dL (ref 0.82–1.77)

## 2018-03-01 MED ORDER — AMOXICILLIN-POT CLAVULANATE 875-125 MG PO TABS
1.0000 | ORAL_TABLET | Freq: Two times a day (BID) | ORAL | Status: DC
  Administered 2018-03-01 – 2018-03-04 (×6): 1 via ORAL
  Filled 2018-03-01: qty 14
  Filled 2018-03-01 (×5): qty 1
  Filled 2018-03-01: qty 14
  Filled 2018-03-01 (×4): qty 1

## 2018-03-01 NOTE — Progress Notes (Signed)
Recreation Therapy Notes  Date: 7.15.19 Time: 0930 Location: [address] Dayroom  Group Topic: Stress Management  Goal Area(s) Addresses:  Patient will verbalize importance of using healthy stress management.  Patient will identify positive emotions associated with healthy stress management.   Intervention: Stress Management  Activity :  Guided Imagery.  LRT introduced the stress management technique of guided imagery.  LRT read a script that allowed patients to picture their peaceful place.  Patients were to follow along as the script was read to engage in the activity.  Education:  Stress Management, Discharge Planning.   Education Outcome: Acknowledges edcuation/In group clarification offered/Needs additional education  Clinical Observations/Feedback: Pt did not attend group.      Victorino Sparrow, LRT/CTRS         Victorino Sparrow A 03/01/2018 12:27 PM

## 2018-03-01 NOTE — Tx Team (Signed)
Interdisciplinary Treatment and Diagnostic Plan Update  03/01/2018 Time of Session: 9242AS Ryleeann Urquiza MRN: [id number]  Principal Diagnosis: MDD (major depressive disorder), recurrent severe, without psychosis ([place])  Secondary Diagnoses: Principal Problem:   MDD (major depressive disorder), recurrent severe, without psychosis ([place])   Current Medications:  Current Facility-Administered Medications  Medication Dose Route Frequency Provider Last Rate Last Dose  . acetaminophen (TYLENOL) tablet 650 mg  650 mg Oral Q6H PRN Laverle Hobby, PA-C   650 mg at 02/28/18 2344  . citalopram (CELEXA) tablet 10 mg  10 mg Oral Daily Cobos, Myer Peer, MD   10 mg at 03/01/18 0757  . clindamycin (CLEOCIN) capsule 300 mg  300 mg Oral Q6H Cobos, Myer Peer, MD   300 mg at 03/01/18 0609  . diclofenac (VOLTAREN) EC tablet 75 mg  75 mg Oral BID PRN Nanci Pina, FNP   75 mg at 02/28/18 1120  . hydrOXYzine (ATARAX/VISTARIL) tablet 25 mg  25 mg Oral Q6H PRN Patriciaann Clan E, PA-C   25 mg at 03/01/18 0800  . ibuprofen (ADVIL,MOTRIN) tablet 400 mg  400 mg Oral Q6H PRN Cobos, Myer Peer, MD   400 mg at 03/01/18 0808  . risperiDONE (RISPERDAL) tablet 1 mg  1 mg Oral QHS Cobos, Myer Peer, MD   1 mg at 02/28/18 2225  . traZODone (DESYREL) tablet 100 mg  100 mg Oral QHS,MR X 1 Laverle Hobby, PA-C   100 mg at 02/28/18 2344   PTA Medications: Medications Prior to Admission  Medication Sig Dispense Refill Last Dose  . clindamycin (CLEOCIN) 300 MG capsule Take 1 capsule (300 mg total) by mouth every 6 (six) hours for 4 days. 16 capsule 0   . ibuprofen (ADVIL,MOTRIN) 800 MG tablet Take 1 tablet (800 mg total) by mouth every 6 (six) hours as needed for moderate pain. 30 tablet 0     Patient Stressors: Financial difficulties Marital or family conflict Medication change or noncompliance Occupational concerns Substance abuse Traumatic event  Patient Strengths: Average or above average  intelligence Communication skills  Treatment Modalities: Medication Management, Group therapy, Case management,  1 to 1 session with clinician, Psychoeducation, Recreational therapy.   Physician Treatment Plan for Primary Diagnosis: MDD (major depressive disorder), recurrent severe, without psychosis ([place]) Long Term Goal(s): Improvement in symptoms so as ready for discharge Improvement in symptoms so as ready for discharge   Short Term Goals: Ability to identify changes in lifestyle to reduce recurrence of condition will improve Ability to verbalize feelings will improve Ability to disclose and discuss suicidal ideas Ability to demonstrate self-control will improve Ability to identify and develop effective coping behaviors will improve Ability to maintain clinical measurements within normal limits will improve Compliance with prescribed medications will improve Ability to identify triggers associated with substance abuse/mental health issues will improve Ability to identify changes in lifestyle to reduce recurrence of condition will improve Ability to verbalize feelings will improve Ability to disclose and discuss suicidal ideas Ability to demonstrate self-control will improve Ability to identify and develop effective coping behaviors will improve Ability to maintain clinical measurements within normal limits will improve Compliance with prescribed medications will improve Ability to identify triggers associated with substance abuse/mental health issues will improve  Medication Management: Evaluate patient's response, side effects, and tolerance of medication regimen.  Therapeutic Interventions: 1 to 1 sessions, Unit Group sessions and Medication administration.  Evaluation of Outcomes: Progressing  Physician Treatment Plan for Secondary Diagnosis: Principal Problem:   MDD (major depressive disorder),  recurrent severe, without psychosis ([place])  Long Term Goal(s): Improvement in  symptoms so as ready for discharge Improvement in symptoms so as ready for discharge   Short Term Goals: Ability to identify changes in lifestyle to reduce recurrence of condition will improve Ability to verbalize feelings will improve Ability to disclose and discuss suicidal ideas Ability to demonstrate self-control will improve Ability to identify and develop effective coping behaviors will improve Ability to maintain clinical measurements within normal limits will improve Compliance with prescribed medications will improve Ability to identify triggers associated with substance abuse/mental health issues will improve Ability to identify changes in lifestyle to reduce recurrence of condition will improve Ability to verbalize feelings will improve Ability to disclose and discuss suicidal ideas Ability to demonstrate self-control will improve Ability to identify and develop effective coping behaviors will improve Ability to maintain clinical measurements within normal limits will improve Compliance with prescribed medications will improve Ability to identify triggers associated with substance abuse/mental health issues will improve     Medication Management: Evaluate patient's response, side effects, and tolerance of medication regimen.  Therapeutic Interventions: 1 to 1 sessions, Unit Group sessions and Medication administration.  Evaluation of Outcomes: Progressing   RN Treatment Plan for Primary Diagnosis: MDD (major depressive disorder), recurrent severe, without psychosis ([place]) Long Term Goal(s): Knowledge of disease and therapeutic regimen to maintain health will improve  Short Term Goals: Ability to remain free from injury will improve, Ability to disclose and discuss suicidal ideas and Ability to identify and develop effective coping behaviors will improve  Medication Management: RN will administer medications as ordered by provider, will assess and evaluate patient's response  and provide education to patient for prescribed medication. RN will report any adverse and/or side effects to prescribing provider.  Therapeutic Interventions: 1 on 1 counseling sessions, Psychoeducation, Medication administration, Evaluate responses to treatment, Monitor vital signs and CBGs as ordered, Perform/monitor CIWA, COWS, AIMS and Fall Risk screenings as ordered, Perform wound care treatments as ordered.  Evaluation of Outcomes: Progressing   LCSW Treatment Plan for Primary Diagnosis: MDD (major depressive disorder), recurrent severe, without psychosis ([place]) Long Term Goal(s): Safe transition to appropriate next level of care at discharge, Engage patient in therapeutic group addressing interpersonal concerns.  Short Term Goals: Engage patient in aftercare planning with referrals and resources, Facilitate patient progression through stages of change regarding substance use diagnoses and concerns and Identify triggers associated with mental health/substance abuse issues  Therapeutic Interventions: Assess for all discharge needs, 1 to 1 time with Social worker, Explore available resources and support systems, Assess for adequacy in community support network, Educate family and significant other(s) on suicide prevention, Complete Psychosocial Assessment, Interpersonal group therapy.  Evaluation of Outcomes: Progressing   Progress in Treatment: Attending groups: Yes. Participating in groups: Yes. Taking medication as prescribed: Yes. Toleration medication: Yes. Family/Significant other contact made: No, will contact:  family member if pt consents to collateral contact.  Patient understands diagnosis: Yes. Discussing patient identified problems/goals with staff: Yes. Medical problems stabilized or resolved: Yes. Denies suicidal/homicidal ideation: Yes. Issues/concerns per patient self-inventory: No. Other: n/a   New problem(s) identified: No, Describe:  n/a  New Short Term/Long  Term Goal(s): detox, medication management for mood stabilization; elimination of SI thoughts; development of comprehensive mental wellness/sobriety plan.   Patient Goals:  "I need help getting back on my medication and getting good follow-up care."   Discharge Plan or Barriers: CSW assessing for appropriate referrals. [facility] referral made. [facility] pamphlet, Mobile Crisis information,  and AA/NA information provided to patient for additional community support and resources.   Reason for Continuation of Hospitalization: Anxiety Depression Medication stabilization Withdrawal symptoms  Estimated Length of Stay: Wed, 03/03/18  Attendees: Patient: Celese Banner 03/01/2018 9:11 AM  Physician: Dr. Parke Poisson MD; Dr. Leverne Humbles MD 03/01/2018 9:11 AM  Nursing: Charlean Sanfilippo; Harrod RN 03/01/2018 9:11 AM  RN Care Manager:x 03/01/2018 9:11 AM  Social Worker: Janice Norrie LCSW 03/01/2018 9:11 AM  Recreational Therapist: x 03/01/2018 9:11 AM  Other: Lindell Spar NP 03/01/2018 9:11 AM  Other:  03/01/2018 9:11 AM  Other: 03/01/2018 9:11 AM    Scribe for Treatment Team: Avelina Laine, LCSW 03/01/2018 9:11 AM
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
## 2018-03-01 NOTE — [facility] Group Notes (Signed)
LCSW Group Therapy Note   03/01/2018 1:15pm   Type of Therapy and Topic:  Group Therapy:  Overcoming Obstacles   Participation Level:  Active   Description of Group:    In this group patients will be encouraged to explore what they see as obstacles to their own wellness and recovery. They will be guided to discuss their thoughts, feelings, and behaviors related to these obstacles. The group will process together ways to cope with barriers, with attention given to specific choices patients can make. Each patient will be challenged to identify changes they are motivated to make in order to overcome their obstacles. This group will be process-oriented, with patients participating in exploration of their own experiences as well as giving and receiving support and challenge from other group members.   Therapeutic Goals: 1. Patient will identify personal and current obstacles as they relate to admission. 2. Patient will identify barriers that currently interfere with their wellness or overcoming obstacles.  3. Patient will identify feelings, thought process and behaviors related to these barriers. 4. Patient will identify two changes they are willing to make to overcome these obstacles:      Summary of Patient Progress   Denise Brandt was attentive and engaged during today's processing group. She shared that her biggest obstacle involves "trying to find the doctor so I can go to the hospital and get my staples and stitches removed." Pt reports that her finger is also giving her a lot of pain and wants this looked at by a provider. Pt plans to discharge home and follow up at [facility] for outpatient mental health services.    Therapeutic Modalities:   Cognitive Behavioral Therapy Solution Focused Therapy Motivational Interviewing Relapse Prevention Therapy  Avelina Laine, LCSW 03/01/2018 1:07 PM

## 2018-03-01 NOTE — Progress Notes (Signed)
On initial approach, pt in the dayroom talking with peers and coloring.  She reports that her day was good and that she is having mild to moderate pain in her finger.  Pt was made aware that the dressing to her finger would be changed tomorrow and that the antibiotic was also changed and she would be starting it tonight.  Pt voiced understanding.  Pt denies SI/HI/AVH at this time.  She voiced no other needs or concerns.  She was given Motrin for pain and per request Vistaril 25 mg for anxiety at the beginning of the shift.  Pt received a second dose of Trazodone 100 mg before going to bed as she said she was not sleepy from the first dose.  Pt has been pleasant and cooperative with staff.  She makes her needs known to staff.  Support and encouragement offered.  Discharge plans are in process.  Safety maintained with q15 minute checks.

## 2018-03-01 NOTE — Progress Notes (Signed)
Patient ID: Denise Brandt, female   DOB: 1981/01/22, 37 y.o.   MRN: [id number] 03/01/18 6,40 PM Advised by Internal Medicine Hospitalist that patient's culture revealed resistance to clindamycin and recommendation is to discontinue Clindamycin and continue treatment with Augmentin 875/125 mgrs  FC

## 2018-03-01 NOTE — Progress Notes (Signed)
D:  Patient's self inventory sheet, patient has fair sleep, sleep medication helpful.  Good appetite, low energy level, poor concentration.  Rated depression, hopeless and anxiety 8.  Withdrawals, cravings, irritability.  Denied SI.  Physical problems.  Physical pain, worst pain #8 finger, pain medicine helpful.  Goal is establish plan.  Plans to stay awake.  Plans to have MD take out stitches.  No discharge plans. A:  Medications administered per MD orders.  Emotional support and encouragement given patient. R:  Denied SI and HI, contracts for safety.  Denied A/V hallucinations.  Safety maintained with MD orders.

## 2018-03-01 NOTE — Consult Note (Signed)
[place] nurse paged by [facility] staff. Requested to come and pack I&D site.  Patient had I&D per orthopedic surgeon 02/25/18. It is noted in the chart that the MD used 1/4 iodoform packing strip.  Patient is to follow up with hand surgeon this week for removal of packing however she has IVC orders and is now inpatient [facility] patient.  Discussed care with [facility] staff member.  Explained that the [facility] nurse services were consultative in nature.  We do not perform all wound care in the system. She had been given verbal order by [facility] staff to repack wound.  I have provided her with the needed [facility] # for 1/4" iodoform gauze packing strip and entered wound care orders for [facility] staff to change packing daily based on patient's tolerance.  Discussed POC with patient and bedside nurse.  Re consult if needed, will not follow at this time. Thanks  Liana Camerer [employer], RN,CWOCN, CNS, [place] [phone])

## 2018-03-01 NOTE — Plan of Care (Signed)
Nurse discussed depression, anxiety and coping skills with patient.  

## 2018-03-02 DIAGNOSIS — F332 Major depressive disorder, recurrent severe without psychotic features: Secondary | ICD-10-CM | POA: Diagnosis not present

## 2018-03-02 DIAGNOSIS — R45851 Suicidal ideations: Secondary | ICD-10-CM | POA: Diagnosis not present

## 2018-03-02 DIAGNOSIS — D649 Anemia, unspecified: Secondary | ICD-10-CM | POA: Diagnosis not present

## 2018-03-02 DIAGNOSIS — L0390 Cellulitis, unspecified: Secondary | ICD-10-CM | POA: Diagnosis not present

## 2018-03-02 LAB — AEROBIC/ANAEROBIC CULTURE W GRAM STAIN (SURGICAL/DEEP WOUND)

## 2018-03-02 LAB — CULTURE, BLOOD (ROUTINE X 2)
CULTURE: NO GROWTH
Culture: NO GROWTH
Special Requests: ADEQUATE
Special Requests: ADEQUATE

## 2018-03-02 LAB — AEROBIC/ANAEROBIC CULTURE (SURGICAL/DEEP WOUND)

## 2018-03-02 LAB — T3, FREE: T3, Free: 2 pg/mL (ref 2.0–4.4)

## 2018-03-02 MED ORDER — OLANZAPINE 5 MG PO TABS
5.0000 mg | ORAL_TABLET | Freq: Every day | ORAL | Status: DC
  Administered 2018-03-02 – 2018-03-03 (×2): 5 mg via ORAL
  Filled 2018-03-02 (×2): qty 1
  Filled 2018-03-02: qty 7
  Filled 2018-03-02: qty 2
  Filled 2018-03-02: qty 1

## 2018-03-02 NOTE — Progress Notes (Signed)
Recreation Therapy Notes  Animal-Assisted Activity (AAA) Program Checklist/Progress Notes Patient Eligibility Criteria Checklist & Daily Group note for Rec Tx Intervention  Date: 7.16.19 Time: 28 Location: [address]   AAA/T Program Assumption of Risk Form signed by Teacher, music or Parent Legal Guardian YES   Patient is free of allergies or sever asthma YES   Patient reports no fear of animals YES   Patient reports no history of cruelty to animals YES   Patient understands his/her participation is voluntary YES   Patient washes hands before animal contact  YES  Patient washes hands after animal contact  YES   Education: Contractor, Appropriate Animal Interaction   Education Outcome: Acknowledges understanding/In group clarification offered/Needs additional education.   Clinical Observations/Feedback: Pt did not attend activity.    Victorino Sparrow, LRT/CTRS         Victorino Sparrow A 03/02/2018 3:16 PM
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
## 2018-03-02 NOTE — Progress Notes (Signed)
Spoke with supply at [facility], who said the Kellie Simmering #[id number] 1/4 iodoform gauze packing strip is not in stock and is on backorder. Was referred to [facility] at [phone] and was told they will call back with info.

## 2018-03-02 NOTE — [facility] Group Notes (Signed)
[place] Group Notes:  (Nursing/MHT/Case Management/Adjunct)  Date:  03/02/2018  Time:  1445  Type of Therapy:  Nurse Education  Participation Level:  Active  Participation Quality:  Appropriate  Affect:  Appropriate  Cognitive:  Appropriate  Insight:  Appropriate  Engagement in Group:  Engaged  Modes of Intervention:  Discussion, Education, Socialization and Support  Summary of Progress/Problems: Pt was engaged during relaxation group.   Marya Landry 03/02/2018, 5:45 PM

## 2018-03-03 MED ORDER — HYDROXYZINE HCL 50 MG PO TABS
50.0000 mg | ORAL_TABLET | Freq: Once | ORAL | Status: AC
  Administered 2018-03-03: 50 mg via ORAL
  Filled 2018-03-03 (×2): qty 1

## 2018-03-03 NOTE — Plan of Care (Signed)
  Problem: Spiritual Needs Goal: Ability to function at adequate level Outcome: Progressing   Problem: Education: Goal: Knowledge of  General Education information/materials will improve Outcome: Progressing Goal: Emotional status will improve Outcome: Progressing Goal: Mental status will improve Outcome: Progressing Goal: Verbalization of understanding the information provided will improve Outcome: Progressing   Problem: Activity: Goal: Interest or engagement in activities will improve Outcome: Progressing Goal: Sleeping patterns will improve Outcome: Progressing   Problem: Coping: Goal: Ability to verbalize frustrations and anger appropriately will improve Outcome: Progressing Goal: Ability to demonstrate self-control will improve Outcome: Progressing   Problem: Health Behavior/Discharge Planning: Goal: Identification of resources available to assist in meeting health care needs will improve Outcome: Progressing Goal: Compliance with treatment plan for underlying cause of condition will improve Outcome: Progressing   Problem: Physical Regulation: Goal: Ability to maintain clinical measurements within normal limits will improve Outcome: Progressing   Problem: Safety: Goal: Periods of time without injury will increase Outcome: Progressing   Problem: Education: Goal: Utilization of techniques to improve thought processes will improve Outcome: Progressing Goal: Knowledge of the prescribed therapeutic regimen will improve Outcome: Progressing   Problem: Activity: Goal: Interest or engagement in leisure activities will improve Outcome: Progressing Goal: Imbalance in normal sleep/wake cycle will improve Outcome: Progressing   Problem: Coping: Goal: Coping ability will improve Outcome: Progressing Goal: Will verbalize feelings Outcome: Progressing   Problem: Health Behavior/Discharge Planning: Goal: Ability to make decisions will improve Outcome:  Progressing Goal: Compliance with therapeutic regimen will improve Outcome: Progressing   Problem: Role Relationship: Goal: Will demonstrate positive changes in social behaviors and relationships Outcome: Progressing   Problem: Safety: Goal: Ability to disclose and discuss suicidal ideas will improve Outcome: Progressing Goal: Ability to identify and utilize support systems that promote safety will improve Outcome: Progressing   Problem: Self-Concept: Goal: Will verbalize positive feelings about self Outcome: Progressing Goal: Level of anxiety will decrease Outcome: Progressing   Problem: Education: Goal: Ability to make informed decisions regarding treatment will improve Outcome: Progressing   Problem: Coping: Goal: Coping ability will improve Outcome: Progressing   Problem: Health Behavior/Discharge Planning: Goal: Identification of resources available to assist in meeting health care needs will improve Outcome: Progressing   Problem: Medication: Goal: Compliance with prescribed medication regimen will improve Outcome: Progressing   Problem: Self-Concept: Goal: Ability to disclose and discuss suicidal ideas will improve Outcome: Progressing Goal: Will verbalize positive feelings about self Outcome: Progressing   Problem: Education: Goal: Knowledge of disease or condition will improve Outcome: Progressing Goal: Understanding of discharge needs will improve Outcome: Progressing   Problem: Health Behavior/Discharge Planning: Goal: Ability to identify changes in lifestyle to reduce recurrence of condition will improve Outcome: Progressing Goal: Identification of resources available to assist in meeting health care needs will improve Outcome: Progressing   Problem: Physical Regulation: Goal: Complications related to the disease process, condition or treatment will be avoided or minimized Outcome: Progressing   Problem: Safety: Goal: Ability to remain free from  injury will improve Outcome: Progressing
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
## 2018-03-03 NOTE — Progress Notes (Addendum)
Patient was pleasant and anxious upon approach. Patient complained of anxiety rated 7 out of 10, and a headache rated 3 out of 10. Meds were given. Patient told writer she was nervous about being discharged soon, but felt better than when she came in. Support and encouragement given. Patient rated depression and anxiety 7 and 9 out of 10. Patient endorses cravings, agitation, and irritability.  Patient compliant with medications prescribed per provider.  Safety maintained with 15 minute checks as well as environmental checks. Will continue to monitor.

## 2018-03-03 NOTE — Progress Notes (Signed)
Pt attended NA group this evening.  

## 2018-03-03 NOTE — Progress Notes (Signed)
Pt reports her day has been ok.  She has been concerned about her finger, because it was late in the day that the supplies came to redress her finger.  Pt says the sutures and staples were removed from her head and neck today.  She picked at the bandage until it came off.  Pt told writer that the provider did not wrap it correctly, and pt did not seem to be satisfied until she could wrap it herself.  Writer observed that the pt did not use the packing correctly, but will report to next shift about pt's obsession with doing it herself.  Pt needs the provider to educate her on the procedure for taking care of her finger as she is headstrong and will do it as she thinks it needs to be done.  Pt denies SI/HI/AVH at this time.  She has been in the dayroom most of the evening, at times talking on the phone.  Pt plans to return home at discharge.  Support and encouragement offered.  Discharge plans are in process.  Safety maintained with q15 minute checks.

## 2018-03-03 NOTE — Progress Notes (Signed)
Pt reports she had a good day.  She denies SI/HI/AVH.  She denies any withdrawal symptoms at this time.  She says that her anxiety continues to be high and she feels the Vistaril needs to be a higher dose of more frequent.  She said she talked to the NP today, but no changes were made.  Writer also spoke to her about her finger.  It is still swollen, but pt had taken the bandage off.  "It needs to be open to air."  At bedtime, pt asked for the supplies to wrap her finger.  She is not accepting to RN's instruction of how her finger is supposed to be packed and bandage, and she is using the packing dressing to wrap her finger before putting on the bandage.  Pt reports she has had a headache most of the day and requested Motrin which she was given.  Pt is otherwise cooperative and appropriate.  Support and encouragement offered.  Discharge plans are in process.  Safety maintained with q15 minute checks.

## 2018-03-03 NOTE — BHH Suicide Risk Assessment (Signed)
[place] INPATIENT:  Family/Significant Other Suicide Prevention Education  Suicide Prevention Education:  Patient Refusal for Family/Significant Other Suicide Prevention Education: The patient Denise Brandt has refused to provide written consent for family/significant other to be provided Family/Significant Other Suicide Prevention Education during admission and/or prior to discharge.  Physician notified.  SPE completed with pt, as pt refused to consent to family contact. SPI pamphlet provided to pt and pt was encouraged to share information with support network, ask questions, and talk about any concerns relating to SPE. Pt denies access to guns/firearms and verbalized understanding of information provided. Mobile Crisis information also provided to pt.   Avelina Laine LCSW 03/03/2018, 1:30 PM

## 2018-03-03 NOTE — BHH Group Notes (Signed)
[facility] Group Notes:  (Nursing/MHT/Case Management/Adjunct)  Date:  03/03/2018  Time:  4:30 pm  Type of Therapy:  Psychoeducational Skills  Participation Level:  Did Not Attend  Participation Quality:    Affect:    Cognitive:    Insight:    Engagement in Group:    Modes of Intervention:    Summary of Progress/Problems:  Cammy Copa 03/03/2018, 6:04 PM

## 2018-03-03 NOTE — Therapy (Signed)
Occupational Therapy Group Note  Date:  03/03/2018 Time:  12:54 PM  Group Topic/Focus:  Stress Management  Participation Level:  Active  Participation Quality:  Appropriate  Affect:  Appropriate  Cognitive:  Appropriate  Insight: Improving  Engagement in Group:  Engaged  Modes of Intervention:  Activity, Discussion, Education and Socialization  Additional Comments:    S: "Comedy movies help the most when I am feeling down"   O: Education given on a variety of stress management coping skills in the rleation of bingo. Coping skills bingo played with education given on variety of coping skills between bingo calls. Pts encouraged to share experience with various coping skills and share what has worked for them with others. Coloring and relaxation guide handouts given at the end of the session.  ?  A: Pt presents to group with appropriate affectt, engaged and participatory throughout. Pt explotred various stress management coping skills and engaged in bingo game with much enjoyment. Pt and peers started exploring the idea of white water rafting. Eager to accept relaxation and coloring handouts at end of session.  ?  P: Pt provided with education on stress management activities to implement into daily routine. Handouts given to facilitate carryover when reintegrating into community.       Zenovia Jarred, MSOT, OTR/L  [place] 03/03/2018, 12:54 PM

## 2018-03-04 ENCOUNTER — Encounter (HOSPITAL_COMMUNITY): Payer: Self-pay | Admitting: Behavioral Health

## 2018-03-04 MED ORDER — TRAZODONE HCL 100 MG PO TABS: 100.0000 mg | ORAL_TABLET | Freq: Every evening | ORAL | 0 refills | Status: DC | PRN

## 2018-03-04 MED ORDER — CITALOPRAM HYDROBROMIDE 10 MG PO TABS: 10.0000 mg | ORAL_TABLET | Freq: Every day | ORAL | 0 refills | Status: DC

## 2018-03-04 MED ORDER — AMOXICILLIN-POT CLAVULANATE 875-125 MG PO TABS: 1.0000 | ORAL_TABLET | Freq: Two times a day (BID) | ORAL | 0 refills | Status: DC

## 2018-03-04 MED ORDER — HYDROXYZINE HCL 50 MG PO TABS: 50.0000 mg | ORAL_TABLET | Freq: Four times a day (QID) | ORAL | 0 refills | Status: DC | PRN

## 2018-03-04 MED ORDER — HYDROXYZINE HCL 50 MG PO TABS
50.0000 mg | ORAL_TABLET | Freq: Four times a day (QID) | ORAL | Status: DC | PRN
  Filled 2018-03-04: qty 10

## 2018-03-04 MED ORDER — HYDROXYZINE HCL 25 MG PO TABS: 25.0000 mg | ORAL_TABLET | Freq: Four times a day (QID) | ORAL | 0 refills | Status: DC | PRN

## 2018-03-04 MED ORDER — OLANZAPINE 5 MG PO TABS: 5.0000 mg | ORAL_TABLET | Freq: Every day | ORAL | 0 refills | Status: DC

## 2018-03-04 NOTE — Progress Notes (Signed)
  [facility] Adult Case Management Discharge Plan :  Will you be returning to the same living situation after discharge:  No. Patient states she is going to live with her boyfriend after discharge.  At discharge, do you have transportation home?: Yes,  patient was provided with a bus pass.  Do you have the ability to pay for your medications: Yes,  Patient to work with [place].   Release of information consent forms completed and in the chart;  Patient's signature needed at discharge.  Patient to Follow up at: Follow-up Information    [facility] Follow up on 03/08/2018.   Specialty:  Behavioral Health Why:  Hospital follow-up on Monday, 7/22 at 8:15AM. Please bring: photo ID, social security card, any proof of income if you have it, and hospital discharge paperwork. Thank you.  Contact information: [address] [place] [address] [phone]           Next level of care provider has access to [facility] and Suicide Prevention discussed: Yes,  completed with patient, Albertia Carvin  Have you used any form of tobacco in the last 30 days? (Cigarettes, Smokeless Tobacco, Cigars, and/or Pipes): Yes  Has patient been referred to the Quitline?: Patient refused referral  Patient has been referred for addiction treatment: Yes  Virgilio Frees, LCSW 03/04/2018, 11:41 AM
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
## 2018-03-04 NOTE — Plan of Care (Signed)
  Problem: Spiritual Needs Goal: Ability to function at adequate level Outcome: Adequate for Discharge   Problem: Education: Goal: Knowledge of [facility] Education information/materials will improve Outcome: Adequate for Discharge Goal: Emotional status will improve Outcome: Adequate for Discharge Goal: Mental status will improve Outcome: Adequate for Discharge Goal: Verbalization of understanding the information provided will improve Outcome: Adequate for Discharge   Problem: Activity: Goal: Interest or engagement in activities will improve Outcome: Adequate for Discharge Goal: Sleeping patterns will improve Outcome: Adequate for Discharge   Problem: Coping: Goal: Ability to verbalize frustrations and anger appropriately will improve Outcome: Adequate for Discharge Goal: Ability to demonstrate self-control will improve Outcome: Adequate for Discharge   Problem: Health Behavior/Discharge Planning: Goal: Identification of resources available to assist in meeting health care needs will improve Outcome: Adequate for Discharge Goal: Compliance with treatment plan for underlying cause of condition will improve Outcome: Adequate for Discharge   Problem: Physical Regulation: Goal: Ability to maintain clinical measurements within normal limits will improve Outcome: Adequate for Discharge   Problem: Safety: Goal: Periods of time without injury will increase Outcome: Adequate for Discharge   Problem: Education: Goal: Utilization of techniques to improve thought processes will improve Outcome: Adequate for Discharge Goal: Knowledge of the prescribed therapeutic regimen will improve Outcome: Adequate for Discharge   Problem: Activity: Goal: Interest or engagement in leisure activities will improve Outcome: Adequate for Discharge Goal: Imbalance in normal sleep/wake cycle will improve Outcome: Adequate for Discharge   Problem: Coping: Goal: Coping ability will  improve Outcome: Adequate for Discharge Goal: Will verbalize feelings Outcome: Adequate for Discharge   Problem: Health Behavior/Discharge Planning: Goal: Ability to make decisions will improve Outcome: Adequate for Discharge Goal: Compliance with therapeutic regimen will improve Outcome: Adequate for Discharge   Problem: Role Relationship: Goal: Will demonstrate positive changes in social behaviors and relationships Outcome: Adequate for Discharge   Problem: Safety: Goal: Ability to disclose and discuss suicidal ideas will improve Outcome: Adequate for Discharge Goal: Ability to identify and utilize support systems that promote safety will improve Outcome: Adequate for Discharge   Problem: Self-Concept: Goal: Will verbalize positive feelings about self Outcome: Adequate for Discharge Goal: Level of anxiety will decrease Outcome: Adequate for Discharge   Problem: Education: Goal: Ability to make informed decisions regarding treatment will improve Outcome: Adequate for Discharge   Problem: Coping: Goal: Coping ability will improve Outcome: Adequate for Discharge   Problem: Health Behavior/Discharge Planning: Goal: Identification of resources available to assist in meeting health care needs will improve Outcome: Adequate for Discharge   Problem: Medication: Goal: Compliance with prescribed medication regimen will improve Outcome: Adequate for Discharge   Problem: Self-Concept: Goal: Ability to disclose and discuss suicidal ideas will improve Outcome: Adequate for Discharge Goal: Will verbalize positive feelings about self Outcome: Adequate for Discharge   Problem: Education: Goal: Knowledge of disease or condition will improve Outcome: Adequate for Discharge Goal: Understanding of discharge needs will improve Outcome: Adequate for Discharge   Problem: Health Behavior/Discharge Planning: Goal: Ability to identify changes in lifestyle to reduce recurrence of  condition will improve Outcome: Adequate for Discharge Goal: Identification of resources available to assist in meeting health care needs will improve Outcome: Adequate for Discharge   Problem: Physical Regulation: Goal: Complications related to the disease process, condition or treatment will be avoided or minimized Outcome: Adequate for Discharge   Problem: Safety: Goal: Ability to remain free from injury will improve Outcome: Adequate for Discharge

## 2018-03-04 NOTE — Progress Notes (Addendum)
Patient self inventory- Patient slept fair last night, sleep medication was requested and it was helpful. Appetite has been good, energy level low, and concentration poor. Patient is experiencing cravings, agitation, and irritability. Denies SI HI AVH. Denies physical pain and physical problems. Patient's goal is "not feel anxious about going home."  Patient compliant with medication administration prescribed per provider. Safety maintained with 15 minute checks as well as environmental checks. Will continue to monitor.

## 2018-03-04 NOTE — Progress Notes (Signed)
Discharge note: Patient reviewed discharge paperwork with RN including prescriptions, follow up appointments, and lab work. Patient given the opportunity to ask questions. All concerns were addressed. All belongings were returned to patient. Denied SI/HI/AVH. Patient thanked staff for their care while at the hospital. Patient discharged to lobby with two bus passes in hand.
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
## 2019-01-20 ENCOUNTER — Emergency Department (HOSPITAL_COMMUNITY)
Admission: EM | Admit: 2019-01-20 | Discharge: 2019-01-21 | Disposition: A | Discharge status: Home / Self Care | Payer: Self-pay | Attending: Emergency Medicine | Admitting: Emergency Medicine

## 2019-01-20 ENCOUNTER — Encounter (HOSPITAL_COMMUNITY): Payer: Self-pay | Admitting: Emergency Medicine

## 2019-01-20 ENCOUNTER — Other Ambulatory Visit: Payer: Self-pay

## 2019-01-20 DIAGNOSIS — Y939 Activity, unspecified: Secondary | ICD-10-CM | POA: Insufficient documentation

## 2019-01-20 DIAGNOSIS — R21 Rash and other nonspecific skin eruption: Secondary | ICD-10-CM | POA: Insufficient documentation

## 2019-01-20 DIAGNOSIS — Z23 Encounter for immunization: Secondary | ICD-10-CM | POA: Insufficient documentation

## 2019-01-20 DIAGNOSIS — F329 Major depressive disorder, single episode, unspecified: Secondary | ICD-10-CM | POA: Insufficient documentation

## 2019-01-20 DIAGNOSIS — S7012XA Contusion of left thigh, initial encounter: Secondary | ICD-10-CM | POA: Insufficient documentation

## 2019-01-20 DIAGNOSIS — F17290 Nicotine dependence, other tobacco product, uncomplicated: Secondary | ICD-10-CM | POA: Insufficient documentation

## 2019-01-20 DIAGNOSIS — Z1159 Encounter for screening for other viral diseases: Secondary | ICD-10-CM | POA: Insufficient documentation

## 2019-01-20 DIAGNOSIS — F1290 Cannabis use, unspecified, uncomplicated: Secondary | ICD-10-CM | POA: Insufficient documentation

## 2019-01-20 DIAGNOSIS — S0083XA Contusion of other part of head, initial encounter: Secondary | ICD-10-CM | POA: Insufficient documentation

## 2019-01-20 DIAGNOSIS — Y999 Unspecified external cause status: Secondary | ICD-10-CM | POA: Insufficient documentation

## 2019-01-20 DIAGNOSIS — Z79899 Other long term (current) drug therapy: Secondary | ICD-10-CM | POA: Insufficient documentation

## 2019-01-20 DIAGNOSIS — Y929 Unspecified place or not applicable: Secondary | ICD-10-CM | POA: Insufficient documentation

## 2019-01-20 DIAGNOSIS — R45851 Suicidal ideations: Secondary | ICD-10-CM | POA: Insufficient documentation

## 2019-01-20 LAB — RAPID URINE DRUG SCREEN, HOSP PERFORMED
Amphetamines: POSITIVE — AB
Barbiturates: NOT DETECTED
Benzodiazepines: POSITIVE — AB
Cocaine: POSITIVE — AB
Opiates: NOT DETECTED
Tetrahydrocannabinol: POSITIVE — AB

## 2019-01-20 LAB — COMPREHENSIVE METABOLIC PANEL
ALT: 25 U/L (ref 0–44)
AST: 35 U/L (ref 15–41)
Albumin: 4.2 g/dL (ref 3.5–5.0)
Alkaline Phosphatase: 80 U/L (ref 38–126)
Anion gap: 13 (ref 5–15)
BUN: 9 mg/dL (ref 6–20)
CO2: 20 mmol/L — ABNORMAL LOW (ref 22–32)
Calcium: 9.6 mg/dL (ref 8.9–10.3)
Chloride: 107 mmol/L (ref 98–111)
Creatinine, Ser: 0.9 mg/dL (ref 0.44–1.00)
GFR calc Af Amer: >60 mL/min (ref >60)
GFR calc non Af Amer: >60 mL/min (ref >60)
Glucose, Bld: 99 mg/dL (ref 70–99)
Potassium: 3.7 mmol/L (ref 3.5–5.1)
Sodium: 140 mmol/L (ref 135–145)
Total Bilirubin: 0.4 mg/dL (ref 0.3–1.2)
Total Protein: 7.7 g/dL (ref 6.5–8.1)

## 2019-01-20 LAB — CBC
HCT: 39.1 % (ref 36.0–46.0)
Hemoglobin: 12.8 g/dL (ref 12.0–15.0)
MCH: 31.4 pg (ref 26.0–34.0)
MCHC: 32.7 g/dL (ref 30.0–36.0)
MCV: 95.8 fL (ref 80.0–100.0)
Platelets: 295 10*3/uL (ref 150–400)
RBC: 4.08 MIL/uL (ref 3.87–5.11)
RDW: 14.5 % (ref 11.5–15.5)
WBC: 7.9 10*3/uL (ref 4.0–10.5)
nRBC: 0 % (ref 0.0–0.2)

## 2019-01-20 LAB — ACETAMINOPHEN LEVEL: Acetaminophen (Tylenol), Serum: <10 ug/mL — ABNORMAL LOW (ref 10–30)

## 2019-01-20 LAB — SARS CORONAVIRUS 2 BY RT PCR (HOSPITAL ORDER, PERFORMED IN ~~LOC~~ HOSPITAL LAB): SARS Coronavirus 2: NEGATIVE

## 2019-01-20 LAB — RAPID HIV SCREEN (HIV 1/2 AB+AG)
HIV 1/2 Antibodies: NONREACTIVE
HIV-1 P24 Antigen - HIV24: NONREACTIVE

## 2019-01-20 LAB — ETHANOL: Alcohol, Ethyl (B): 15 mg/dL — ABNORMAL HIGH (ref <10)

## 2019-01-20 LAB — I-STAT BETA HCG BLOOD, ED (MC, WL, AP ONLY): I-stat hCG, quantitative: <5 m[IU]/mL (ref <5)

## 2019-01-20 LAB — SALICYLATE LEVEL: Salicylate Lvl: <7 mg/dL (ref 2.8–30.0)

## 2019-01-20 MED ORDER — ULIPRISTAL ACETATE 30 MG PO TABS
30.0000 mg | ORAL_TABLET | Freq: Once | ORAL | Status: AC
  Administered 2019-01-20: 30 mg via ORAL
  Filled 2019-01-20: qty 1

## 2019-01-20 MED ORDER — TETANUS-DIPHTH-ACELL PERTUSSIS 5-2.5-18.5 LF-MCG/0.5 IM SUSP
0.5000 mL | Freq: Once | INTRAMUSCULAR | Status: AC
  Administered 2019-01-20: 0.5 mL via INTRAMUSCULAR
  Filled 2019-01-20: qty 0.5

## 2019-01-20 MED ORDER — CEFTRIAXONE SODIUM 250 MG IJ SOLR
250.0000 mg | Freq: Once | INTRAMUSCULAR | Status: AC
  Administered 2019-01-20: 250 mg via INTRAMUSCULAR
  Filled 2019-01-20: qty 250

## 2019-01-20 MED ORDER — LIDOCAINE HCL (PF) 1 % IJ SOLN
0.9000 mL | Freq: Once | INTRAMUSCULAR | Status: AC
  Administered 2019-01-20: 0.9 mL
  Filled 2019-01-20: qty 5

## 2019-01-20 MED ORDER — AZITHROMYCIN 250 MG PO TABS
1000.0000 mg | ORAL_TABLET | Freq: Once | ORAL | Status: AC
  Administered 2019-01-20: 1000 mg via ORAL
  Filled 2019-01-20: qty 4
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
## 2019-01-20 NOTE — ED Notes (Signed)
Off duty [employer] officer at bedside to speak with pt. Pt crying at this time. Pt denies pain at this time and states that she is "just upset".
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
## 2019-01-20 NOTE — ED Notes (Signed)
Pt refusing to have a pelvic exam done by PA, states she is already hurting a lot.

## 2019-01-20 NOTE — ED Notes (Signed)
Pt changed on purple scrubs.

## 2019-01-20 NOTE — Discharge Instructions (Addendum)
Follow-up with the women's shelter, [facility], return for any new or worsening symptoms

## 2019-01-20 NOTE — ED Notes (Signed)
TTS in progress 

## 2019-01-20 NOTE — Progress Notes (Addendum)
Consult request has been received. CSW covering [facility] from [facility] attempting to follow up at present time.  CSW notes TTS assessment in progress.  Marylou Flesher, LCSW, [place] Social Worker [phone]

## 2019-01-20 NOTE — BHH Counselor (Signed)
Clinician called TTS cart and received an call end message. Clinician called Garlon Hatchet, RN to discuss the cart. Clinician noted from RN she is trying to fix the TTS cart. Clinician expressed she will call back in about 10 minutes.     Vertell Novak, [place], [facility], [facility] Triage Specialist [phone]
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
## 2019-01-20 NOTE — ED Notes (Signed)
Pt. Refusing pelvic exam at this time.

## 2019-01-20 NOTE — SANE Note (Signed)
The SANE/FNE Naval architect) consult has been completed. The primary nurse and provider have been notified. At this time, patient declined all SANE services. Please contact the SANE/FNE nurse on call (listed in [place]) with any further concerns.

## 2019-01-20 NOTE — BHH Counselor (Signed)
Pt denies, family, friend supports to contact to receive collateral information.    Vertell Novak, MS, [facility], [facility] Triage Specialist [phone].

## 2019-01-20 NOTE — ED Notes (Signed)
SANE RN Traci at bedside.
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
## 2019-01-20 NOTE — ED Notes (Signed)
[employer] officer at bedside to talk with patient and get the report.

## 2019-01-20 NOTE — Progress Notes (Addendum)
CSW called [facility] Crisis [facility] to ascertain the bed availability in either the [facility] or the [facility], should the pt need or require a confidential shelter/place to go.  CSW was told that this would depend upon the result of an assesment that would need to be complete by phone if pt was willing.  CSW did not disclose pt's name to [employer].  10:09 PM CSW following up but reviewed chart and sees pt has been recommended for inpatient psychiatric hospitalization, per TTS notes.  CSW will continue to follow for D/C needs.  Alphonse Guild. Riyanna Crutchley, LCSW, LCAS, [facility] Transitions of Care Clinical Social Worker Care Coordination Department Ph: [phone]

## 2019-01-21 ENCOUNTER — Inpatient Hospital Stay (HOSPITAL_COMMUNITY): Admit: 2019-01-21 | Payer: Self-pay | Admitting: Psychiatry

## 2019-01-21 DIAGNOSIS — F10988 Alcohol use, unspecified with other alcohol-induced disorder: Secondary | ICD-10-CM

## 2019-01-21 DIAGNOSIS — R440 Auditory hallucinations: Secondary | ICD-10-CM

## 2019-01-21 DIAGNOSIS — F12988 Cannabis use, unspecified with other cannabis-induced disorder: Secondary | ICD-10-CM

## 2019-01-21 DIAGNOSIS — F329 Major depressive disorder, single episode, unspecified: Secondary | ICD-10-CM

## 2019-01-21 LAB — HIV ANTIBODY (ROUTINE TESTING W REFLEX): HIV Screen 4th Generation wRfx: NONREACTIVE

## 2019-01-21 MED ORDER — HYDROCORTISONE 1 % EX CREA
TOPICAL_CREAM | Freq: Two times a day (BID) | CUTANEOUS | Status: DC
  Filled 2019-01-21: qty 28

## 2019-01-21 MED ORDER — OLANZAPINE 5 MG PO TABS: 5.0000 mg | ORAL_TABLET | Freq: Every day | ORAL | 0 refills | Status: DC

## 2019-01-21 MED ORDER — SULFAMETHOXAZOLE-TRIMETHOPRIM 800-160 MG PO TABS: 1.0000 | ORAL_TABLET | Freq: Two times a day (BID) | ORAL | 0 refills | Status: AC

## 2019-01-21 MED ORDER — SULFAMETHOXAZOLE-TRIMETHOPRIM 800-160 MG PO TABS: 1.0000 | ORAL_TABLET | Freq: Two times a day (BID) | ORAL | Status: DC

## 2019-01-21 MED ORDER — VALACYCLOVIR HCL 500 MG PO TABS: 1000.0000 mg | ORAL_TABLET | Freq: Two times a day (BID) | ORAL | Status: DC

## 2019-01-21 MED ORDER — DIPHENHYDRAMINE HCL 25 MG PO CAPS: 25.0000 mg | ORAL_CAPSULE | Freq: Once | ORAL | Status: DC

## 2019-01-21 MED ORDER — DIPHENHYDRAMINE HCL 25 MG PO CAPS
25.0000 mg | ORAL_CAPSULE | Freq: Once | ORAL | Status: DC
  Filled 2019-01-21: qty 1

## 2019-01-21 MED ORDER — CITALOPRAM HYDROBROMIDE 10 MG PO TABS
10.0000 mg | ORAL_TABLET | Freq: Every day | ORAL | Status: DC
  Administered 2019-01-21: 10 mg via ORAL
  Filled 2019-01-21: qty 1

## 2019-01-21 MED ORDER — CITALOPRAM HYDROBROMIDE 10 MG PO TABS: 10.0000 mg | ORAL_TABLET | Freq: Every day | ORAL | 0 refills | Status: DC

## 2019-01-21 MED ORDER — ACYCLOVIR 400 MG PO TABS: 400.0000 mg | ORAL_TABLET | Freq: Four times a day (QID) | ORAL | 0 refills | Status: DC

## 2019-01-21 MED ORDER — DIPHENHYDRAMINE HCL 25 MG PO CAPS
25.0000 mg | ORAL_CAPSULE | Freq: Once | ORAL | Status: AC
  Administered 2019-01-21: 25 mg via ORAL
  Filled 2019-01-21: qty 1

## 2019-01-21 MED ORDER — OLANZAPINE 5 MG PO TABS: 5.0000 mg | ORAL_TABLET | Freq: Every day | ORAL | Status: DC

## 2019-01-21 NOTE — Progress Notes (Addendum)
CSW received a call from the [facility] ED [facility] RN [place] who is facilitating pt's meds for a free or reduced price that are at the [facility] at [facility] and will bring the info and the taxi voucher's to the pt's unit when ready.  CSW will continue to follow for D/C needs.  Alphonse Guild. Bethene Hankinson, LCSW, LCAS, [facility] Transitions of Care Clinical Social Worker Care Coordination Department Ph: [phone]
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
## 2019-01-21 NOTE — Progress Notes (Addendum)
CSW received a call from [place] who stated she would have to speak by phone to the pt and complete an assessment and then an intake if pt is appropriate.  CSW received a call from pt's RN pt had been accepted to the:  [facility].  Pt will need a taxi voucher from:   [employer] at [address] to the:  [employer] at [employer]  [address] [place], [place] [address].  Pt will then need a voucher from:   [facility] to:  [facility] [place]  [place], [place] [address].  Once the pt arrives to the [place] she will need to call [phone] and the Crisis Worker will come out to get her.  [facility] ED TOC RN CM Mariann Laster will bring pt her taxi vouchers to the unit.  RN/EDP updated.  CSW will continue to follow for D/C needs.  Alphonse Guild. Jabril Pursell, LCSW, LCAS, CSI Transitions of Care Clinical Social Worker Care Coordination Department Ph: [phone]
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
## 2019-01-21 NOTE — Progress Notes (Signed)
2nd shift ED CSW received a handoff from the 1st shift [facility] ED CSW.   CSW called (HIPPA-compliantly) [facility] the [facility] Crisis Line to ascertain the bed availability in either the [facility] or the [facility], should the pt need or require a confidential shelter/place to go.  CSW has been informed in the past that this would depend upon the result of an assesment that would need to be complete by phone if pt was willing.  CSW did not disclose pt's name to [employer].  [facility] Crisis Counselor stated she would have to call and ascertain bed availability and then call the CSW back.  CSW will continue to follow for D/C needs.  Alphonse Guild. Donovan Persley, LCSW, LCAS, CSI Transitions of Care Clinical Social Worker Care Coordination Department Ph: [phone]

## 2019-01-21 NOTE — ED Provider Notes (Signed)
10:36 AM I was called by nursing staff as the patient was complaining of itching and burning rash to her bottom.  I evaluated the rash which she states is "inside my vagina" and extends along the perineum and right buttocks.  She has erythematous, swollen regions with both vesicles and pustules.  Looks like it may be a herpetic eruption however with the pustular formations I will also cover for potential staph infection. I added on a prescription for acyclovir and Bactrim.   Margarita Mail, PA-C 01/21/19 1213    Maudie Flakes, MD 01/26/19 [phone]

## 2019-01-21 NOTE — ED Notes (Signed)
Staffing called for SI sitter. None available at the moment. Pt currently denying SI/HI thoughts. Will continue to monitor
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
## 2019-01-21 NOTE — Care Management (Addendum)
ED CM received call from [facility] ED CSW concerning patient needing taxi voucher, CM noted patient to be uninsured patient being discharged with sevral prescriptions, Patient enrolled in [facility] program letter printed and given to patient with instructions on how to redeem. Patient was instructed by [facility] ED CSW to take taxi  to [facility] to pick up meds then continue on to [place]. CM reviewed the instructions with patient wh verbalized understanding and teach back done. [employer] called and verified that driver will wait for patient.  Updated [facility] nurse on [facility].  No additional ED CM needs identified.  Laurena Slimmer RN, BSN  ED Nurse Care Manager [phone]
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
## 2019-01-21 NOTE — Progress Notes (Signed)
CSW received call from [place] at [facility] who stated that she made contact with the patient's family member Miki Kins who reports that she has made several attempts in the past to assist this patient and that the patient could not come stay with her due to the two children being in the home. The aunt did report that this patient has a sister named Humphrey Rolls (no known number) and a unnamed brother who are both located in [place].  CSW will leave handoff for 2nd shift ED to follow up with patient regarding her brother and sister.  Madilyn Fireman, MSW, LCSW-A Clinical Social Worker [facility] [phone]

## 2019-05-13 IMAGING — CR DG HAND COMPLETE 3+V*R*
3 series · 3 of 3 positions shown · non-contrast
Comparison: None.

CLINICAL DATA: Right hand pain. Laceration of middle finger a few
days ago, with swelling.

EXAM:
RIGHT HAND - COMPLETE 3+ VIEW

[hand pa]
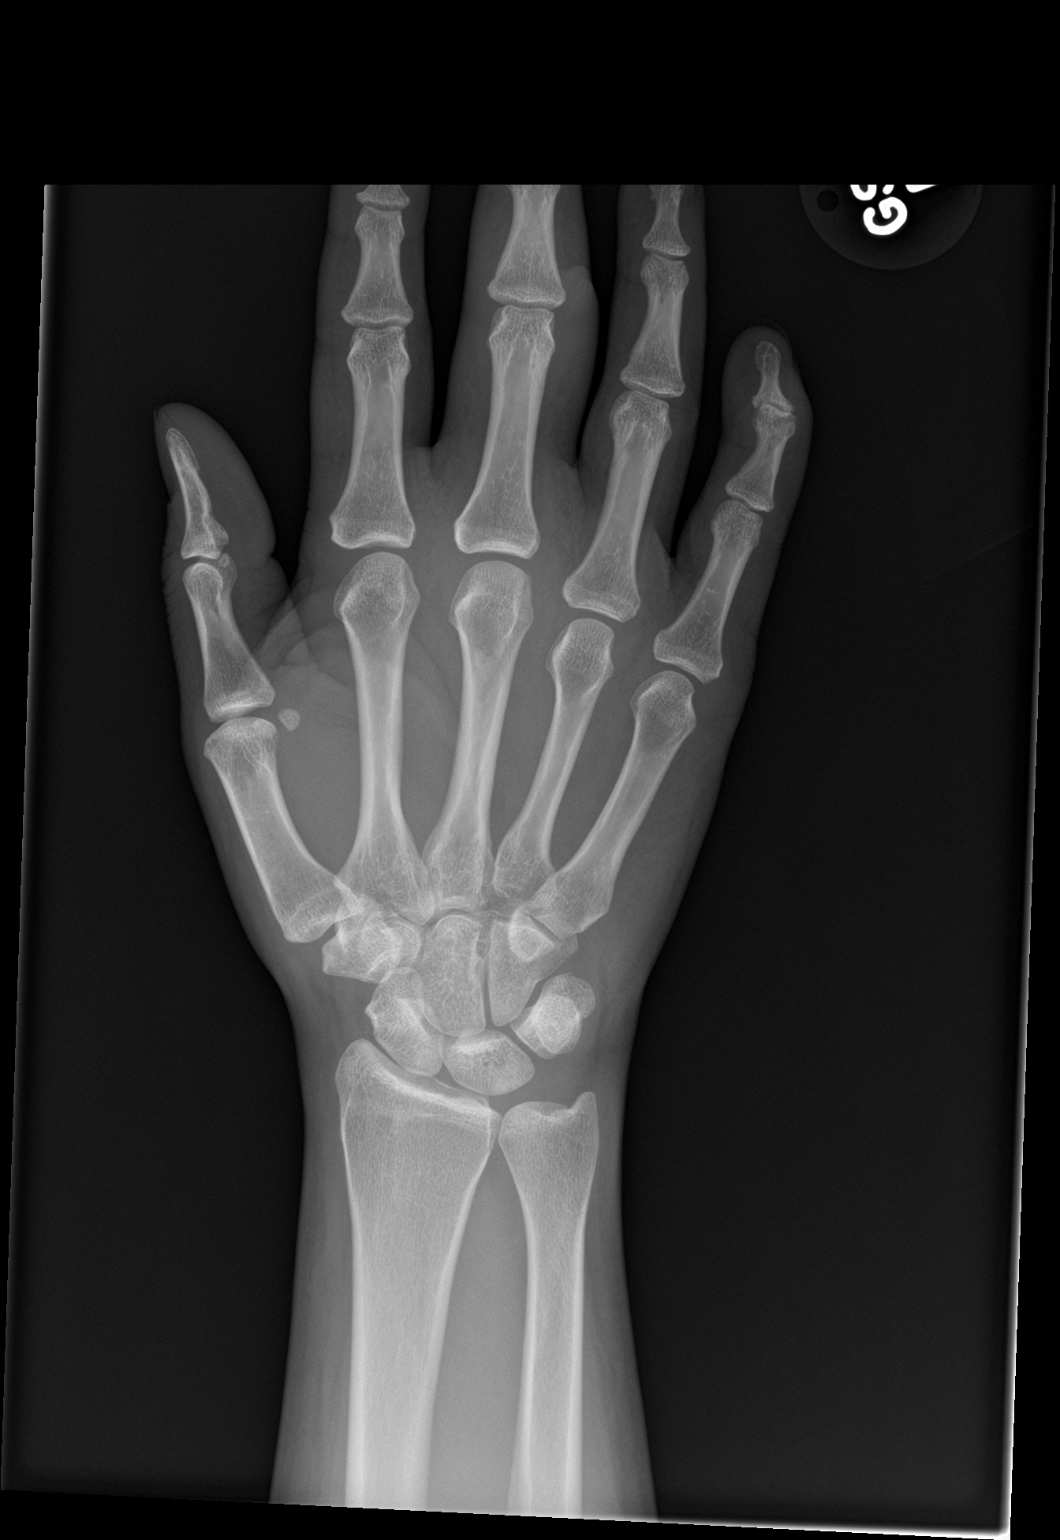

[hand obl]
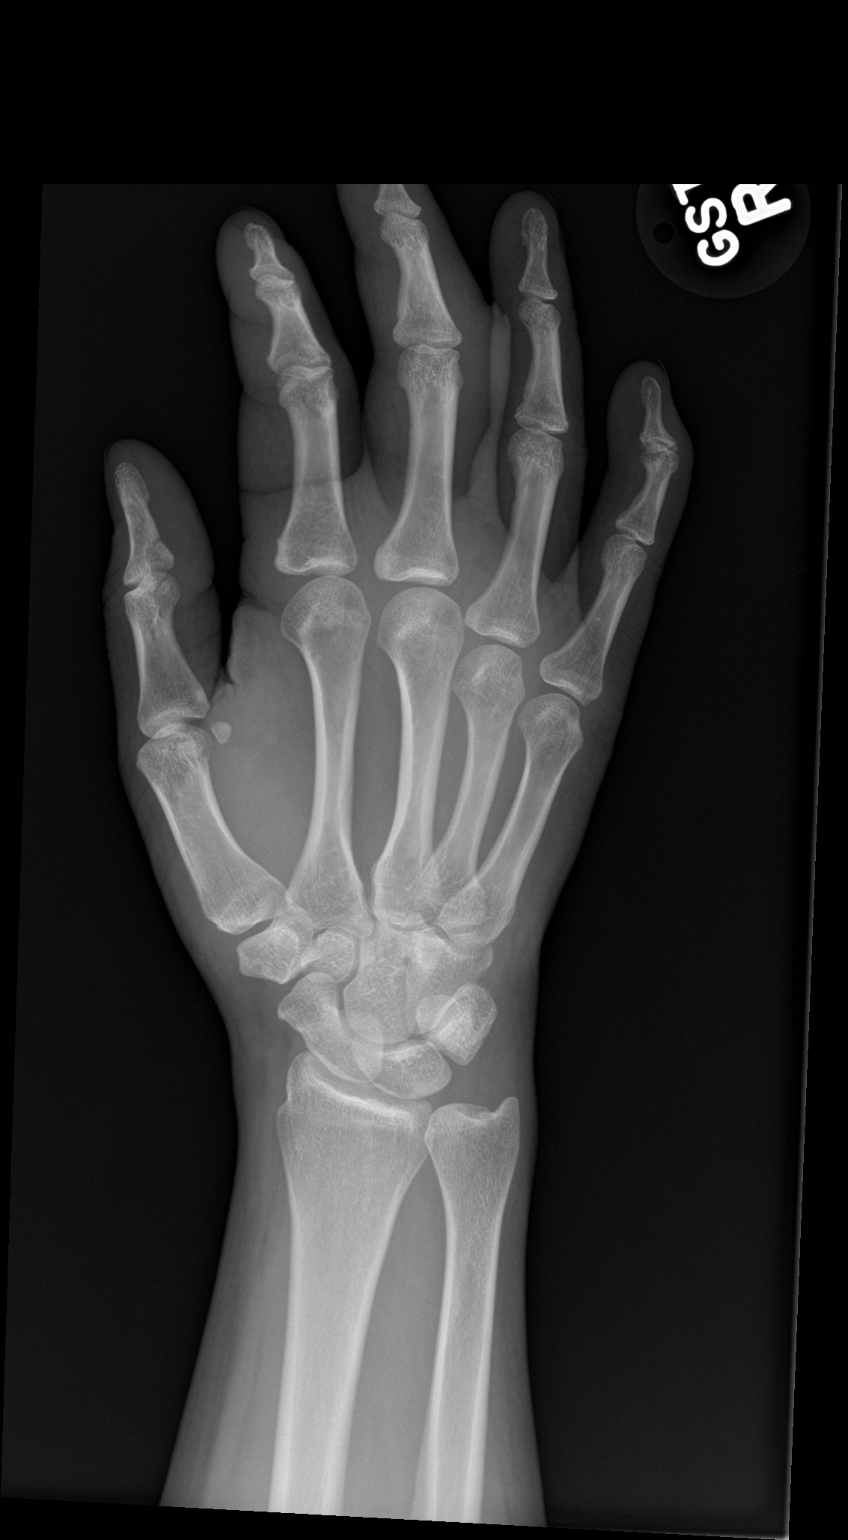

[hand lat]
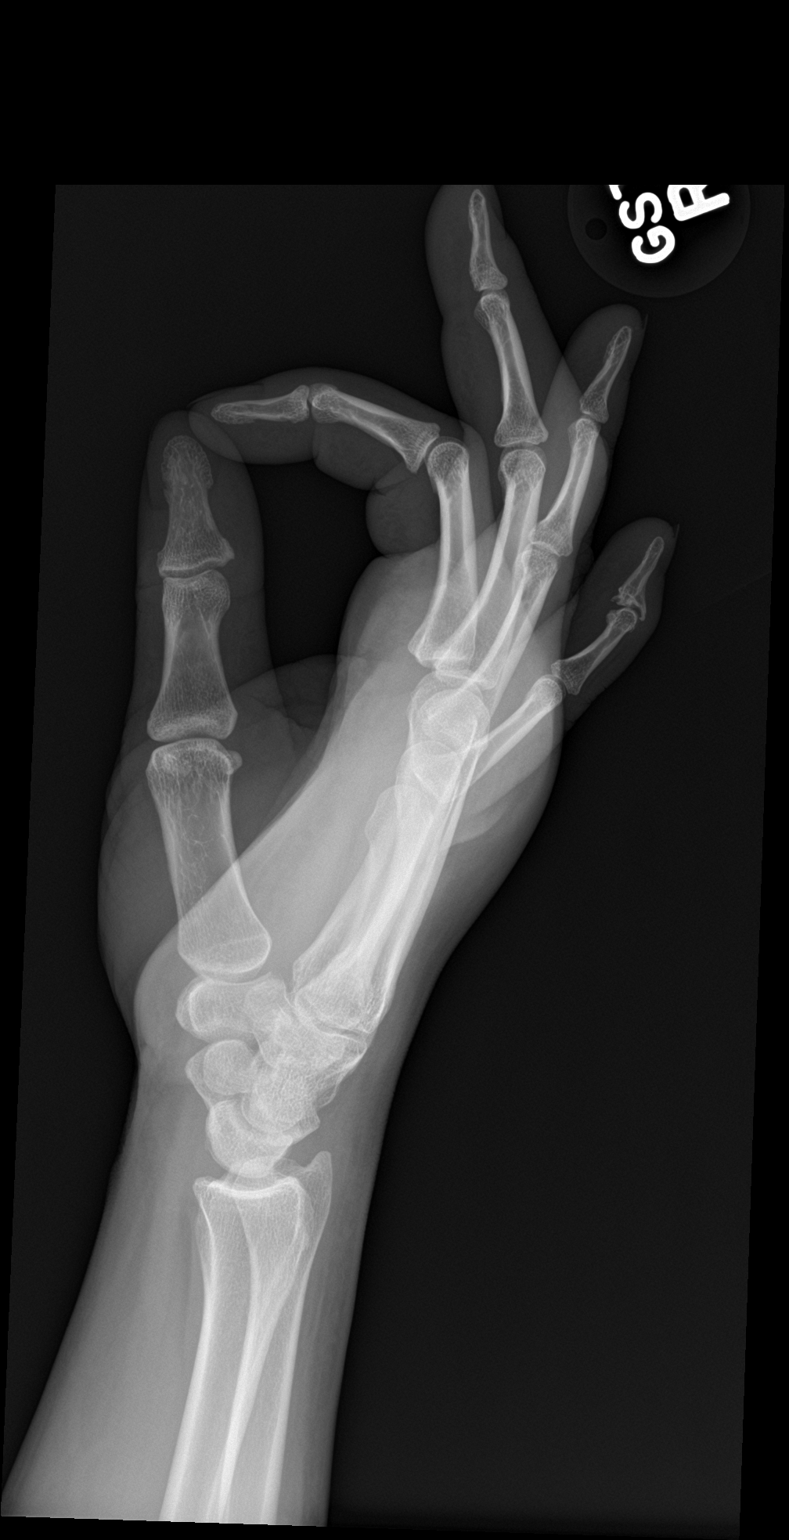

[3 of 3 positions shown; findings below may reference images not displayed]

FINDINGS: There is no evidence of fracture or dislocation. Remote fracture of
the fifth digit distal phalanx. There is no evidence of arthropathy
or other focal bone abnormality. Laceration noted about the middle
finger proximal interphalangeal joint ulnar aspect. Diffuse soft
tissue edema of the digit. No tracking soft tissue air or radiopaque
foreign body.
IMPRESSION: Soft tissue edema of the middle finger with laceration about the
proximal interphalangeal joint. No soft tissue air, radiopaque
foreign body, or acute osseous abnormality.

## 2019-05-26 ENCOUNTER — Other Ambulatory Visit: Payer: Self-pay

## 2019-05-26 ENCOUNTER — Inpatient Hospital Stay (HOSPITAL_COMMUNITY): Payer: Self-pay

## 2019-05-26 ENCOUNTER — Encounter (HOSPITAL_COMMUNITY): Payer: Self-pay

## 2019-05-26 ENCOUNTER — Inpatient Hospital Stay (HOSPITAL_COMMUNITY)
Admission: AD | Admit: 2019-05-26 | Discharge: 2019-05-26 | Disposition: A | Discharge status: Home / Self Care | Payer: Self-pay | Attending: Family Medicine | Admitting: Family Medicine

## 2019-05-26 DIAGNOSIS — O26899 Other specified pregnancy related conditions, unspecified trimester: Secondary | ICD-10-CM

## 2019-05-26 DIAGNOSIS — Z79899 Other long term (current) drug therapy: Secondary | ICD-10-CM | POA: Insufficient documentation

## 2019-05-26 DIAGNOSIS — Z3A12 12 weeks gestation of pregnancy: Secondary | ICD-10-CM | POA: Insufficient documentation

## 2019-05-26 DIAGNOSIS — O26891 Other specified pregnancy related conditions, first trimester: Secondary | ICD-10-CM | POA: Insufficient documentation

## 2019-05-26 DIAGNOSIS — E876 Hypokalemia: Secondary | ICD-10-CM | POA: Insufficient documentation

## 2019-05-26 DIAGNOSIS — O99331 Smoking (tobacco) complicating pregnancy, first trimester: Secondary | ICD-10-CM | POA: Insufficient documentation

## 2019-05-26 DIAGNOSIS — F17210 Nicotine dependence, cigarettes, uncomplicated: Secondary | ICD-10-CM | POA: Insufficient documentation

## 2019-05-26 DIAGNOSIS — R109 Unspecified abdominal pain: Secondary | ICD-10-CM | POA: Insufficient documentation

## 2019-05-26 DIAGNOSIS — F332 Major depressive disorder, recurrent severe without psychotic features: Secondary | ICD-10-CM | POA: Insufficient documentation

## 2019-05-26 DIAGNOSIS — O99011 Anemia complicating pregnancy, first trimester: Secondary | ICD-10-CM | POA: Insufficient documentation

## 2019-05-26 DIAGNOSIS — R102 Pelvic and perineal pain: Secondary | ICD-10-CM | POA: Insufficient documentation

## 2019-05-26 DIAGNOSIS — O3680X0 Pregnancy with inconclusive fetal viability, not applicable or unspecified: Secondary | ICD-10-CM | POA: Insufficient documentation

## 2019-05-26 DIAGNOSIS — D649 Anemia, unspecified: Secondary | ICD-10-CM | POA: Insufficient documentation

## 2019-05-26 DIAGNOSIS — O209 Hemorrhage in early pregnancy, unspecified: Secondary | ICD-10-CM | POA: Insufficient documentation

## 2019-05-26 HISTORY — DX: Anemia, unspecified: D64.9

## 2019-05-26 LAB — CBC
HCT: 37.5 % (ref 36.0–46.0)
Hemoglobin: 12.5 g/dL (ref 12.0–15.0)
MCH: 30.6 pg (ref 26.0–34.0)
MCHC: 33.3 g/dL (ref 30.0–36.0)
MCV: 91.7 fL (ref 80.0–100.0)
Platelets: 274 10*3/uL (ref 150–400)
RBC: 4.09 MIL/uL (ref 3.87–5.11)
RDW: 14.3 % (ref 11.5–15.5)
WBC: 8.2 10*3/uL (ref 4.0–10.5)
nRBC: 0 % (ref 0.0–0.2)

## 2019-05-26 LAB — WET PREP, GENITAL
Clue Cells Wet Prep HPF POC: NONE SEEN
Sperm: NONE SEEN
Trich, Wet Prep: NONE SEEN
WBC, Wet Prep HPF POC: NONE SEEN
Yeast Wet Prep HPF POC: NONE SEEN

## 2019-05-26 LAB — RAPID URINE DRUG SCREEN, HOSP PERFORMED
Amphetamines: NOT DETECTED
Barbiturates: NOT DETECTED
Benzodiazepines: NOT DETECTED
Cocaine: POSITIVE — AB
Opiates: NOT DETECTED
Tetrahydrocannabinol: POSITIVE — AB

## 2019-05-26 LAB — HCG, QUANTITATIVE, PREGNANCY: hCG, Beta Chain, Quant, S: 6949 m[IU]/mL — ABNORMAL HIGH (ref <5)

## 2019-05-26 LAB — POCT PREGNANCY, URINE: Preg Test, Ur: POSITIVE — AB

## 2019-05-26 MED ORDER — OXYCODONE-ACETAMINOPHEN 5-325 MG PO TABS
1.0000 | ORAL_TABLET | Freq: Once | ORAL | Status: AC
  Administered 2019-05-26: 1 via ORAL
  Filled 2019-05-26: qty 1

## 2019-05-26 NOTE — MAU Note (Signed)
Pt reports bleeding "a little bit" yesterday at work. Hx of miscarriage in 2012.  Reports vaginal bleeding.   Pt has not been seen for this pregnancy

## 2019-05-26 NOTE — Discharge Instructions (Signed)
Vaginal Bleeding During Pregnancy, First Trimester ° °A small amount of bleeding (spotting) from the vagina is common during early pregnancy. Sometimes the bleeding is normal and does not cause problems. At other times, though, bleeding may be a sign of something serious. Tell your doctor about any bleeding from your vagina right away. °Follow these instructions at home: °Activity °· Follow your doctor's instructions about how active you can be. °· If needed, make plans for someone to help with your normal activities. °· Do not have sex or orgasms until your doctor says that this is safe. °General instructions °· Take over-the-counter and prescription medicines only as told by your doctor. °· Watch your condition for any changes. °· Write down: °? The number of pads you use each day. °? How often you change pads. °? How soaked (saturated) your pads are. °· Do not use tampons. °· Do not douche. °· If you pass any tissue from your vagina, save it to show to your doctor. °· Keep all follow-up visits as told by your doctor. This is important. °Contact a doctor if: °· You have vaginal bleeding at any time while you are pregnant. °· You have cramps. °· You have a fever. °Get help right away if: °· You have very bad cramps in your back or belly (abdomen). °· You pass large clots or a lot of tissue from your vagina. °· Your bleeding gets worse. °· You feel light-headed. °· You feel weak. °· You pass out (faint). °· You have chills. °· You are leaking fluid from your vagina. °· You have a gush of fluid from your vagina. °Summary °· Sometimes vaginal bleeding during pregnancy is normal and does not cause problems. At other times, bleeding may be a sign of something serious. °· Tell your doctor about any bleeding from your vagina right away. °· Follow your doctor's instructions about how active you can be. You may need someone to help you with your normal activities. °This information is not intended to replace advice given to  you by your health care provider. Make sure you discuss any questions you have with your health care provider. °Document Released: 12/19/2013 Document Revised: 11/23/2018 Document Reviewed: 11/05/2016 °Elsevier Patient Education © 2020 Elsevier Inc. ° °

## 2019-05-26 NOTE — ED Provider Notes (Signed)
[facility]    CSN: [id number] Arrival date & time: 02/17/18  1230      History   Chief Complaint Chief Complaint  Patient presents with  . Sore Throat    HPI Denise Brandt is a 38 y.o. female.   Pt with no chronic medical problems presents to urgent care c/o sore throat x1 day.  She is also concerned that she may have a UTI due to increased frequency     Past Medical History:  Diagnosis Date  . Depression   . Ovarian cyst     Patient Active Problem List   Diagnosis Date Noted  . MDD (major depressive disorder), recurrent severe, without psychosis ([place]) 02/27/2018  . Cellulitis 02/25/2018  . Anemia 02/25/2018  . Hypokalemia 02/25/2018  . Severe recurrent major depressive disorder with psychotic features ([place]) 07/26/2017  . Alcohol use disorder, moderate, dependence ([place]) 07/26/2017  . Suicidal ideation 07/26/2017  . Tobacco use disorder 07/26/2017  . Cocaine use disorder, moderate, dependence ([place]) 07/26/2017  . PTSD (post-traumatic stress disorder) 07/24/2017    Past Surgical History:  Procedure Laterality Date  . CESAREAN SECTION    . I&D EXTREMITY Right 02/25/2018   Procedure: IRRIGATION AND DEBRIDEMENT EXTREMITY;  Surgeon: [name], MD;  Location: [facility];  Service: Orthopedics;  Laterality: Right;    OB History    Gravida  1   Para      Term      Preterm      AB      Living        SAB      TAB      Ectopic      Multiple      Live Births               Home Medications    Prior to Admission medications   Medication Sig Start Date End Date Taking? Authorizing Provider  acyclovir (ZOVIRAX) 400 MG tablet Take 1 tablet (400 mg total) by mouth 4 (four) times daily. 01/21/19   Margarita Mail, PA-C  citalopram (CELEXA) 10 MG tablet Take 1 tablet (10 mg total) by mouth daily. 01/21/19   Margarita Mail, PA-C  hydrOXYzine (ATARAX/VISTARIL) 50 MG tablet Take 1 tablet (50 mg total) by mouth every 6 (six) hours as  needed for anxiety. Patient not taking: Reported on 01/21/2019 03/04/18   Mordecai Maes, NP  OLANZapine (ZYPREXA) 5 MG tablet Take 1 tablet (5 mg total) by mouth at bedtime. 01/21/19   Margarita Mail, PA-C  traZODone (DESYREL) 100 MG tablet Take 1 tablet (100 mg total) by mouth at bedtime and may repeat dose one time if needed. Patient not taking: Reported on 01/21/2019 03/04/18   Mordecai Maes, NP    Family History History reviewed. No pertinent family history.  Social History Social History   Tobacco Use  . Smoking status: Current Every Day Smoker    Types: Cigars  . Smokeless tobacco: Never Used  Substance Use Topics  . Alcohol use: Yes    Alcohol/week: 4.0 standard drinks    Types: 4 Cans of beer per week    Comment: daily  . Drug use: Yes    Types: Marijuana     Allergies   Patient has no known allergies.   Review of Systems Review of Systems  Constitutional: Negative for chills and fever.  HENT: Positive for sore throat. Negative for tinnitus.   Eyes: Negative for redness.  Respiratory: Negative for cough and shortness of breath.  Cardiovascular: Negative for chest pain and palpitations.  Gastrointestinal: Negative for abdominal pain, diarrhea, nausea and vomiting.  Genitourinary: Positive for frequency. Negative for dysuria and urgency.  Musculoskeletal: Negative for myalgias.  Skin: Negative for rash.       No lesions  Neurological: Negative for weakness.  Hematological: Does not bruise/bleed easily.  Psychiatric/Behavioral: Negative for suicidal ideas.     Physical Exam Triage Vital Signs ED Triage Vitals  Enc Vitals Group     BP 02/17/18 1351 (!) 119/92     Pulse Rate 02/17/18 1351 98     Resp 02/17/18 1351 16     Temp 02/17/18 1351 (!) 101.1 F (38.4 C)     Temp Source 02/17/18 1351 Oral     SpO2 02/17/18 1351 99 %     Weight --      Height --      Head Circumference --      Peak Flow --      Pain Score 02/17/18 1358 10     Pain Loc --       Pain Edu? --      Excl. in [place]? --    No data found.  Updated Vital Signs BP (!) 119/92 (BP Location: Right Arm)   Pulse 98   Temp (!) 101.1 F (38.4 C) (Oral)   Resp 16   LMP 01/31/2018   SpO2 99%   Visual Acuity Right Eye Distance:   Left Eye Distance:   Bilateral Distance:    Right Eye Near:   Left Eye Near:    Bilateral Near:     Physical Exam Vitals signs and nursing note reviewed.  Constitutional:      General: She is not in acute distress.    Appearance: She is well-developed.  HENT:     Head: Normocephalic and atraumatic.  Eyes:     General: No scleral icterus.    Conjunctiva/sclera: Conjunctivae normal.     Pupils: Pupils are equal, round, and reactive to light.  Neck:     Musculoskeletal: Normal range of motion and neck supple.     Thyroid: No thyromegaly.     Vascular: No JVD.     Trachea: No tracheal deviation.  Cardiovascular:     Rate and Rhythm: Normal rate and regular rhythm.     Heart sounds: Normal heart sounds. No murmur. No friction rub. No gallop.   Pulmonary:     Effort: Pulmonary effort is normal.     Breath sounds: Normal breath sounds.  Abdominal:     General: Bowel sounds are normal. There is no distension.     Palpations: Abdomen is soft.     Tenderness: There is no abdominal tenderness.  Musculoskeletal: Normal range of motion.  Lymphadenopathy:     Cervical: No cervical adenopathy.  Skin:    General: Skin is warm and dry.  Neurological:     Mental Status: She is alert and oriented to person, place, and time.     Cranial Nerves: No cranial nerve deficit.  Psychiatric:        Behavior: Behavior normal.        Thought Content: Thought content normal.        Judgment: Judgment normal.      UC Treatments / Results  Labs (all labs ordered are listed, but only abnormal results are displayed) Labs Reviewed  POCT RAPID STREP A - Abnormal; Notable for the following components:      Result Value   Streptococcus, Group  A Screen  (Direct) POSITIVE (*)    All other components within normal limits  HIV ANTIBODY (ROUTINE TESTING)  POCT URINALYSIS DIP (DEVICE)  POCT PREGNANCY, URINE  URINE CYTOLOGY ANCILLARY ONLY    EKG   Radiology No results found.  Procedures Procedures (including critical care time)  Medications Ordered in UC Medications  acetaminophen (TYLENOL) tablet 650 mg (650 mg Oral Given 02/17/18 1403)  acetaminophen (TYLENOL) 325 MG tablet (has no administration in time range)    Initial Impression / Assessment and Plan / UC Course  I have reviewed the triage vital signs and the nursing notes.  Pertinent labs & imaging results that were available during my care of the patient were reviewed by me and considered in my medical decision making (see chart for details).     Strep A positive. UA unremarkable Final Clinical Impressions(s) / UC Diagnoses   Final diagnoses:  Strep pharyngitis   Discharge Instructions   None    ED Prescriptions    Medication Sig Dispense Auth. Provider   penicillin v potassium (VEETID) 500 MG tablet Take 1 tablet (500 mg total) by mouth 3 (three) times daily. 30 tablet Harrie Foreman, MD   phenazopyridine (PYRIDIUM) 200 MG tablet Take 1 tablet (200 mg total) by mouth 3 (three) times daily. 6 tablet Harrie Foreman, MD     PDMP not reviewed this encounter.   Harrie Foreman, MD 05/26/19 [phone]

## 2019-05-26 NOTE — MAU Note (Signed)
RN went into MAU room to re-assess pt's pain, pt not in the room. Pt not in MAU bathroom. Provider M. Jimmye Norman, CNM made aware.

## 2019-05-26 NOTE — MAU Provider Note (Signed)
Chief Complaint: Vaginal Bleeding and Abdominal Pain   First Provider Initiated Contact with Patient 05/26/19 2003        SUBJECTIVE HPI: Denise Brandt is a 38 y.o. [id number] at [redacted]w[redacted]d by LMP who presents to maternity admissions reporting vaginal bleeding (sometimes light, sometimes gushing) and uterine cramping.  Cramping just got severe a few minutes ago.  .She denies vaginal itching/burning, urinary symptoms, h/a, dizziness, n/v, or fever/chills.  Denies recent drug use but state "I took a shot (of alcohol) today".  States took Ibuprofen this am  Patient Active Problem List   Diagnosis Date Noted  . MDD (major depressive disorder), recurrent severe, without psychosis ([place]) 02/27/2018  . Cellulitis 02/25/2018  . Anemia 02/25/2018  . Hypokalemia 02/25/2018  . Severe recurrent major depressive disorder with psychotic features ([place]) 07/26/2017  . Alcohol use disorder, moderate, dependence ([place]) 07/26/2017  . Suicidal ideation 07/26/2017  . Tobacco use disorder 07/26/2017  . Cocaine use disorder, moderate, dependence ([place]) 07/26/2017  . PTSD (post-traumatic stress disorder) 07/24/2017   RN Note: Pt reports bleeding "a little bit" yesterday at work. Hx of miscarriage in 2012. Reports vaginal bleeding.  Pt has not been seen for this pregnancy    Past Medical History:  Diagnosis Date  . Anemia   . Depression   . Ovarian cyst    Past Surgical History:  Procedure Laterality Date  . CESAREAN SECTION    . I&D EXTREMITY Right 02/25/2018   Procedure: IRRIGATION AND DEBRIDEMENT EXTREMITY;  Surgeon: [name], MD;  Location: [facility];  Service: Orthopedics;  Laterality: Right;   Social History   Socioeconomic History  . Marital status: Single    Spouse name: Not on file  . Number of children: Not on file  . Years of education: Not on file  . Highest education level: Not on file  Occupational History  . Not on file  Social Needs  . Financial resource strain: Not on file   . Food insecurity    Worry: Not on file    Inability: Not on file  . Transportation needs    Medical: Not on file    Non-medical: Not on file  Tobacco Use  . Smoking status: Current Every Day Smoker    Types: Cigars  . Smokeless tobacco: Never Used  Substance and Sexual Activity  . Alcohol use: Yes    Alcohol/week: 4.0 standard drinks    Types: 4 Cans of beer per week    Comment: daily  . Drug use: Yes    Types: Marijuana  . Sexual activity: Yes    Birth control/protection: None  Lifestyle  . Physical activity    Days per week: Not on file    Minutes per session: Not on file  . Stress: Not on file  Relationships  . Social Herbalist on phone: Not on file    Gets together: Not on file    Attends religious service: Not on file    Active member of club or organization: Not on file    Attends meetings of clubs or organizations: Not on file    Relationship status: Not on file  . Intimate partner violence    Fear of current or ex partner: Not on file    Emotionally abused: Not on file    Physically abused: Not on file    Forced sexual activity: Not on file  Other Topics Concern  . Not on file  Social History Narrative  . Not on file  No current facility-administered medications on file prior to encounter.    Current Outpatient Medications on File Prior to Encounter  Medication Sig Dispense Refill  . acyclovir (ZOVIRAX) 400 MG tablet Take 1 tablet (400 mg total) by mouth 4 (four) times daily. 50 tablet 0  . citalopram (CELEXA) 10 MG tablet Take 1 tablet (10 mg total) by mouth daily. 14 tablet 0  . hydrOXYzine (ATARAX/VISTARIL) 50 MG tablet Take 1 tablet (50 mg total) by mouth every 6 (six) hours as needed for anxiety. (Patient not taking: Reported on 01/21/2019) 30 tablet 0  . OLANZapine (ZYPREXA) 5 MG tablet Take 1 tablet (5 mg total) by mouth at bedtime. 14 tablet 0  . traZODone (DESYREL) 100 MG tablet Take 1 tablet (100 mg total) by mouth at bedtime and may  repeat dose one time if needed. (Patient not taking: Reported on 01/21/2019) 30 tablet 0   No Known Allergies  I have reviewed patient's Past Medical Hx, Surgical Hx, Family Hx, Social Hx, medications and allergies.   ROS:  Review of Systems  Constitutional: Negative for chills and fever.  Respiratory: Negative for shortness of breath.   Gastrointestinal: Positive for abdominal pain. Negative for constipation, diarrhea, nausea and vomiting.  Genitourinary: Positive for pelvic pain and vaginal bleeding.  Neurological: Negative for dizziness.   Review of Systems  Other systems negative   Physical Exam  Physical Exam Patient Vitals for the past 24 hrs:  BP Temp Pulse Resp SpO2  05/26/19 1947 118/84 98.1 F (36.7 C) 61 (!) 24 99 %   Constitutional: Well-developed, well-nourished female in no acute distress.  Cardiovascular: normal rate Respiratory: normal effort GI: Abd soft, non-tender. Pos BS x 4 MS: Extremities nontender, no edema, normal ROM Neurologic: Alert and oriented x 4.  GU: Neg CVAT.  PELVIC EXAM: Cervix pink, visually closed, without lesion, small amount of blood in vault, vaginal walls and external genitalia normal Bimanual exam: Cervix 0/long/high, firm, anterior, neg CMT, uterus tender, nonenlarged, adnexa without tenderness, enlargement, or mass on right, somewhat tender on Left  LAB RESULTS Results for orders placed or performed during the hospital encounter of 05/26/19 (from the past 24 hour(s))  Pregnancy, urine POC     Status: Abnormal   Collection Time: 05/26/19  7:43 PM  Result Value Ref Range   Preg Test, Ur POSITIVE (A) NEGATIVE  Wet prep, genital     Status: None   Collection Time: 05/26/19  8:24 PM   Specimen: Vaginal  Result Value Ref Range   Yeast Wet Prep HPF POC NONE SEEN NONE SEEN   Trich, Wet Prep NONE SEEN NONE SEEN   Clue Cells Wet Prep HPF POC NONE SEEN NONE SEEN   WBC, Wet Prep HPF POC NONE SEEN NONE SEEN   Sperm NONE SEEN   Urine  rapid drug screen (hosp performed)     Status: Abnormal   Collection Time: 05/26/19  8:26 PM  Result Value Ref Range   Opiates NONE DETECTED NONE DETECTED   Cocaine POSITIVE (A) NONE DETECTED   Benzodiazepines NONE DETECTED NONE DETECTED   Amphetamines NONE DETECTED NONE DETECTED   Tetrahydrocannabinol POSITIVE (A) NONE DETECTED   Barbiturates NONE DETECTED NONE DETECTED  CBC     Status: None   Collection Time: 05/26/19  8:57 PM  Result Value Ref Range   WBC 8.2 4.0 - 10.5 K/uL   RBC 4.09 3.87 - 5.11 MIL/uL   Hemoglobin 12.5 12.0 - 15.0 g/dL   HCT 37.5 36.0 - 46.0 %  MCV 91.7 80.0 - 100.0 fL   MCH 30.6 26.0 - 34.0 pg   MCHC 33.3 30.0 - 36.0 g/dL   RDW 14.3 11.5 - 15.5 %   Platelets 274 150 - 400 K/uL   nRBC 0.0 0.0 - 0.2 %  hCG, quantitative, pregnancy     Status: Abnormal   Collection Time: 05/26/19  8:57 PM  Result Value Ref Range   hCG, Beta Chain, Quant, S 6,949 (H) <5 mIU/mL    IMAGING No results found.   MAU Management/MDM: Ordered usual first trimester r/o ectopic labs.   Pelvic exam and cultures done Will check baseline Ultrasound to rule out ectopic.  This bleeding/pain can represent a normal pregnancy with bleeding, spontaneous abortion or even an ectopic which can be life-threatening.  The process as listed above helps to determine which of these is present.  Reviewed results  Discussed with the small GS with only possible FP, cannot rule out ectopic yet.  Recommend repeat HCG in 48 hours.  SAB/Ectopic precautions  ASSESSMENT Pregnancy at  [redacted]w[redacted]d Bleeding in the first trimester Pelvic cramping Pregnancy of unknown location  PLAN Discharge home Plan to repeat HCG level in 48 hours in MAU Will repeat  Ultrasound in about 7-10 days if HCG levels double appropriately  Ectopic precautions   Pt stable at time of discharge. Encouraged to return here or to other Urgent Care/ED if she develops worsening of symptoms, increase in pain, fever, or other  concerning symptoms.    Hansel Feinstein CNM, MSN Certified Nurse-Midwife 05/26/2019  8:04 PM

## 2019-05-27 ENCOUNTER — Inpatient Hospital Stay (HOSPITAL_COMMUNITY): Payer: Self-pay

## 2019-05-27 ENCOUNTER — Inpatient Hospital Stay (HOSPITAL_COMMUNITY)
Admission: AD | Admit: 2019-05-27 | Discharge: 2019-05-27 | Disposition: A | Discharge status: Home / Self Care | Payer: Self-pay | Attending: Family Medicine | Admitting: Family Medicine

## 2019-05-27 DIAGNOSIS — O99341 Other mental disorders complicating pregnancy, first trimester: Secondary | ICD-10-CM | POA: Insufficient documentation

## 2019-05-27 DIAGNOSIS — R109 Unspecified abdominal pain: Secondary | ICD-10-CM | POA: Insufficient documentation

## 2019-05-27 DIAGNOSIS — F329 Major depressive disorder, single episode, unspecified: Secondary | ICD-10-CM | POA: Insufficient documentation

## 2019-05-27 DIAGNOSIS — O99331 Smoking (tobacco) complicating pregnancy, first trimester: Secondary | ICD-10-CM | POA: Insufficient documentation

## 2019-05-27 DIAGNOSIS — Z79899 Other long term (current) drug therapy: Secondary | ICD-10-CM | POA: Insufficient documentation

## 2019-05-27 DIAGNOSIS — Z3A12 12 weeks gestation of pregnancy: Secondary | ICD-10-CM | POA: Insufficient documentation

## 2019-05-27 DIAGNOSIS — O209 Hemorrhage in early pregnancy, unspecified: Secondary | ICD-10-CM | POA: Insufficient documentation

## 2019-05-27 DIAGNOSIS — O039 Complete or unspecified spontaneous abortion without complication: Secondary | ICD-10-CM

## 2019-05-27 DIAGNOSIS — O34219 Maternal care for unspecified type scar from previous cesarean delivery: Secondary | ICD-10-CM | POA: Insufficient documentation

## 2019-05-27 DIAGNOSIS — O99891 Other specified diseases and conditions complicating pregnancy: Secondary | ICD-10-CM | POA: Insufficient documentation

## 2019-05-27 DIAGNOSIS — F17210 Nicotine dependence, cigarettes, uncomplicated: Secondary | ICD-10-CM | POA: Insufficient documentation

## 2019-05-27 MED ORDER — IBUPROFEN 600 MG PO TABS
600.0000 mg | ORAL_TABLET | Freq: Once | ORAL | Status: AC
  Administered 2019-05-27: 600 mg via ORAL
  Filled 2019-05-27: qty 1

## 2019-05-27 MED ORDER — IBUPROFEN 600 MG PO TABS: 600.0000 mg | ORAL_TABLET | Freq: Four times a day (QID) | ORAL | 1 refills | Status: DC | PRN

## 2019-05-27 NOTE — Discharge Instructions (Signed)
Miscarriage °A miscarriage is the loss of an unborn baby (fetus) before the 20th week of pregnancy. Most miscarriages happen during the first 3 months of pregnancy. Sometimes, a miscarriage can happen before a woman knows that she is pregnant. °Having a miscarriage can be an emotional experience. If you have had a miscarriage, talk with your health care provider about any questions you may have about miscarrying, the grieving process, and your plans for future pregnancy. °What are the causes? °A miscarriage may be caused by: °· Problems with the genes or chromosomes of the fetus. These problems make it impossible for the baby to develop normally. They are often the result of random errors that occur early in the development of the baby, and are not passed from parent to child (not inherited). °· Infection of the cervix or uterus. °· Conditions that affect hormone balance in the body. °· Problems with the cervix, such as the cervix opening and thinning before pregnancy is at term (cervical insufficiency). °· Problems with the uterus. These may include: °? A uterus with an abnormal shape. °? Fibroids in the uterus. °? Congenital abnormalities. These are problems that were present at birth. °· Certain medical conditions. °· Smoking, drinking alcohol, or using drugs. °· Injury (trauma). °In many cases, the cause of a miscarriage is not known. °What are the signs or symptoms? °Symptoms of this condition include: °· Vaginal bleeding or spotting, with or without cramps or pain. °· Pain or cramping in the abdomen or lower back. °· Passing fluid, tissue, or blood clots from the vagina. °How is this diagnosed? °This condition may be diagnosed based on: °· A physical exam. °· Ultrasound. °· Blood tests. °· Urine tests. °How is this treated? °Treatment for a miscarriage is sometimes not necessary if you naturally pass all the tissue that was in your uterus. If necessary, this condition may be treated with: °· Dilation and  curettage (D&C). This is a procedure in which the cervix is stretched open and the lining of the uterus (endometrium) is scraped. This is done only if tissue from the fetus or placenta remains in the body (incomplete miscarriage). °· Medicines, such as: °? Antibiotic medicine, to treat infection. °? Medicine to help the body pass any remaining tissue. °? Medicine to reduce (contract) the size of the uterus. These medicines may be given if you have a lot of bleeding. °If you have Rh negative blood and your baby was Rh positive, you will need a shot of a medicine called Rh immunoglobulinto protect your future babies from Rh blood problems. "Rh-negative" and "Rh-positive" refer to whether or not the blood has a specific protein found on the surface of red blood cells (Rh factor). °Follow these instructions at home: °Medicines ° °· Take over-the-counter and prescription medicines only as told by your health care provider. °· If you were prescribed antibiotic medicine, take it as told by your health care provider. Do not stop taking the antibiotic even if you start to feel better. °· Do not take NSAIDs, such as aspirin and ibuprofen, unless they are approved by your health care provider. These medicines can cause bleeding. °Activity °· Rest as directed. Ask your health care provider what activities are safe for you. °· Have someone help with home and family responsibilities during this time. °General instructions °· Keep track of the number of sanitary pads you use each day and how soaked (saturated) they are. Write down this information. °· Monitor the amount of tissue or blood clots that   you pass from your vagina. Save any large amounts of tissue for your health care provider to examine. °· Do not use tampons, douche, or have sex until your health care provider approves. °· To help you and your partner with the process of grieving, talk with your health care provider or seek counseling. °· When you are ready, meet with  your health care provider to discuss any important steps you should take for your health. Also, discuss steps you should take to have a healthy pregnancy in the future. °· Keep all follow-up visits as told by your health care provider. This is important. °Where to find more information °· The American Congress of Obstetricians [employer] Gynecologists: www.acog.org °· U.S. Department of Health and Human Services Office of Women’s Health: www.womenshealth.gov °Contact a health care provider if: °· You have a fever or chills. °· You have a foul smelling vaginal discharge. °· You have more bleeding instead of less. °Get help right away if: °· You have severe cramps or pain in your back or abdomen. °· You pass blood clots or tissue from your vagina that is walnut-sized or larger. °· You soak more than 1 regular sanitary pad in an hour. °· You become light-headed or weak. °· You pass out. °· You have feelings of sadness that take over your thoughts, or you have thoughts of hurting yourself. °Summary °· Most miscarriages happen in the first 3 months of pregnancy. Sometimes miscarriage happens before a woman even knows that she is pregnant. °· Follow your health care provider's instruction for home care. Keep all follow-up appointments. °· To help you and your partner with the process of grieving, talk with your health care provider or seek counseling. °This information is not intended to replace advice given to you by your health care provider. Make sure you discuss any questions you have with your health care provider. °Document Released: 01/28/2001 Document Revised: 11/26/2018 Document Reviewed: 09/09/2016 °Elsevier Patient Education © 2020 [employer] ° °

## 2019-05-27 NOTE — MAU Provider Note (Addendum)
Chief Complaint: Abdominal Pain and Vaginal Bleeding   First Provider Initiated Contact with Patient 05/27/19 0355        SUBJECTIVE HPI: Denise Brandt is a 38 y.o. [id number] at [redacted]w[redacted]d by LMP who presents to maternity admissions reporting persistent cramping and bleeding with clots  Was seen her earlier tonight with threatened AB.  Passed clots and had severe pain at home.  States the one percocet we gave her did not ease her pain.  . She denies vaginal bleeding, vaginal itching/burning, urinary symptoms, h/a, dizziness, n/v, or fever/chills.    RN Note: States her abdominal pain and vaginal bleeding have gotten worse.  Left here around 2330 and tried to take a shower but didn't get any relief.  Also reports passing large clots.  Past Medical History:  Diagnosis Date  . Anemia   . Depression   . Ovarian cyst    Past Surgical History:  Procedure Laterality Date  . CESAREAN SECTION    . I&D EXTREMITY Right 02/25/2018   Procedure: IRRIGATION AND DEBRIDEMENT EXTREMITY;  Surgeon: [name], MD;  Location: [facility];  Service: Orthopedics;  Laterality: Right;   Social History   Socioeconomic History  . Marital status: Single    Spouse name: Not on file  . Number of children: Not on file  . Years of education: Not on file  . Highest education level: Not on file  Occupational History  . Not on file  Social Needs  . Financial resource strain: Not on file  . Food insecurity    Worry: Not on file    Inability: Not on file  . Transportation needs    Medical: Not on file    Non-medical: Not on file  Tobacco Use  . Smoking status: Current Every Day Smoker    Types: Cigars  . Smokeless tobacco: Never Used  Substance and Sexual Activity  . Alcohol use: Yes    Alcohol/week: 4.0 standard drinks    Types: 4 Cans of beer per week    Comment: daily  . Drug use: Yes    Types: Marijuana  . Sexual activity: Yes    Birth control/protection: None  Lifestyle  . Physical activity     Days per week: Not on file    Minutes per session: Not on file  . Stress: Not on file  Relationships  . Social Herbalist on phone: Not on file    Gets together: Not on file    Attends religious service: Not on file    Active member of club or organization: Not on file    Attends meetings of clubs or organizations: Not on file    Relationship status: Not on file  . Intimate partner violence    Fear of current or ex partner: Not on file    Emotionally abused: Not on file    Physically abused: Not on file    Forced sexual activity: Not on file  Other Topics Concern  . Not on file  Social History Narrative  . Not on file   No current facility-administered medications on file prior to encounter.    Current Outpatient Medications on File Prior to Encounter  Medication Sig Dispense Refill  . acyclovir (ZOVIRAX) 400 MG tablet Take 1 tablet (400 mg total) by mouth 4 (four) times daily. 50 tablet 0  . citalopram (CELEXA) 10 MG tablet Take 1 tablet (10 mg total) by mouth daily. 14 tablet 0  . hydrOXYzine (ATARAX/VISTARIL) 50 MG tablet  Take 1 tablet (50 mg total) by mouth every 6 (six) hours as needed for anxiety. (Patient not taking: Reported on 01/21/2019) 30 tablet 0  . OLANZapine (ZYPREXA) 5 MG tablet Take 1 tablet (5 mg total) by mouth at bedtime. 14 tablet 0  . traZODone (DESYREL) 100 MG tablet Take 1 tablet (100 mg total) by mouth at bedtime and may repeat dose one time if needed. (Patient not taking: Reported on 01/21/2019) 30 tablet 0   No Known Allergies  I have reviewed patient's Past Medical Hx, Surgical Hx, Family Hx, Social Hx, medications and allergies.   ROS:  Review of Systems  Constitutional: Negative for chills and fever.  Respiratory: Negative for shortness of breath.   Gastrointestinal: Positive for abdominal pain. Negative for constipation, diarrhea and nausea.   Review of Systems  Other systems negative   Physical Exam  Physical Exam Vitals:    05/27/19 0356 05/27/19 0606  BP: 118/80 116/68  Pulse: (!) 52 61  Resp: 19 17  Temp: 98.1 F (36.7 C)   SpO2: 100%     Constitutional: Well-developed, well-nourished female in no acute distress.  Cardiovascular: normal rate Respiratory: normal effort GI: Abd soft, non-tender. Pos BS x 4 MS: Extremities nontender, no edema, normal ROM Neurologic: Alert and oriented x 4.  GU: Neg CVAT.  PELVIC EXAM: Speculum exam showed large clump of tissue and clot in vault, removed.  No active hemorrhage  LAB RESULTS Results for orders placed or performed during the hospital encounter of 05/26/19 (from the past 24 hour(s))  Pregnancy, urine POC     Status: Abnormal   Collection Time: 05/26/19  7:43 PM  Result Value Ref Range   Preg Test, Ur POSITIVE (A) NEGATIVE  Wet prep, genital     Status: None   Collection Time: 05/26/19  8:24 PM   Specimen: Vaginal  Result Value Ref Range   Yeast Wet Prep HPF POC NONE SEEN NONE SEEN   Trich, Wet Prep NONE SEEN NONE SEEN   Clue Cells Wet Prep HPF POC NONE SEEN NONE SEEN   WBC, Wet Prep HPF POC NONE SEEN NONE SEEN   Sperm NONE SEEN   Urine rapid drug screen (hosp performed)     Status: Abnormal   Collection Time: 05/26/19  8:26 PM  Result Value Ref Range   Opiates NONE DETECTED NONE DETECTED   Cocaine POSITIVE (A) NONE DETECTED   Benzodiazepines NONE DETECTED NONE DETECTED   Amphetamines NONE DETECTED NONE DETECTED   Tetrahydrocannabinol POSITIVE (A) NONE DETECTED   Barbiturates NONE DETECTED NONE DETECTED  CBC     Status: None   Collection Time: 05/26/19  8:57 PM  Result Value Ref Range   WBC 8.2 4.0 - 10.5 K/uL   RBC 4.09 3.87 - 5.11 MIL/uL   Hemoglobin 12.5 12.0 - 15.0 g/dL   HCT 37.5 36.0 - 46.0 %   MCV 91.7 80.0 - 100.0 fL   MCH 30.6 26.0 - 34.0 pg   MCHC 33.3 30.0 - 36.0 g/dL   RDW 14.3 11.5 - 15.5 %   Platelets 274 150 - 400 K/uL   nRBC 0.0 0.0 - 0.2 %  hCG, quantitative, pregnancy     Status: Abnormal   Collection Time: 05/26/19   8:57 PM  Result Value Ref Range   hCG, Beta Chain, Quant, S 6,949 (H) <5 mIU/mL    IMAGING US Ob Comp Less 14 Wks  Result Date: 05/26/2019 CLINICAL DATA:  Bleeding and pain EXAM: OBSTETRIC <14 WK  [place] AND TRANSVAGINAL OB US TECHNIQUE: Both transabdominal and transvaginal ultrasound examinations were performed for complete evaluation of the gestation as well as the maternal uterus, adnexal regions, and pelvic cul-de-sac. Transvaginal technique was performed to assess early pregnancy. COMPARISON:  None. FINDINGS: Intrauterine gestational sac: Single Yolk sac:  Not Visualized. Embryo: Possible Cardiac Activity: Not Visualized. CRL:  3.3 mm   5 w   6 d                  [place] EDC: 01/20/2020 Subchorionic hemorrhage:  None visualized. Maternal uterus/adnexae: There is a posterior fundal intramural uterine fibroid noted measuring 3.4 x 2.5 x 3.0 cm. A probable corpus luteum cyst seen within the left ovary. The right ovary is unremarkable. IMPRESSION: Probable early intrauterine gestational sac and possible fetal pole, but no yolk sac or cardiac activity yet visualized. Recommend follow-up quantitative B-HCG levels and follow-up US in 14 days to assess viability. This recommendation follows SRU consensus guidelines: Diagnostic Criteria for Nonviable Pregnancy Early in the First Trimester. [place] Med [id number]. Electronically Signed   By: Prudencio Pair M.D.   On: 05/26/2019 23:17   US Ob Transvaginal  Result Date: 05/27/2019 CLINICAL DATA:  Spontaneous abortion.  Beta HCG nearly 7,000 EXAM: TRANSVAGINAL OB ULTRASOUND TECHNIQUE: Transvaginal ultrasound was performed for complete evaluation of the gestation as well as the maternal uterus, adnexal regions, and pelvic cul-de-sac. COMPARISON:  Yesterday FINDINGS: Sac-like structure seen on prior has evolved and now only measures 3 mm in mean sac diameter. An internal fetal pole is no longer suggested. Hypoechoic intramural mass posteriorly at the body is  consistent with fibroid. The endometrium is thickened to 14 mm. No adnexal mass or free pelvic fluid. The ovaries are not discretely visualized on this study. IMPRESSION: Small sac-like structure has decreased from scan a few hours prior and internal contents are no longer seen. In this patient with clinical dates of 12 weeks 2 days, findings are supportive of history of spontaneous abortion. Electronically Signed   By: Monte Fantasia M.D.   On: 05/27/2019 04:55   US Ob Transvaginal  Result Date: 05/26/2019 CLINICAL DATA:  Bleeding and pain EXAM: OBSTETRIC <14 WK [place] AND TRANSVAGINAL OB US TECHNIQUE: Both transabdominal and transvaginal ultrasound examinations were performed for complete evaluation of the gestation as well as the maternal uterus, adnexal regions, and pelvic cul-de-sac. Transvaginal technique was performed to assess early pregnancy. COMPARISON:  None. FINDINGS: Intrauterine gestational sac: Single Yolk sac:  Not Visualized. Embryo: Possible Cardiac Activity: Not Visualized. CRL:  3.3 mm   5 w   6 d                  [place] EDC: 01/20/2020 Subchorionic hemorrhage:  None visualized. Maternal uterus/adnexae: There is a posterior fundal intramural uterine fibroid noted measuring 3.4 x 2.5 x 3.0 cm. A probable corpus luteum cyst seen within the left ovary. The right ovary is unremarkable. IMPRESSION: Probable early intrauterine gestational sac and possible fetal pole, but no yolk sac or cardiac activity yet visualized. Recommend follow-up quantitative B-HCG levels and follow-up US in 14 days to assess viability. This recommendation follows SRU consensus guidelines: Diagnostic Criteria for Nonviable Pregnancy Early in the First Trimester. [place] Med [id number]. Electronically Signed   By: Prudencio Pair M.D.   On: 05/26/2019 23:17     MAU Management/MDM: Ordered repeat US to evaluate after large clump of tissue passed US showed smaller "Gestational Sac" and some blood in  uterus Given passage  of tissue with resultant relief of cramping, it appears that this was completion of SAB Recommend repeat HCG next week to confirm completion, message sent  ASSESSMENT Pregnancy at [redacted]w[redacted]d Bleeding and severe cramping Probable complete Spontaneous Abortion Recent Cocaine and THC use.   PLAN Discharge home Plan to repeat HCG level in clinic next week     Ectopic/SAB precautions Rx Ibuprofen for pain relief at home  Pt stable at time of discharge. Encouraged to return here or to other Urgent Care/ED if she develops worsening of symptoms, increase in pain, fever, or other concerning symptoms.    Hansel Feinstein CNM, MSN Certified Nurse-Midwife 05/27/2019  3:55 AM

## 2019-05-27 NOTE — MAU Note (Signed)
States her abdominal pain and vaginal bleeding have gotten worse.  Left here around 2330 and tried to take a shower but didn't get any relief.  Also reports passing large clots.

## 2019-06-06 ENCOUNTER — Other Ambulatory Visit: Payer: Self-pay | Admitting: Emergency Medicine

## 2019-06-06 ENCOUNTER — Other Ambulatory Visit: Payer: Self-pay

## 2019-06-06 DIAGNOSIS — O039 Complete or unspecified spontaneous abortion without complication: Secondary | ICD-10-CM

## 2019-06-08 LAB — GC/CHLAMYDIA PROBE AMP (~~LOC~~) NOT AT ARMC
Chlamydia: NEGATIVE
Comment: NEGATIVE
Comment: NORMAL
Neisseria Gonorrhea: NEGATIVE

## 2019-07-19 ENCOUNTER — Emergency Department (HOSPITAL_COMMUNITY)
Admission: EM | Admit: 2019-07-19 | Discharge: 2019-07-19 | Disposition: A | Discharge status: Home / Self Care | Payer: Self-pay | Attending: Emergency Medicine | Admitting: Emergency Medicine

## 2019-07-19 ENCOUNTER — Other Ambulatory Visit: Payer: Self-pay

## 2019-07-19 ENCOUNTER — Encounter (HOSPITAL_COMMUNITY): Payer: Self-pay | Admitting: Emergency Medicine

## 2019-07-19 DIAGNOSIS — K644 Residual hemorrhoidal skin tags: Secondary | ICD-10-CM

## 2019-07-19 DIAGNOSIS — K649 Unspecified hemorrhoids: Secondary | ICD-10-CM

## 2019-07-19 DIAGNOSIS — Z79899 Other long term (current) drug therapy: Secondary | ICD-10-CM | POA: Insufficient documentation

## 2019-07-19 DIAGNOSIS — F17290 Nicotine dependence, other tobacco product, uncomplicated: Secondary | ICD-10-CM | POA: Insufficient documentation

## 2019-07-19 DIAGNOSIS — K645 Perianal venous thrombosis: Secondary | ICD-10-CM | POA: Insufficient documentation

## 2019-07-19 LAB — TYPE AND SCREEN
ABO/RH(D): A POS
Antibody Screen: NEGATIVE

## 2019-07-19 LAB — I-STAT BETA HCG BLOOD, ED (MC, WL, AP ONLY): I-stat hCG, quantitative: <5 m[IU]/mL (ref <5)

## 2019-07-19 LAB — COMPREHENSIVE METABOLIC PANEL
ALT: 20 U/L (ref 0–44)
AST: 21 U/L (ref 15–41)
Albumin: 4.4 g/dL (ref 3.5–5.0)
Alkaline Phosphatase: 76 U/L (ref 38–126)
Anion gap: 13 (ref 5–15)
BUN: 8 mg/dL (ref 6–20)
CO2: 24 mmol/L (ref 22–32)
Calcium: 9.3 mg/dL (ref 8.9–10.3)
Chloride: 98 mmol/L (ref 98–111)
Creatinine, Ser: 0.88 mg/dL (ref 0.44–1.00)
GFR calc Af Amer: >60 mL/min (ref >60)
GFR calc non Af Amer: >60 mL/min (ref >60)
Glucose, Bld: 83 mg/dL (ref 70–99)
Potassium: 3.7 mmol/L (ref 3.5–5.1)
Sodium: 135 mmol/L (ref 135–145)
Total Bilirubin: 1.2 mg/dL (ref 0.3–1.2)
Total Protein: 7.8 g/dL (ref 6.5–8.1)

## 2019-07-19 LAB — CBC
HCT: 41.8 % (ref 36.0–46.0)
Hemoglobin: 13.6 g/dL (ref 12.0–15.0)
MCH: 30.9 pg (ref 26.0–34.0)
MCHC: 32.5 g/dL (ref 30.0–36.0)
MCV: 95 fL (ref 80.0–100.0)
Platelets: 288 10*3/uL (ref 150–400)
RBC: 4.4 MIL/uL (ref 3.87–5.11)
RDW: 15.1 % (ref 11.5–15.5)
WBC: 8 10*3/uL (ref 4.0–10.5)
nRBC: 0 % (ref 0.0–0.2)

## 2019-07-19 MED ORDER — DIBUCAINE (PERIANAL) 1 % EX OINT
TOPICAL_OINTMENT | Freq: Once | CUTANEOUS | Status: AC
  Administered 2019-07-19: 22:00:00 via RECTAL
  Filled 2019-07-19: qty 28

## 2019-07-19 MED ORDER — DIBUCAINE (PERIANAL) 1 % EX OINT
TOPICAL_OINTMENT | CUTANEOUS | Status: DC | PRN
  Filled 2019-07-19: qty 28

## 2019-07-19 MED ORDER — PHENYLEPHRINE IN HARD FAT 0.25 % RE SUPP: 1.0000 | Freq: Two times a day (BID) | RECTAL | 0 refills | Status: DC

## 2019-07-19 NOTE — ED Triage Notes (Signed)
Pt has been dx with hemorrhoids a few years ago, states they bleed occasionally, but today bleeding and pain has increased to the point where she needs a pad.

## 2019-07-19 NOTE — Discharge Instructions (Addendum)
Please return to the emergency department if you have are soaking through 1 overnight pad hourly, pass out, become very short of breath or weak.

## 2019-07-19 NOTE — ED Provider Notes (Signed)
[facility] EMERGENCY DEPARTMENT Provider Note   CSN: [id number] Arrival date & time: 07/19/19  1502     History   Chief Complaint Chief Complaint  Patient presents with  . Hemorrhoids    HPI Denise Brandt is a 38 y.o. female with a past medical history of previous hemorrhoids who presents emergency department with hematochezia and external hemorrhoid.  Patient states that her symptoms began yesterday.  She had onset of pain around her rectum with a palpable thrombosed hemorrhoid.  She has had some bleeding from her bottom which is bright red.  She has soaked 2 pads today with her bleeding.  She denies any internal rectal pain.  She denies lightheadedness or shortness of breath.     HPI  Past Medical History:  Diagnosis Date  . Anemia   . Depression   . Ovarian cyst     Patient Active Problem List   Diagnosis Date Noted  . MDD (major depressive disorder), recurrent severe, without psychosis ([place]) 02/27/2018  . Cellulitis 02/25/2018  . Anemia 02/25/2018  . Hypokalemia 02/25/2018  . Severe recurrent major depressive disorder with psychotic features ([place]) 07/26/2017  . Alcohol use disorder, moderate, dependence ([place]) 07/26/2017  . Suicidal ideation 07/26/2017  . Tobacco use disorder 07/26/2017  . Cocaine use disorder, moderate, dependence ([place]) 07/26/2017  . PTSD (post-traumatic stress disorder) 07/24/2017    Past Surgical History:  Procedure Laterality Date  . CESAREAN SECTION    . I&D EXTREMITY Right 02/25/2018   Procedure: IRRIGATION AND DEBRIDEMENT EXTREMITY;  Surgeon: [name], MD;  Location: [facility];  Service: Orthopedics;  Laterality: Right;     OB History    Gravida  8   Para  2   Term  2   Preterm      AB  5   Living  2     SAB  3   TAB  2   Ectopic      Multiple      Live Births  2            Home Medications    Prior to Admission medications   Medication Sig Start Date End Date Taking?  Authorizing Provider  acyclovir (ZOVIRAX) 400 MG tablet Take 1 tablet (400 mg total) by mouth 4 (four) times daily. 01/21/19   Margarita Mail, PA-C  citalopram (CELEXA) 10 MG tablet Take 1 tablet (10 mg total) by mouth daily. 01/21/19   Margarita Mail, PA-C  hydrOXYzine (ATARAX/VISTARIL) 50 MG tablet Take 1 tablet (50 mg total) by mouth every 6 (six) hours as needed for anxiety. Patient not taking: Reported on 01/21/2019 03/04/18   Mordecai Maes, NP  ibuprofen (ADVIL) 600 MG tablet Take 1 tablet (600 mg total) by mouth every 6 (six) hours as needed. 05/27/19   Seabron Spates, CNM  OLANZapine (ZYPREXA) 5 MG tablet Take 1 tablet (5 mg total) by mouth at bedtime. 01/21/19   Margarita Mail, PA-C  traZODone (DESYREL) 100 MG tablet Take 1 tablet (100 mg total) by mouth at bedtime and may repeat dose one time if needed. Patient not taking: Reported on 01/21/2019 03/04/18   Mordecai Maes, NP    Family History No family history on file.  Social History Social History   Tobacco Use  . Smoking status: Current Every Day Smoker    Types: Cigars  . Smokeless tobacco: Never Used  Substance Use Topics  . Alcohol use: Yes    Alcohol/week: 4.0 standard drinks    Types:  4 Cans of beer per week    Comment: daily  . Drug use: Yes    Types: Marijuana     Allergies   Patient has no known allergies.   Review of Systems Review of Systems Ten systems reviewed and are negative for acute change, except as noted in the HPI.    Physical Exam Updated Vital Signs BP 127/77 (BP Location: Right Arm)   Pulse 79   Temp 98.7 F (37.1 C) (Oral)   Resp 16   LMP 02/19/2019 (Exact Date)   SpO2 99%   Breastfeeding Unknown   Physical Exam Vitals signs and nursing note reviewed.  Constitutional:      General: She is not in acute distress.    Appearance: She is well-developed. She is not diaphoretic.  HENT:     Head: Normocephalic and atraumatic.  Eyes:     General: No scleral icterus.     Conjunctiva/sclera: Conjunctivae normal.  Neck:     Musculoskeletal: Normal range of motion.  Cardiovascular:     Rate and Rhythm: Normal rate and regular rhythm.     Heart sounds: Normal heart sounds. No murmur. No friction rub. No gallop.   Pulmonary:     Effort: Pulmonary effort is normal. No respiratory distress.     Breath sounds: Normal breath sounds.  Abdominal:     General: Bowel sounds are normal. There is no distension.     Palpations: Abdomen is soft. There is no mass.     Tenderness: There is no abdominal tenderness. There is no guarding.  Genitourinary:      Comments:  Digital Rectal Exam reveals sphincter with good tone. No external hemorrhoids. No masses or fissures. Stool color is brown with no overt blood.  Skin:    General: Skin is warm and dry.  Neurological:     Mental Status: She is alert and oriented to person, place, and time.  Psychiatric:        Behavior: Behavior normal.      ED Treatments / Results  Labs (all labs ordered are listed, but only abnormal results are displayed) Labs Reviewed  COMPREHENSIVE METABOLIC PANEL  CBC  I-STAT BETA HCG BLOOD, ED ([facility], [facility], [facility] ONLY)  TYPE AND SCREEN    EKG None  Radiology No results found.  Procedures Procedures (including critical care time)  Medications Ordered in ED Medications  dibucaine (NUPERCAINAL) 1 % rectal ointment (has no administration in time range)  dibucaine (NUPERCAINAL) 1 % rectal ointment (has no administration in time range)     Initial Impression / Assessment and Plan / ED Course  I have reviewed the triage vital signs and the nursing notes.  Pertinent labs & imaging results that were available during my care of the patient were reviewed by me and considered in my medical decision making (see chart for details).       Here with small thrombosed external hemorrhoid.  She has no active bleeding on rectal examination.  I personally reviewed the patient's labs which show  negative pregnancy, CBC shows no abnormality including anemia or elevated white blood cell count.  CMP without abnormality.  Type and screen shows a positive blood without antibodies.  The differential diagnosis for lower GI bleed includes but is not limited to high flow upper GI bleed, diverticulosis or radiculitis, vascular ectasia/arteriovenous malformation, inflammatory bowel disease, infectious colitis, mesenteric ischemia or ischemic colitis, Meckel's diverticulum, colorectal cancer or polyps, internal hemorrhoids, aortoenteric fistula, rectal foreign body, rectal ulceration or anal  fissure.' No evidence of rectal fissures, she has no active bleeding on examination and she is hemodynamically stable without hypotension.  Will be discharged with dibucaine ointment, home supportive care and preparation H suppository.  [facility] outpatient follow-up and return precautions.  She was appropriate for discharge   Final Clinical Impressions(s) / ED Diagnoses   Final diagnoses:  None    ED Discharge Orders    None       Margarita Mail, PA-C 07/19/19 2129    Little, Wenda Overland, MD 07/19/19 2340

## 2019-07-19 NOTE — ED Notes (Signed)
Called pt name for room x3 no response

## 2019-12-17 ENCOUNTER — Other Ambulatory Visit: Payer: Self-pay

## 2019-12-17 ENCOUNTER — Emergency Department (HOSPITAL_COMMUNITY)
Admission: EM | Admit: 2019-12-17 | Discharge: 2019-12-17 | Disposition: A | Discharge status: Home / Self Care | Payer: Self-pay | Attending: Emergency Medicine | Admitting: Emergency Medicine

## 2019-12-17 ENCOUNTER — Encounter (HOSPITAL_COMMUNITY): Payer: Self-pay | Admitting: Emergency Medicine

## 2019-12-17 DIAGNOSIS — O26891 Other specified pregnancy related conditions, first trimester: Secondary | ICD-10-CM | POA: Insufficient documentation

## 2019-12-17 DIAGNOSIS — R1031 Right lower quadrant pain: Secondary | ICD-10-CM | POA: Insufficient documentation

## 2019-12-17 DIAGNOSIS — Z79899 Other long term (current) drug therapy: Secondary | ICD-10-CM | POA: Insufficient documentation

## 2019-12-17 DIAGNOSIS — O2241 Hemorrhoids in pregnancy, first trimester: Secondary | ICD-10-CM | POA: Insufficient documentation

## 2019-12-17 DIAGNOSIS — R1030 Lower abdominal pain, unspecified: Secondary | ICD-10-CM | POA: Insufficient documentation

## 2019-12-17 DIAGNOSIS — Z3491 Encounter for supervision of normal pregnancy, unspecified, first trimester: Secondary | ICD-10-CM | POA: Insufficient documentation

## 2019-12-17 DIAGNOSIS — O99321 Drug use complicating pregnancy, first trimester: Secondary | ICD-10-CM | POA: Insufficient documentation

## 2019-12-17 DIAGNOSIS — R1032 Left lower quadrant pain: Secondary | ICD-10-CM | POA: Insufficient documentation

## 2019-12-17 DIAGNOSIS — K649 Unspecified hemorrhoids: Secondary | ICD-10-CM

## 2019-12-17 DIAGNOSIS — Z3A08 8 weeks gestation of pregnancy: Secondary | ICD-10-CM | POA: Insufficient documentation

## 2019-12-17 DIAGNOSIS — F1210 Cannabis abuse, uncomplicated: Secondary | ICD-10-CM | POA: Insufficient documentation

## 2019-12-17 DIAGNOSIS — F17290 Nicotine dependence, other tobacco product, uncomplicated: Secondary | ICD-10-CM | POA: Insufficient documentation

## 2019-12-17 DIAGNOSIS — O99331 Smoking (tobacco) complicating pregnancy, first trimester: Secondary | ICD-10-CM | POA: Insufficient documentation

## 2019-12-17 LAB — COMPREHENSIVE METABOLIC PANEL
ALT: 25 U/L (ref 0–44)
AST: 33 U/L (ref 15–41)
Albumin: 4.2 g/dL (ref 3.5–5.0)
Alkaline Phosphatase: 68 U/L (ref 38–126)
Anion gap: 11 (ref 5–15)
BUN: 7 mg/dL (ref 6–20)
CO2: 24 mmol/L (ref 22–32)
Calcium: 8.8 mg/dL — ABNORMAL LOW (ref 8.9–10.3)
Chloride: 101 mmol/L (ref 98–111)
Creatinine, Ser: 0.71 mg/dL (ref 0.44–1.00)
GFR calc Af Amer: >60 mL/min (ref >60)
GFR calc non Af Amer: >60 mL/min (ref >60)
Glucose, Bld: 86 mg/dL (ref 70–99)
Potassium: 3.7 mmol/L (ref 3.5–5.1)
Sodium: 136 mmol/L (ref 135–145)
Total Bilirubin: 1.2 mg/dL (ref 0.3–1.2)
Total Protein: 7.6 g/dL (ref 6.5–8.1)

## 2019-12-17 LAB — CBC
HCT: 37.4 % (ref 36.0–46.0)
Hemoglobin: 12.7 g/dL (ref 12.0–15.0)
MCH: 31.4 pg (ref 26.0–34.0)
MCHC: 34 g/dL (ref 30.0–36.0)
MCV: 92.6 fL (ref 80.0–100.0)
Platelets: 299 10*3/uL (ref 150–400)
RBC: 4.04 MIL/uL (ref 3.87–5.11)
RDW: 14.4 % (ref 11.5–15.5)
WBC: 8.6 10*3/uL (ref 4.0–10.5)
nRBC: 0 % (ref 0.0–0.2)

## 2019-12-17 LAB — WET PREP, GENITAL
Sperm: NONE SEEN
Trich, Wet Prep: NONE SEEN
Yeast Wet Prep HPF POC: NONE SEEN

## 2019-12-17 LAB — URINALYSIS, ROUTINE W REFLEX MICROSCOPIC
Bilirubin Urine: NEGATIVE
Glucose, UA: NEGATIVE mg/dL
Hgb urine dipstick: NEGATIVE
Ketones, ur: NEGATIVE mg/dL
Leukocytes,Ua: NEGATIVE
Nitrite: NEGATIVE
Protein, ur: NEGATIVE mg/dL
Specific Gravity, Urine: 1.014 (ref 1.005–1.030)
pH: 8 (ref 5.0–8.0)

## 2019-12-17 LAB — I-STAT BETA HCG BLOOD, ED (MC, WL, AP ONLY): I-stat hCG, quantitative: >2000 m[IU]/mL — ABNORMAL HIGH (ref <5)

## 2019-12-17 LAB — LIPASE, BLOOD: Lipase: 19 U/L (ref 11–51)

## 2019-12-17 MED ORDER — HYDROCORTISONE ACETATE 25 MG RE SUPP
25.0000 mg | Freq: Two times a day (BID) | RECTAL | 0 refills | Status: DC
Start: 2019-12-17 — End: 2021-09-10

## 2019-12-17 MED ORDER — LIDOCAINE HCL URETHRAL/MUCOSAL 2 % EX GEL
1.0000 "application " | Freq: Once | CUTANEOUS | Status: AC
  Administered 2019-12-17: 1
  Filled 2019-12-17: qty 30

## 2019-12-17 MED ORDER — ACETAMINOPHEN 500 MG PO TABS
1000.0000 mg | ORAL_TABLET | Freq: Once | ORAL | Status: AC
  Administered 2019-12-17: 1000 mg via ORAL
  Filled 2019-12-17: qty 2

## 2019-12-17 MED ORDER — LIDOCAINE 5 % EX OINT
1.0000 | TOPICAL_OINTMENT | CUTANEOUS | 0 refills | Status: DC | PRN
Start: 2019-12-17 — End: 2021-09-10

## 2019-12-17 MED ORDER — SODIUM CHLORIDE 0.9% FLUSH: 3.0000 mL | Freq: Once | INTRAVENOUS | Status: DC

## 2019-12-17 NOTE — ED Provider Notes (Signed)
[facility] DEPT Provider Note   CSN: [id number] Arrival date & time: 12/17/19  1312     History Chief Complaint  Patient presents with  . Abdominal Pain  . Hemorrhoids    Denise Brandt is a 39 y.o. female.  39 yo F with a chief complaints of hemorrhoids.  She also has been having lower abdominal pain and back pressure.  Has been going on for a few days.  She has been seen for hemorrhoids in the past and they have gotten worse over the past week or so.  She feels that her hemorrhoids are bleeding slightly.  She denies vaginal bleeding or discharge.  Think she may be pregnant because she has not had a menstrual cycle in a couple months.   The history is provided by the patient.  Abdominal Pain Pain location:  Suprapubic Pain quality: aching and cramping   Pain radiates to:  Does not radiate Pain severity:  Moderate Onset quality:  Gradual Duration:  2 days Timing:  Constant Progression:  Worsening Chronicity:  New Relieved by:  Nothing Worsened by:  Nothing Ineffective treatments:  None tried Associated symptoms: no chest pain, no chills, no dysuria, no fever, no nausea, no shortness of breath and no vomiting        Past Medical History:  Diagnosis Date  . Anemia   . Depression   . Ovarian cyst     Patient Active Problem List   Diagnosis Date Noted  . MDD (major depressive disorder), recurrent severe, without psychosis ([place]) 02/27/2018  . Cellulitis 02/25/2018  . Anemia 02/25/2018  . Hypokalemia 02/25/2018  . Severe recurrent major depressive disorder with psychotic features ([place]) 07/26/2017  . Alcohol use disorder, moderate, dependence ([place]) 07/26/2017  . Suicidal ideation 07/26/2017  . Tobacco use disorder 07/26/2017  . Cocaine use disorder, moderate, dependence ([place]) 07/26/2017  . PTSD (post-traumatic stress disorder) 07/24/2017    Past Surgical History:  Procedure Laterality Date  . CESAREAN SECTION    . I & D  EXTREMITY Right 02/25/2018   Procedure: IRRIGATION AND DEBRIDEMENT EXTREMITY;  Surgeon: [name], MD;  Location: [facility];  Service: Orthopedics;  Laterality: Right;     OB History    Gravida  8   Para  2   Term  2   Preterm      AB  5   Living  2     SAB  3   TAB  2   Ectopic      Multiple      Live Births  2           No family history on file.  Social History   Tobacco Use  . Smoking status: Current Every Day Smoker    Types: Cigars  . Smokeless tobacco: Never Used  Substance Use Topics  . Alcohol use: Yes    Alcohol/week: 4.0 standard drinks    Types: 4 Cans of beer per week    Comment: daily  . Drug use: Yes    Types: Marijuana    Home Medications Prior to Admission medications   Medication Sig Start Date End Date Taking? Authorizing Provider  acyclovir (ZOVIRAX) 400 MG tablet Take 1 tablet (400 mg total) by mouth 4 (four) times daily. 01/21/19   Margarita Mail, PA-C  citalopram (CELEXA) 10 MG tablet Take 1 tablet (10 mg total) by mouth daily. 01/21/19   Margarita Mail, PA-C  hydrocortisone (ANUSOL-HC) 25 MG suppository Place 1 suppository (25 mg total)  rectally 2 (two) times daily. For 7 days 12/17/19   Deno Etienne, DO  hydrOXYzine (ATARAX/VISTARIL) 50 MG tablet Take 1 tablet (50 mg total) by mouth every 6 (six) hours as needed for anxiety. Patient not taking: Reported on 01/21/2019 03/04/18   Mordecai Maes, NP  ibuprofen (ADVIL) 600 MG tablet Take 1 tablet (600 mg total) by mouth every 6 (six) hours as needed. 05/27/19   Seabron Spates, CNM  lidocaine (XYLOCAINE) 5 % ointment Apply 1 application topically as needed. 12/17/19   Deno Etienne, DO  OLANZapine (ZYPREXA) 5 MG tablet Take 1 tablet (5 mg total) by mouth at bedtime. 01/21/19   Harris, Abigail, PA-C  phenylephrine (,USE FOR PREPARATION-H,) 0.25 % suppository Place 1 suppository rectally 2 (two) times daily. 07/19/19   Margarita Mail, PA-C  traZODone (DESYREL) 100 MG tablet Take 1 tablet (100 mg  total) by mouth at bedtime and may repeat dose one time if needed. Patient not taking: Reported on 01/21/2019 03/04/18   Mordecai Maes, NP    Allergies    Patient has no known allergies.  Review of Systems   Review of Systems  Constitutional: Negative for chills and fever.  HENT: Negative for congestion and rhinorrhea.   Eyes: Negative for redness and visual disturbance.  Respiratory: Negative for shortness of breath and wheezing.   Cardiovascular: Negative for chest pain and palpitations.  Gastrointestinal: Positive for abdominal pain and rectal pain. Negative for nausea and vomiting.  Genitourinary: Negative for dysuria and urgency.  Musculoskeletal: Negative for arthralgias and myalgias.  Skin: Negative for pallor and wound.  Neurological: Negative for dizziness and headaches.    Physical Exam Updated Vital Signs BP 125/63   Pulse 65   Temp 98.3 F (36.8 C) (Oral)   Resp 16   LMP 03/02/2019   SpO2 100%   Physical Exam Vitals and nursing note reviewed.  Constitutional:      General: She is not in acute distress.    Appearance: She is well-developed. She is not diaphoretic.  HENT:     Head: Normocephalic and atraumatic.  Eyes:     Pupils: Pupils are equal, round, and reactive to light.  Cardiovascular:     Rate and Rhythm: Normal rate and regular rhythm.     Heart sounds: No murmur. No friction rub. No gallop.   Pulmonary:     Effort: Pulmonary effort is normal.     Breath sounds: No wheezing or rales.  Abdominal:     General: There is no distension.     Palpations: Abdomen is soft.     Tenderness: There is abdominal tenderness in the right lower quadrant, suprapubic area and left lower quadrant.  Musculoskeletal:        General: No tenderness.     Cervical back: Normal range of motion and neck supple.  Skin:    General: Skin is warm and dry.  Neurological:     Mental Status: She is alert and oriented to person, place, and time.  Psychiatric:         Behavior: Behavior normal.     ED Results / Procedures / Treatments   Labs (all labs ordered are listed, but only abnormal results are displayed) Labs Reviewed  WET PREP, GENITAL - Abnormal; Notable for the following components:      Result Value   Clue Cells Wet Prep HPF POC PRESENT (*)    WBC, Wet Prep HPF POC FEW (*)    All other components within normal limits  COMPREHENSIVE METABOLIC PANEL - Abnormal; Notable for the following components:   Calcium 8.8 (*)    All other components within normal limits  I-STAT BETA HCG BLOOD, ED (MC, WL, AP ONLY) - Abnormal; Notable for the following components:   I-stat hCG, quantitative >2,000.0 (*)    All other components within normal limits  LIPASE, BLOOD  CBC  URINALYSIS, ROUTINE W REFLEX MICROSCOPIC  GC/CHLAMYDIA PROBE AMP ([place]) NOT AT [facility]    EKG None  Radiology No results found.  Procedures Procedures (including critical care time) EMERGENCY DEPARTMENT [place] PREGNANCY "Study: Limited Ultrasound of the Pelvis"  INDICATIONS:Pregnancy(required) and Abdominal or pelvic pain Multiple views of the uterus and pelvic cavity are obtained with a multi-frequency probe.  APPROACH:Transabdominal   PERFORMED BY: Myself  IMAGES ARCHIVED?: Yes  LIMITATIONS: none  PREGNANCY FREE FLUID: None  PREGNANCY UTERUS FINDINGS:Uterus enlarged and Gestational sac noted ADNEXAL FINDINGS:Left ovary not seen and Right ovary not seen  PREGNANCY FINDINGS: Intrauterine gestational sac noted, Yolk sac noted, Fetal pole present and Fetal heart activity seen  INTERPRETATION: Intrauterine gestational sac noted, Yolk sac noted, Fetal pole present and Fetal heart activity seen  GESTATIONAL AGE, ESTIMATE: 8wk 4 days  Medications Ordered in ED Medications  sodium chloride flush (NS) 0.9 % injection 3 mL (has no administration in time range)  acetaminophen (TYLENOL) tablet 1,000 mg (1,000 mg Oral Given 12/17/19 1924)  lidocaine (XYLOCAINE) 2 % jelly  1 application (1 application Other Given 12/17/19 1947)    ED Course  I have reviewed the triage vital signs and the nursing notes.  Pertinent labs & imaging results that were available during my care of the patient were reviewed by me and considered in my medical decision making (see chart for details).    MDM Rules/Calculators/A&P                      39 yo F with a chief complaints of abdominal pain and rectal pain.  This is been going on for some time.  She is concerned that she may be pregnant.  She also has what she thinks is worsening hemorrhoids.  States she is having some bleeding from her rectum.  Pregnancy test here is positive.  Will perform a pelvic exam.  Bedside ultrasound.  Ultrasound with IUP.  Heart rate.  Patient has a hemorrhoid that is not obviously thrombosed.  We will have her continue to do topical treatment.  Discussed with her about trying to keep her stools soft and limiting time on the toilet.  Given general surgery follow-up.  OB follow-up.  8:09 PM:  I have discussed the diagnosis/risks/treatment options with the patient and believe the pt to be eligible for discharge home to follow-up with Gen surgery, OB. We also discussed returning to the ED immediately if new or worsening sx occur. We discussed the sx which are most concerning (e.g., sudden worsening pain, fever, inability to tolerate by mouth) that necessitate immediate return. Medications administered to the patient during their visit and any new prescriptions provided to the patient are listed below.  Medications given during this visit Medications  sodium chloride flush (NS) 0.9 % injection 3 mL (has no administration in time range)  acetaminophen (TYLENOL) tablet 1,000 mg (1,000 mg Oral Given 12/17/19 1924)  lidocaine (XYLOCAINE) 2 % jelly 1 application (1 application Other Given 12/17/19 1947)     The patient appears reasonably screen and/or stabilized for discharge and I doubt any other medical condition  or  other [place] requiring further screening, evaluation, or treatment in the ED at this time prior to discharge.   Final Clinical Impression(s) / ED Diagnoses Final diagnoses:  First trimester pregnancy  Acute hemorrhoid    Rx / DC Orders ED Discharge Orders         Ordered    hydrocortisone (ANUSOL-HC) 25 MG suppository  2 times daily     12/17/19 1929    lidocaine (XYLOCAINE) 5 % ointment  As needed     12/17/19 Heyworth, Dell, DO 12/17/19 2009

## 2019-12-17 NOTE — ED Triage Notes (Signed)
Pt reports hemorrhoid is inflamed and leaking fluids-red blood since last night. Used cream that was given for it at hospital but not helping. Also having abd pains and unsure if pregnant as hasnt had menstrual cycle in couple months.

## 2019-12-17 NOTE — Discharge Instructions (Signed)
Follow up with your doctor.  You need to start taking Colace every day.  He also may need to add MiraLAX to try and keep your stools soft.  Try to spend a little time on the toilet as you can.

## 2019-12-19 LAB — GC/CHLAMYDIA PROBE AMP (~~LOC~~) NOT AT ARMC
Chlamydia: NEGATIVE
Comment: NEGATIVE
Comment: NORMAL
Neisseria Gonorrhea: NEGATIVE

## 2021-09-03 ENCOUNTER — Other Ambulatory Visit: Payer: Self-pay

## 2021-09-03 ENCOUNTER — Ambulatory Visit: Payer: Self-pay

## 2021-09-10 ENCOUNTER — Inpatient Hospital Stay (HOSPITAL_COMMUNITY): Payer: Medicaid Other

## 2021-09-10 ENCOUNTER — Other Ambulatory Visit: Payer: Self-pay

## 2021-09-10 ENCOUNTER — Inpatient Hospital Stay (HOSPITAL_COMMUNITY)
Admission: AD | Admit: 2021-09-10 | Discharge: 2021-09-10 | Disposition: A | Discharge status: Home / Self Care | Payer: Medicaid Other | Attending: Obstetrics and Gynecology | Admitting: Obstetrics and Gynecology

## 2021-09-10 ENCOUNTER — Encounter (HOSPITAL_COMMUNITY): Payer: Self-pay | Admitting: Obstetrics and Gynecology

## 2021-09-10 ENCOUNTER — Inpatient Hospital Stay (HOSPITAL_BASED_OUTPATIENT_CLINIC_OR_DEPARTMENT_OTHER): Payer: Medicaid Other

## 2021-09-10 DIAGNOSIS — R102 Pelvic and perineal pain: Secondary | ICD-10-CM | POA: Diagnosis not present

## 2021-09-10 DIAGNOSIS — O99322 Drug use complicating pregnancy, second trimester: Secondary | ICD-10-CM | POA: Diagnosis not present

## 2021-09-10 DIAGNOSIS — O26899 Other specified pregnancy related conditions, unspecified trimester: Secondary | ICD-10-CM

## 2021-09-10 DIAGNOSIS — F1020 Alcohol dependence, uncomplicated: Secondary | ICD-10-CM | POA: Insufficient documentation

## 2021-09-10 DIAGNOSIS — B9689 Other specified bacterial agents as the cause of diseases classified elsewhere: Secondary | ICD-10-CM | POA: Insufficient documentation

## 2021-09-10 DIAGNOSIS — O99012 Anemia complicating pregnancy, second trimester: Secondary | ICD-10-CM | POA: Insufficient documentation

## 2021-09-10 DIAGNOSIS — O09522 Supervision of elderly multigravida, second trimester: Secondary | ICD-10-CM | POA: Insufficient documentation

## 2021-09-10 DIAGNOSIS — Z3A27 27 weeks gestation of pregnancy: Secondary | ICD-10-CM

## 2021-09-10 DIAGNOSIS — O4692 Antepartum hemorrhage, unspecified, second trimester: Secondary | ICD-10-CM | POA: Diagnosis not present

## 2021-09-10 DIAGNOSIS — R450 Nervousness: Secondary | ICD-10-CM | POA: Diagnosis not present

## 2021-09-10 DIAGNOSIS — F1290 Cannabis use, unspecified, uncomplicated: Secondary | ICD-10-CM | POA: Insufficient documentation

## 2021-09-10 DIAGNOSIS — O99312 Alcohol use complicating pregnancy, second trimester: Secondary | ICD-10-CM | POA: Diagnosis not present

## 2021-09-10 DIAGNOSIS — O26852 Spotting complicating pregnancy, second trimester: Secondary | ICD-10-CM | POA: Insufficient documentation

## 2021-09-10 DIAGNOSIS — O26859 Spotting complicating pregnancy, unspecified trimester: Secondary | ICD-10-CM

## 2021-09-10 DIAGNOSIS — O0932 Supervision of pregnancy with insufficient antenatal care, second trimester: Secondary | ICD-10-CM

## 2021-09-10 DIAGNOSIS — Z91410 Personal history of adult physical and sexual abuse: Secondary | ICD-10-CM | POA: Diagnosis not present

## 2021-09-10 DIAGNOSIS — O23592 Infection of other part of genital tract in pregnancy, second trimester: Secondary | ICD-10-CM | POA: Diagnosis not present

## 2021-09-10 DIAGNOSIS — D649 Anemia, unspecified: Secondary | ICD-10-CM | POA: Diagnosis not present

## 2021-09-10 DIAGNOSIS — E669 Obesity, unspecified: Secondary | ICD-10-CM | POA: Insufficient documentation

## 2021-09-10 DIAGNOSIS — Z3689 Encounter for other specified antenatal screening: Secondary | ICD-10-CM

## 2021-09-10 DIAGNOSIS — O99212 Obesity complicating pregnancy, second trimester: Secondary | ICD-10-CM | POA: Insufficient documentation

## 2021-09-10 DIAGNOSIS — O26892 Other specified pregnancy related conditions, second trimester: Secondary | ICD-10-CM

## 2021-09-10 DIAGNOSIS — R109 Unspecified abdominal pain: Secondary | ICD-10-CM

## 2021-09-10 LAB — WET PREP, GENITAL
Sperm: NONE SEEN
Trich, Wet Prep: NONE SEEN
WBC, Wet Prep HPF POC: >=10 — AB (ref <10)
Yeast Wet Prep HPF POC: NONE SEEN

## 2021-09-10 LAB — ABO/RH: ABO/RH(D): A POS

## 2021-09-10 LAB — CBC
HCT: 29 % — ABNORMAL LOW (ref 36.0–46.0)
Hemoglobin: 9.7 g/dL — ABNORMAL LOW (ref 12.0–15.0)
MCH: 30.8 pg (ref 26.0–34.0)
MCHC: 33.4 g/dL (ref 30.0–36.0)
MCV: 92.1 fL (ref 80.0–100.0)
Platelets: 281 10*3/uL (ref 150–400)
RBC: 3.15 MIL/uL — ABNORMAL LOW (ref 3.87–5.11)
RDW: 14.9 % (ref 11.5–15.5)
WBC: 8.5 10*3/uL (ref 4.0–10.5)
nRBC: 0 % (ref 0.0–0.2)

## 2021-09-10 LAB — HIV ANTIBODY (ROUTINE TESTING W REFLEX): HIV Screen 4th Generation wRfx: NONREACTIVE

## 2021-09-10 LAB — HEPATITIS B SURFACE ANTIGEN: Hepatitis B Surface Ag: NONREACTIVE

## 2021-09-10 MED ORDER — FERROUS SULFATE 325 (65 FE) MG PO TBEC: 325.0000 mg | DELAYED_RELEASE_TABLET | ORAL | 3 refills | Status: DC

## 2021-09-10 MED ORDER — ASCORBIC ACID 500 MG PO TABS: 500.0000 mg | ORAL_TABLET | ORAL | 3 refills | Status: DC

## 2021-09-10 MED ORDER — METRONIDAZOLE 500 MG PO TABS: 500.0000 mg | ORAL_TABLET | Freq: Two times a day (BID) | ORAL | 0 refills | Status: DC
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
## 2021-09-11 LAB — RPR: RPR Ser Ql: NONREACTIVE

## 2021-09-11 LAB — GC/CHLAMYDIA PROBE AMP (~~LOC~~) NOT AT ARMC
Chlamydia: NEGATIVE
Comment: NEGATIVE
Comment: NORMAL
Neisseria Gonorrhea: NEGATIVE

## 2021-09-11 LAB — RUBELLA SCREEN: Rubella: 4.53 index (ref >0.99)

## 2021-09-16 ENCOUNTER — Other Ambulatory Visit: Payer: Self-pay

## 2021-09-16 ENCOUNTER — Encounter: Payer: Self-pay | Admitting: Advanced Practice Midwife

## 2021-09-16 ENCOUNTER — Other Ambulatory Visit (HOSPITAL_COMMUNITY)
Admission: RE | Admit: 2021-09-16 | Discharge: 2021-09-16 | Disposition: A | Discharge status: Home / Self Care | Payer: Medicaid Other | Source: Ambulatory Visit | Attending: Advanced Practice Midwife | Admitting: Advanced Practice Midwife

## 2021-09-16 ENCOUNTER — Ambulatory Visit (INDEPENDENT_AMBULATORY_CARE_PROVIDER_SITE_OTHER): Payer: Self-pay | Admitting: Advanced Practice Midwife

## 2021-09-16 VITALS — BP 108/62 | HR 81 | Wt 167.6 lb

## 2021-09-16 DIAGNOSIS — O0933 Supervision of pregnancy with insufficient antenatal care, third trimester: Secondary | ICD-10-CM | POA: Insufficient documentation

## 2021-09-16 DIAGNOSIS — Z3480 Encounter for supervision of other normal pregnancy, unspecified trimester: Secondary | ICD-10-CM | POA: Diagnosis not present

## 2021-09-16 DIAGNOSIS — Z3A27 27 weeks gestation of pregnancy: Secondary | ICD-10-CM

## 2021-09-16 DIAGNOSIS — O09529 Supervision of elderly multigravida, unspecified trimester: Secondary | ICD-10-CM | POA: Insufficient documentation

## 2021-09-16 DIAGNOSIS — O0990 Supervision of high risk pregnancy, unspecified, unspecified trimester: Secondary | ICD-10-CM | POA: Diagnosis not present

## 2021-09-16 DIAGNOSIS — F1020 Alcohol dependence, uncomplicated: Secondary | ICD-10-CM

## 2021-09-16 DIAGNOSIS — T7491XA Unspecified adult maltreatment, confirmed, initial encounter: Secondary | ICD-10-CM | POA: Insufficient documentation

## 2021-09-16 DIAGNOSIS — F1990 Other psychoactive substance use, unspecified, uncomplicated: Secondary | ICD-10-CM

## 2021-09-16 DIAGNOSIS — F332 Major depressive disorder, recurrent severe without psychotic features: Secondary | ICD-10-CM

## 2021-09-16 DIAGNOSIS — O0932 Supervision of pregnancy with insufficient antenatal care, second trimester: Secondary | ICD-10-CM

## 2021-09-16 MED ORDER — BLOOD PRESSURE KIT DEVI: 1.0000 | 0 refills | Status: DC | PRN

## 2021-09-16 MED ORDER — ASPIRIN EC 81 MG PO TBEC: 81.0000 mg | DELAYED_RELEASE_TABLET | Freq: Every day | ORAL | 11 refills | Status: DC

## 2021-09-16 MED ORDER — GOJJI WEIGHT SCALE MISC: 1.0000 | 0 refills | Status: DC | PRN

## 2021-09-16 MED ORDER — PRENATAL PLUS VITAMIN/MINERAL 27-1 MG PO TABS: 1.0000 | ORAL_TABLET | Freq: Every day | ORAL | 10 refills | Status: AC

## 2021-09-16 NOTE — Progress Notes (Signed)
Pt presents for initial NOB visit has concerns about "ovarian cyst."  PHQ9=15  GAD7= 14 Pt sees [facility] [place]
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
## 2021-09-17 LAB — HEMOGLOBIN A1C
Est. average glucose Bld gHb Est-mCnc: 111 mg/dL
Hgb A1c MFr Bld: 5.5 % (ref 4.8–5.6)

## 2021-09-17 LAB — CYTOLOGY - PAP
Comment: NEGATIVE
Diagnosis: NEGATIVE
High risk HPV: NEGATIVE

## 2021-09-18 DIAGNOSIS — Z419 Encounter for procedure for purposes other than remedying health state, unspecified: Secondary | ICD-10-CM | POA: Diagnosis not present

## 2021-09-18 LAB — CULTURE, OB URINE

## 2021-09-18 LAB — URINE CULTURE, OB REFLEX

## 2021-09-23 ENCOUNTER — Encounter: Payer: Self-pay | Admitting: Advanced Practice Midwife

## 2021-09-23 LAB — TOXASSURE SELECT 13 (MW), URINE

## 2021-09-30 ENCOUNTER — Encounter: Payer: Self-pay | Admitting: Advanced Practice Midwife

## 2021-09-30 ENCOUNTER — Encounter: Payer: Self-pay | Admitting: Obstetrics and Gynecology

## 2021-10-01 ENCOUNTER — Other Ambulatory Visit: Payer: Medicaid Other

## 2021-10-01 ENCOUNTER — Other Ambulatory Visit: Payer: Self-pay

## 2021-10-01 ENCOUNTER — Ambulatory Visit (INDEPENDENT_AMBULATORY_CARE_PROVIDER_SITE_OTHER): Payer: Medicaid Other | Admitting: Obstetrics and Gynecology

## 2021-10-01 ENCOUNTER — Encounter: Payer: Self-pay | Admitting: Obstetrics and Gynecology

## 2021-10-01 VITALS — BP 110/76 | HR 92 | Wt 165.0 lb

## 2021-10-01 DIAGNOSIS — F1420 Cocaine dependence, uncomplicated: Secondary | ICD-10-CM

## 2021-10-01 DIAGNOSIS — O34219 Maternal care for unspecified type scar from previous cesarean delivery: Secondary | ICD-10-CM

## 2021-10-01 DIAGNOSIS — F332 Major depressive disorder, recurrent severe without psychotic features: Secondary | ICD-10-CM

## 2021-10-01 DIAGNOSIS — O0932 Supervision of pregnancy with insufficient antenatal care, second trimester: Secondary | ICD-10-CM

## 2021-10-01 DIAGNOSIS — O09529 Supervision of elderly multigravida, unspecified trimester: Secondary | ICD-10-CM

## 2021-10-01 DIAGNOSIS — F1020 Alcohol dependence, uncomplicated: Secondary | ICD-10-CM

## 2021-10-01 NOTE — Progress Notes (Signed)
Pt complains of Right lower pelvic pain.  PHQ9= 12 today GAD=13

## 2021-10-01 NOTE — Patient Instructions (Signed)
AREA PEDIATRIC/FAMILY PRACTICE PHYSICIANS  [place] ([address]) [facility] Chambliss, MD; Eniola, MD; Hale, MD; Hensel, MD; McDiarmid, MD; McIntyer, MD; Neal, MD; Walden, MD [address] [place], [place] [address] [phone] Mon-Fri 8:30-12:30, 1:30-5:00 Providers come to see babies at [facility] Accepting Medicaid [facility] providers who accept newborns: Koirala, MD; Morrow, MD; Wolters, MD [address], [place], [place] [address] [phone] Mon-Fri 8:00-5:30 Babies seen by providers at [facility] Does NOT accept Medicaid Please call early in hospitalization for appointment (limited availability)  [facility] [place], MD [address] [place], [place] [address] [phone] Mon, Tue, Thur, Fri 8:30-5:00, Wed 10:00-7:00 (closed 1-2pm) Babies seen by [facility] providers Accepting Medicaid Rubin - Pediatrician Rubin, MD [address], [place], [place] [address] [phone] Mon-Fri 8:30-5:00, Sat 8:30-12:00 Provider comes to see babies at [facility] Accepting Medicaid Must have been referred from current patients or contacted office prior to delivery [facility] ([facility]) Brown, MD; Chandler, MD; Ettefagh, MD; Grant, MD; Lester, MD; McCormick, MD; McQueen, MD; Prose, MD; Simha, MD; Stanley, MD; Stryffeler, NP; Tebben, NP [address], [place], [place] [address] [phone] Mon, Tue, Thur, Fri 8:30-5:30, Wed 9:30-5:30, Sat 8:30-12:30 Babies seen by [facility] providers Accepting Medicaid Only accepting infants of first-time parents or siblings of current patients Hospital discharge coordinator will make follow-up appointment Jack Amos [address], [place], [place]  [address] [phone]   Fax - [phone] [facility] [address], [place], [place]  [address] Phone - [phone]   Fax - [phone] Shilpa Gosrani [address], [place], [place]  [address] [phone]  [place] ([address]) [facility] Bates, MD; Brassfield, MD; Cooper, Cox, MD; MD; Davis, MD; Dovico, MD; Ettefaugh, MD; Little, MD; Lowe, MD; Keiffer, MD; Melvin, MD; Sumner, MD; Williams, MD [address], [place], [place] [address] [phone] Mon-Fri 8:30-5:00 (extended evenings Mon-Thur as needed), Sat-Sun 10:00-1:00 Providers come to see babies at [facility] Accepting Medicaid for families of first-time babies and families with all children in the household age 3 and under. Must register with office prior to making appointment (M-F only). [facility] Henson, NP; Knapp, MD; Lalonde, MD; Tysinger, PA [address] [place], [place] [address] [phone] Mon-Fri 8:00-5:00 Babies seen by providers at [facility] Does NOT accept Medicaid/Commercial Insurance Only [facility] ([facility])  Artis, MD; Barnes, MD; Bratton, MD; Coccaro, MD; [place] Gardner, MD; Kramer, MD; Marshall, MD; Netherton, MD; Poleto, MD; Skinner, MD [address] [place], [place] [address] [phone] Mon-Fri 8:30-5:30, Sat (Oct.-Mar.) 9:00-1:00 Babies seen by providers at [facility] Accepting Medicaid  [place] ([address]) [facility] Reid, MD; Warner, MD [address], [place], [place] [address] [phone] Mon-Fri 8:30-5:00, Sat 8:30-12:00 Providers come to see babies at [facility] Does NOT accept Medicaid [facility] Becker, PA; Hagler, MD; Scifres, PA; Sun, MD; Swayne, MD [address], ,  [address] [phone] Mon-Fri 8:00-5:00 Babies seen by providers at [facility] Does NOT accept Medicaid Only accepting babies of parents who  are patients Please call early in hospitalization for appointment (limited availability)  Pediatricians Clark, MD; Frye, MD; Kelleher, MD; Mack, NP; Miller, MD; O'Keller, MD; Patterson, NP; Pudlo, MD; Puzio, MD; Thomas, MD; Tucker, MD; Twiselton, MD [address], [place], [place] [address] [phone] Mon-Fri 8:00-5:00, Sat 9:00-12:00 Providers come to see babies at [facility] Does NOT accept Medicaid  [place] ([address]) [facility] providers accepting new patients: Brake, NP; Wharton, PA [address], [place], [place] [address] [phone] Mon-Fri 8:00-5:00 Babies seen by providers at [facility] Does NOT accept Medicaid Only accepting babies of parents who are patients Please call early in hospitalization for appointment (limited availability) [facility] Gay, MD; Quinlan, MD [address] [place], [place] [address] [phone] (press 1 to schedule appointment) Mon-Fri 8:00-5:00 Providers come to see babies at [facility] Does NOT accept Medicaid [facility] Mazer, MD [address] [place], [place] [address] [phone] Mon-Fri 8:30-5:00 (lunch 12:30-1:00), extended hours by appointment only Wed 5:00-6:30 Babies seen by [facility] providers Accepting Medicaid [facility] Banks, MD; Jordan, MD; Koberlein, MD [address], [place], [place] [address] [phone] Mon-Fri 8:00-5:00 Babies seen by [facility] providers Does NOT accept Medicaid [facility] Parker, MD; Hunter, MD; Wallace, DO [address] [place], [place] [address] [phone] Mon-Fri 8:00-5:00 Babies seen by [facility] providers Does NOT accept Medicaid [facility] Brandon, PA; Brecken, PA; Christy, NP; Dees, MD; DeClaire, MD; DeWeese, MD; Hansen, NP; Mills, NP; Parrish, NP; Smoot, NP; Summer, MD; Vapne,  MD [address] [place], [place] [address] [phone] Mon-Fri 8:30-5:00, Sat 10:00-1:00 Providers come to see babies at [facility] Does NOT accept Medicaid Free prenatal information session Tuesdays at 4:45pm [facility] Bouska, MD; Gordon, PA; Jeffery, PA; Weber, PA [address] [place] [address] [phone] Mon-Fri 7:30-5:30 Babies seen by [facility] providers [facility] [address], [place], [place]  [address] [phone]   Fax - [phone]  [place] ([address] & [address]) [facility] [place] [address] [place], [place] [address] [phone] Mon-Thur 8:00-6:00 Providers come to see babies at [facility] Accepting Medicaid [facility] Anderson, NP; Badger, MD; Beal, PA; [place], PA [address] [place], [place] [address] [phone] Mon-Thur 7:30-7:30, Fri 7:30-4:30 Babies seen by [facility] providers Accepting Medicaid [facility] Agbuya, MD; Klett, NP; Romgoolam, MD [address], [place], [place] [address] [phone] Mon-Fri 8:30-5:00, Sat 8:30-12:00 Providers come to see babies at [facility] Accepting Medicaid Must have "Meet & Greet" appointment at office prior to delivery [facility] ([facility]) McCord, MD; Wallace, MD; Wood, MD [address], [place], [place] [address] [phone] Mon-Wed 8:00-6:00, Thur-Fri 8:00-5:00, Sat 9:00-12:00 Providers come to see babies at [facility] Does NOT accept Medicaid Only accepting siblings of current patients [facility]  [address], [place], [place]  [address] [phone]   Fax - [phone] [facility] [address], [place], [place]  [address] [phone]   Fax -  [phone]  [place] ([address] & [address]) [facility] Cirigliano, DO; Matthews, DO [address] [place], [place] [address] [phone] Mon-Fri 7:00-5:00 Babies seen by [facility] providers Does NOT accept Medicaid [facility] Briscoe, MD; Howley, PA; [place], PA [address], [place], [place] [address] [phone] Mon-Fri 8:00-5:00 Babies seen by [facility] providers Accepting Medicaid [facility] Boyd, MD; Church, PA; Jones, NP; Osborn, PA [address], [place],  [address] [phone] Mon-Fri 8:00-5:00 Babies seen by providers at [facility] Accepting Medicaid  [place] ([address]) [facility] [place], DO [address] [place], [place] [address] [phone] Mon-Fri 8:00-5:00 Babies seen by [facility] providers Does NOT accept Medicaid Limited availability, please call early in hospitalization to schedule follow-up [facility] Calderon, PA; Cummings, MD; Dillard, MD; Martin, PA; Olson, MD; VanDeven, PA [address], [place], [place] [address] [phone] Mon-Fri 8:30-5:00, Sat 9:00-12:00 Babies seen by providers at [facility] Accepting Medicaid Please register online then schedule online or call office [email or web address] Hunter, NP; Kumar, MD; [place] Rogers, PA [address], [place], [place] [address] [phone] Mon-Fri 8:00-5:00 Babies seen by providers at [facility] Accepting Medicaid [facility] ([facility]) [place], MD; Kristi Fleenor, NP; West, MD [address], [place], [place] [address] [phone] Mon-Fri 8:00-5:30, Sat&Sun by appointment (phones open at  8:30) Babies seen by [facility] providers Accepting Medicaid Must be a first-time baby or sibling of current patient [facility] [place]  [address], [place], [place]  [address] [phone]   Fax - [phone]  [place] ([address]) [facility] Brown, PA; Cowen, PA; Rice, MD; Helton, PA; Spry, MD [address] [place], [place] [address] [phone] Mon-Thur 8:00-7:00, Fri 8:00-5:00, Sat 8:00-12:00, Sun 9:00-12:00 Babies seen by [facility] providers Accepting Medicaid [facility] Coe-Goins, MD; Marshall, MD; Pierre-Louis, MD [address] [place] [address], [place], [place] [address] [phone] Mon-Thur 8:00-5:00 Babies seen by providers at [facility] Accepting Medicaid [facility] Bratton, MD; Coe-Goins, MD; Hayes, MD; Lewis, MD; List, MD; Lott, MD; Marshall, MD; Moran, MD; O'Neal, MD; Pierre-Louis, MD; Pitonzo, MD; Scholer, MD; Spangle, MD [address] [place], [place] [address] [phone] Mon-Fri 8:00-5:30, Sat (Oct.-Mar.) 9:00-1:00 Babies seen by providers at [facility] Accepting Medicaid Must fill out new patient packet, available online at [email or web address] [facility] ([facility]) Friddle, NP; Harris, NP; Kelly, NP; Logan, MD; Melvin, PA; Poth, MD; Ramadoss, MD; Stanton, NP [address], [place], [place] [address] [phone] Mon-Thur 8:00-5:30, Fri 8:00-5:00 Babies seen by providers at [facility] Accepting Medicaid  [place] ([address]) [facility] Dixon, PA; [place], MD; Pickard, MD; Tapia, PA [address], [place], [place] [address] [phone] Mon-Fri 8:00-5:00 Babies seen by providers at [facility] Accepting Medicaid   [place] ([address]) [facility] [place] Masneri, DO; Meyers, MD; Nelson, PA [address], [place], [place] [address] [phone] Mon-Fri 8:00-5:00 Babies seen by providers at [facility] Does NOT accept Medicaid Limited appointment availability, please call early in hospitalization  [facility] [place] Kunedd, DO; McGowen, MD [address], [place], [place] [address] [phone] Mon-Fri 8:00-5:00 Babies seen by [facility] providers Does NOT accept Medicaid [facility] [place] Cameron, MD; MacDonald, MD; Michaels, PA; Nayak, MD [address] [place] [address], [place], [place] [address] [phone] Mon-Fri 8:00-5:00 After hours clinic ([address] [place], [place] [address]) [phone] Mon-Fri 5:00-8:00, Sat 12:00-6:00, Sun 10:00-4:00 Babies seen by [facility] providers Accepting Medicaid [facility] [place] [address], [place], [place]  [address] [phone]   Fax - [phone]  [place] ([address]) [facility] [place] [facility] Andy, MD [address], [place], [place] [address] [phone] Mon-Fri 8:00-5:00 Babies seen by [facility] providers Does NOT accept Medicaid [facility] [place] ([facility] [place]) Eksir, MD [address], [place], [place] [address] [phone] Mon-Thur 8:00-7:00, Fri 8:00-5:00, Sat 8:00-12:00 Babies seen by providers at [facility] Accepting Medicaid - but does not have vaccinations in office (must be received elsewhere) Limited availability, please call early in hospitalization  [place] ([address]) [facility]  Charlene Flemming, MD [address], [place] [address] [phone]  Fax [phone]  [facility]  Kimberly Newton, MD, Annamarie Streilein, PA, Carla [place], PA [address] [place], [place]  [address] [phone] [facility]  [address], [place], [place] [address] [phone] [address], [place], [place] [address] [phone] (West Office)  [facility] [address], [place], [place] [address] [phone] [facility] [address], [place], [place] [address] [phone] [facility] [address], [place], [place] [address] [phone] [facility] [address], [place], [place] [address] [phone] [facility] [address], [place], [place] [address] [phone] [facility] [address], [place], [place] [address] [phone] [facility]  [address], [place], [place] [address] [phone] Dr[facility] [address] [place], [place], [place] [address] [phone] [facility] [address], [place], [place] [address] [phone] [facility] [address], ,  [address] [phone]  
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
## 2021-10-04 ENCOUNTER — Other Ambulatory Visit: Payer: Medicaid Other

## 2021-10-09 ENCOUNTER — Other Ambulatory Visit: Payer: Self-pay

## 2021-10-09 ENCOUNTER — Encounter: Payer: Self-pay | Admitting: Advanced Practice Midwife

## 2021-10-09 ENCOUNTER — Inpatient Hospital Stay (HOSPITAL_COMMUNITY)
Admission: AD | Admit: 2021-10-09 | Discharge: 2021-10-09 | Disposition: A | Discharge status: Home / Self Care | Payer: Medicaid Other | Attending: Obstetrics & Gynecology | Admitting: Obstetrics & Gynecology

## 2021-10-09 DIAGNOSIS — R109 Unspecified abdominal pain: Secondary | ICD-10-CM | POA: Insufficient documentation

## 2021-10-09 DIAGNOSIS — O09523 Supervision of elderly multigravida, third trimester: Secondary | ICD-10-CM | POA: Diagnosis not present

## 2021-10-09 DIAGNOSIS — F1410 Cocaine abuse, uncomplicated: Secondary | ICD-10-CM | POA: Diagnosis not present

## 2021-10-09 DIAGNOSIS — O99323 Drug use complicating pregnancy, third trimester: Secondary | ICD-10-CM | POA: Diagnosis not present

## 2021-10-09 DIAGNOSIS — F1020 Alcohol dependence, uncomplicated: Secondary | ICD-10-CM | POA: Diagnosis not present

## 2021-10-09 DIAGNOSIS — Z7982 Long term (current) use of aspirin: Secondary | ICD-10-CM | POA: Insufficient documentation

## 2021-10-09 DIAGNOSIS — F1420 Cocaine dependence, uncomplicated: Secondary | ICD-10-CM | POA: Diagnosis not present

## 2021-10-09 DIAGNOSIS — F1010 Alcohol abuse, uncomplicated: Secondary | ICD-10-CM | POA: Insufficient documentation

## 2021-10-09 DIAGNOSIS — N858 Other specified noninflammatory disorders of uterus: Secondary | ICD-10-CM | POA: Diagnosis not present

## 2021-10-09 DIAGNOSIS — O368190 Decreased fetal movements, unspecified trimester, not applicable or unspecified: Secondary | ICD-10-CM | POA: Diagnosis not present

## 2021-10-09 DIAGNOSIS — R102 Pelvic and perineal pain: Secondary | ICD-10-CM | POA: Insufficient documentation

## 2021-10-09 DIAGNOSIS — Z3A31 31 weeks gestation of pregnancy: Secondary | ICD-10-CM | POA: Insufficient documentation

## 2021-10-09 DIAGNOSIS — O26893 Other specified pregnancy related conditions, third trimester: Secondary | ICD-10-CM | POA: Insufficient documentation

## 2021-10-09 LAB — URINALYSIS, ROUTINE W REFLEX MICROSCOPIC
Bilirubin Urine: NEGATIVE
Glucose, UA: NEGATIVE mg/dL
Hgb urine dipstick: NEGATIVE
Ketones, ur: NEGATIVE mg/dL
Leukocytes,Ua: NEGATIVE
Nitrite: NEGATIVE
Protein, ur: NEGATIVE mg/dL
Specific Gravity, Urine: 1.018 (ref 1.005–1.030)
pH: 5 (ref 5.0–8.0)

## 2021-10-09 LAB — FETAL FIBRONECTIN: Fetal Fibronectin: NEGATIVE

## 2021-10-09 NOTE — MAU Provider Note (Signed)
Chief Complaint:  Contractions and Decreased Fetal Movement   Event Date/Time   First Provider Initiated Contact with Patient 10/09/21 2022     HPI: Denise Brandt is a 41 y.o. [id number] at 56w1dho presents to maternity admissions reporting decreased fetal movement and contractions.  States they are every 30 minutes. Denies recent cocaine or alcohol use, but hesitated when I asked. We discussed that it can interact with some of the PTL meds we use, which is why I asked. Pt asked if it affects baby, discussed all substances taken also go to the baby. . After I left room, pt told RN she was using Cocaine and alcohol every 3 days.  [facility] Assured her we cannot disclose info to anyone she does not authorize. She denies LOF, vaginal bleeding, vaginal itching/burning, urinary symptoms, h/a, dizziness, n/v, diarrhea, constipation or fever/chills.    Abdominal Pain This is a new problem. The current episode started today. The problem occurs intermittently. The quality of the pain is cramping. The abdominal pain does not radiate. Pertinent negatives include no constipation, diarrhea, fever or frequency. Nothing aggravates the pain. The pain is relieved by Nothing. She has tried nothing for the symptoms.   RN Note: Denise Brandt a 41y.o. at 335w1dere in MAU reporting: Ctx, sharp cramping in her lower ABD/pelvic pain on and off for the last two days about every 30 mins. Pt reports DFM since 1400, states she feels soft movements but not as active as before. Pt states she attempts to move baby and no movement. Pt has not been very hydration or drinking water. Pt denies VB, LOF, abnormal discharge, and no other complications in the pregnancy.    Onset of complaint: two days ago  Past Medical History: Past Medical History:  Diagnosis Date   Anemia    Depression    Ovarian cyst     Past obstetric history: OB History  Gravida Para Term  Preterm AB Living  9 2 2   6 2   SAB IAB Ectopic Multiple Live Births  4 2     2     # Outcome Date GA Lbr Len/2nd Weight Sex Delivery Anes PTL Lv  9 Current           8 SAB 2012          7 Term 2011     CS-Unspec   LIV  6 SAB 2007          5 IAB 2006          4 SAB 2004          3 IAB 2002          2 Term 2000     Vag-Spont   LIV  1 SAB             Past Surgical History: Past Surgical History:  Procedure Laterality Date   CESAREAN SECTION     I & D EXTREMITY Right 02/25/2018   Procedure: IRRIGATION AND DEBRIDEMENT EXTREMITY;  Surgeon: WeCharlotte CrumbMD;  [facility];  Laterality: Right;    Family History: No family history on file.  Social History: Social History   Tobacco Use   Smoking status: Every Day    Types: Cigars   Smokeless tobacco: Never  Vaping Use   Vaping Use: Never used  Substance Use Topics   Alcohol use: Yes    Alcohol/week: 2.0  standard drinks    Types: 2 Cans of beer per week    Comment: has not had a drink for two weeks but did have one beer today.   Drug use: Yes    Types: Marijuana    Comment: has had cocain in her marajuana    Allergies: Not on File  Meds:  Medications Prior to Admission  Medication Sig Dispense Refill Last Dose   acyclovir (ZOVIRAX) 400 MG tablet Take 1 tablet (400 mg total) by mouth 4 (four) times daily. (Patient not taking: Reported on 09/16/2021) 50 tablet 0    aspirin EC 81 MG tablet Take 1 tablet (81 mg total) by mouth daily. Swallow whole. 30 tablet 11    Blood Pressure Monitoring (BLOOD PRESSURE KIT) DEVI 1 Device by Does not apply route as needed. 1 each 0    ferrous sulfate 325 (65 FE) MG EC tablet Take 1 tablet (325 mg total) by mouth every other day. 30 tablet 3    metroNIDAZOLE (FLAGYL) 500 MG tablet Take 1 tablet (500 mg total) by mouth 2 (two) times daily. 14 tablet 0    Misc. Devices (GOJJI WEIGHT SCALE) MISC 1 Device by Does not apply route as needed. 1 each 0    Prenatal  Vit-Fe Fumarate-FA (PRENATAL PLUS VITAMIN/MINERAL) 27-1 MG TABS Take 1 tablet by mouth daily. 30 tablet 10    vitamin C (VITAMIN C) 500 MG tablet Take 1 tablet (500 mg total) by mouth every other day. 30 tablet 3     I have reviewed patient's Past Medical Hx, Surgical Hx, Family Hx, Social Hx, medications and allergies.   ROS:  Review of Systems  Constitutional:  Negative for fever.  Gastrointestinal:  Positive for abdominal pain. Negative for constipation and diarrhea.  Genitourinary:  Negative for frequency.  Other systems negative  Physical Exam  Patient Vitals for the past 24 hrs:  BP Temp Temp src Pulse SpO2 Height Weight  10/09/21 2001 111/68 97.9 F (36.6 C) Oral 79 99 % 5' 4"  (1.626 m) 74.4 kg   Constitutional: Well-developed, well-nourished female in no acute distress.  Cardiovascular: normal rate and rhythm Respiratory: normal effort GI: Abd soft, non-tender, gravid appropriate for gestational age.   No rebound or guarding. MS: Extremities nontender, no edema, normal ROM Neurologic: Alert and oriented x 4.  GU: Neg CVAT.  PELVIC EXAM: Dilation: Closed Effacement (%): Thick Exam by:: Jimmye Norman CNM  FHT:  Baseline 130 , moderate variability, accelerations present, no decelerations Contractions: Irregular uterine irritability   Labs: Results for orders placed or performed during the hospital encounter of 10/09/21 (from the past 24 hour(s))  Urinalysis, Routine w reflex microscopic     Status: Abnormal   Collection Time: 10/09/21  8:05 PM  Result Value Ref Range   Color, Urine YELLOW YELLOW   APPearance HAZY (A) CLEAR   Specific Gravity, Urine 1.018 1.005 - 1.030   pH 5.0 5.0 - 8.0   Glucose, UA NEGATIVE NEGATIVE mg/dL   Hgb urine dipstick NEGATIVE NEGATIVE   Bilirubin Urine NEGATIVE NEGATIVE   Ketones, ur NEGATIVE NEGATIVE mg/dL   Protein, ur NEGATIVE NEGATIVE mg/dL   Nitrite NEGATIVE NEGATIVE   Leukocytes,Ua NEGATIVE NEGATIVE  Fetal fibronectin     Status:  None   Collection Time: 10/09/21  8:41 PM  Result Value Ref Range   Fetal Fibronectin NEGATIVE NEGATIVE    --/--/A POS (01/24 1557)  Imaging:    MAU Course/MDM: I have ordered labs and reviewed results. UA is clear,  no evid of infection NST reviewed, reassuring Fetal fibronectin is negative Treatments in MAU included EFM.    Assessment: Single IUP at 58w2dUterine irritability, negative Fetal Fibronectin Ongoing Cocaine and Alcohol abuse. [facility] Had discussion of detrimental effects of alcohol and cocaine to fetus Preterm Labor precautions and fetal kick counts Follow up in Office for prenatal visits  Encouraged to return if she develops worsening of symptoms, increase in pain, fever, or other concerning symptoms.  Pt stable at time of discharge.  MHansel FeinsteinCNM, MSN Certified Nurse-Midwife 10/09/2021 8:22 PM

## 2021-10-09 NOTE — MAU Note (Signed)
..  Denise Brandt is a 41 y.o. at [redacted]w[redacted]d here in MAU reporting: Ctx, sharp cramping in her lower ABD/pelvic pain on and off for the last two days about every 30 mins. Pt reports DFM since 1400, states she feels soft movements but not as active as before. Pt states she attempts to move baby and no movement. Pt has not been very hydration or drinking water. Pt denies VB, LOF, abnormal discharge, and no other complications in the pregnancy.   Onset of complaint: two days ago Pain score: 6/10 Vitals:   10/09/21 2001  BP: 111/68  Pulse: 79  Temp: 97.9 F (36.6 C)  SpO2: 99%     FHT:145 Lab orders placed from triage:  UA

## 2021-10-16 ENCOUNTER — Encounter: Payer: Medicaid Other | Admitting: Advanced Practice Midwife

## 2021-10-16 ENCOUNTER — Other Ambulatory Visit: Payer: Medicaid Other

## 2021-10-16 ENCOUNTER — Other Ambulatory Visit: Payer: Self-pay | Admitting: Advanced Practice Midwife

## 2021-10-16 DIAGNOSIS — F1020 Alcohol dependence, uncomplicated: Secondary | ICD-10-CM

## 2021-10-16 DIAGNOSIS — O99323 Drug use complicating pregnancy, third trimester: Secondary | ICD-10-CM

## 2021-10-16 DIAGNOSIS — F1420 Cocaine dependence, uncomplicated: Secondary | ICD-10-CM

## 2021-10-16 DIAGNOSIS — Z419 Encounter for procedure for purposes other than remedying health state, unspecified: Secondary | ICD-10-CM | POA: Diagnosis not present

## 2021-10-25 ENCOUNTER — Encounter: Payer: Medicaid Other | Admitting: Certified Nurse Midwife

## 2021-10-25 ENCOUNTER — Other Ambulatory Visit: Payer: Medicaid Other

## 2021-10-30 ENCOUNTER — Encounter: Payer: Medicaid Other | Admitting: Obstetrics

## 2021-10-30 ENCOUNTER — Other Ambulatory Visit: Payer: Medicaid Other

## 2021-11-05 ENCOUNTER — Encounter: Payer: Medicaid Other | Admitting: Obstetrics

## 2021-11-05 ENCOUNTER — Other Ambulatory Visit: Payer: Medicaid Other

## 2021-11-11 ENCOUNTER — Other Ambulatory Visit: Payer: Medicaid Other

## 2021-11-15 ENCOUNTER — Encounter: Payer: Medicaid Other | Admitting: Obstetrics

## 2021-11-16 DIAGNOSIS — Z419 Encounter for procedure for purposes other than remedying health state, unspecified: Secondary | ICD-10-CM | POA: Diagnosis not present

## 2021-11-19 ENCOUNTER — Ambulatory Visit (INDEPENDENT_AMBULATORY_CARE_PROVIDER_SITE_OTHER): Payer: Medicaid Other | Admitting: Obstetrics and Gynecology

## 2021-11-19 ENCOUNTER — Encounter: Payer: Self-pay | Admitting: Obstetrics and Gynecology

## 2021-11-19 ENCOUNTER — Other Ambulatory Visit (HOSPITAL_COMMUNITY)
Admission: RE | Admit: 2021-11-19 | Discharge: 2021-11-19 | Disposition: A | Discharge status: Home / Self Care | Payer: Medicaid Other | Source: Ambulatory Visit | Attending: Obstetrics and Gynecology | Admitting: Obstetrics and Gynecology

## 2021-11-19 VITALS — BP 118/72 | HR 76 | Wt 169.2 lb

## 2021-11-19 DIAGNOSIS — O34219 Maternal care for unspecified type scar from previous cesarean delivery: Secondary | ICD-10-CM

## 2021-11-19 DIAGNOSIS — O09529 Supervision of elderly multigravida, unspecified trimester: Secondary | ICD-10-CM | POA: Insufficient documentation
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
## 2021-11-19 NOTE — Patient Instructions (Signed)

## 2021-11-19 NOTE — Progress Notes (Signed)
Pt reports fetal movement and pressure. ?

## 2021-11-20 LAB — CERVICOVAGINAL ANCILLARY ONLY
Chlamydia: NEGATIVE
Comment: NEGATIVE
Comment: NORMAL
Neisseria Gonorrhea: NEGATIVE

## 2021-11-21 LAB — STREP GP B NAA: Strep Gp B NAA: NEGATIVE

## 2021-11-25 ENCOUNTER — Other Ambulatory Visit: Payer: Medicaid Other

## 2021-11-26 ENCOUNTER — Encounter: Payer: Self-pay | Admitting: Obstetrics and Gynecology

## 2021-11-26 ENCOUNTER — Ambulatory Visit (INDEPENDENT_AMBULATORY_CARE_PROVIDER_SITE_OTHER): Payer: Medicaid Other | Admitting: Obstetrics and Gynecology

## 2021-11-26 VITALS — BP 130/87 | HR 80 | Wt 171.4 lb

## 2021-11-26 DIAGNOSIS — Z3A38 38 weeks gestation of pregnancy: Secondary | ICD-10-CM

## 2021-11-26 DIAGNOSIS — F1420 Cocaine dependence, uncomplicated: Secondary | ICD-10-CM

## 2021-11-26 DIAGNOSIS — O09523 Supervision of elderly multigravida, third trimester: Secondary | ICD-10-CM | POA: Diagnosis not present

## 2021-11-26 DIAGNOSIS — O0933 Supervision of pregnancy with insufficient antenatal care, third trimester: Secondary | ICD-10-CM

## 2021-11-26 DIAGNOSIS — O09529 Supervision of elderly multigravida, unspecified trimester: Secondary | ICD-10-CM

## 2021-11-26 DIAGNOSIS — O34219 Maternal care for unspecified type scar from previous cesarean delivery: Secondary | ICD-10-CM | POA: Diagnosis not present

## 2021-11-26 DIAGNOSIS — F332 Major depressive disorder, recurrent severe without psychotic features: Secondary | ICD-10-CM

## 2021-11-26 NOTE — Progress Notes (Signed)
Pt in office for [place] visit. Pt has concerns about her baby and is requesting an ultrasound. Pt scored high on her PHQ9 and GAD7 and is interested in counseling services.  ?
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
## 2021-11-27 ENCOUNTER — Ambulatory Visit: Payer: Medicaid Other | Admitting: *Deleted

## 2021-11-27 ENCOUNTER — Encounter (HOSPITAL_COMMUNITY): Payer: Self-pay | Admitting: *Deleted

## 2021-11-27 ENCOUNTER — Telehealth (HOSPITAL_COMMUNITY): Payer: Self-pay | Admitting: *Deleted

## 2021-11-27 ENCOUNTER — Ambulatory Visit: Payer: Medicaid Other | Attending: Obstetrics and Gynecology

## 2021-11-27 VITALS — BP 140/83 | HR 76

## 2021-11-27 DIAGNOSIS — O34219 Maternal care for unspecified type scar from previous cesarean delivery: Secondary | ICD-10-CM | POA: Diagnosis not present

## 2021-11-27 DIAGNOSIS — O0933 Supervision of pregnancy with insufficient antenatal care, third trimester: Secondary | ICD-10-CM | POA: Diagnosis not present

## 2021-11-27 DIAGNOSIS — O09529 Supervision of elderly multigravida, unspecified trimester: Secondary | ICD-10-CM | POA: Insufficient documentation

## 2021-11-27 NOTE — Telephone Encounter (Signed)
Preadmission screen  

## 2021-11-29 ENCOUNTER — Inpatient Hospital Stay (HOSPITAL_COMMUNITY)
Admission: AD | Admit: 2021-11-29 | Discharge: 2021-12-02 | DRG: 787 | Disposition: A | Discharge status: Home / Self Care | Payer: Medicaid Other | Attending: Obstetrics & Gynecology | Admitting: Obstetrics & Gynecology

## 2021-11-29 ENCOUNTER — Inpatient Hospital Stay (HOSPITAL_COMMUNITY): Payer: Medicaid Other | Admitting: Anesthesiology

## 2021-11-29 ENCOUNTER — Other Ambulatory Visit: Payer: Self-pay

## 2021-11-29 ENCOUNTER — Encounter (HOSPITAL_COMMUNITY)
Admission: AD | Disposition: A | Discharge status: Home / Self Care | Payer: Self-pay | Source: Home / Self Care | Attending: Obstetrics & Gynecology

## 2021-11-29 ENCOUNTER — Encounter (HOSPITAL_COMMUNITY): Payer: Self-pay | Admitting: Obstetrics and Gynecology

## 2021-11-29 DIAGNOSIS — O34219 Maternal care for unspecified type scar from previous cesarean delivery: Secondary | ICD-10-CM | POA: Diagnosis present

## 2021-11-29 DIAGNOSIS — Z98891 History of uterine scar from previous surgery: Principal | ICD-10-CM

## 2021-11-29 DIAGNOSIS — F17290 Nicotine dependence, other tobacco product, uncomplicated: Secondary | ICD-10-CM | POA: Diagnosis present

## 2021-11-29 DIAGNOSIS — O134 Gestational [pregnancy-induced] hypertension without significant proteinuria, complicating childbirth: Secondary | ICD-10-CM | POA: Diagnosis not present

## 2021-11-29 DIAGNOSIS — O34211 Maternal care for low transverse scar from previous cesarean delivery: Secondary | ICD-10-CM | POA: Diagnosis not present

## 2021-11-29 DIAGNOSIS — O0933 Supervision of pregnancy with insufficient antenatal care, third trimester: Secondary | ICD-10-CM

## 2021-11-29 DIAGNOSIS — O99324 Drug use complicating childbirth: Secondary | ICD-10-CM | POA: Diagnosis not present

## 2021-11-29 DIAGNOSIS — D62 Acute posthemorrhagic anemia: Secondary | ICD-10-CM | POA: Diagnosis not present

## 2021-11-29 DIAGNOSIS — F1420 Cocaine dependence, uncomplicated: Secondary | ICD-10-CM | POA: Diagnosis present

## 2021-11-29 DIAGNOSIS — F1020 Alcohol dependence, uncomplicated: Secondary | ICD-10-CM | POA: Diagnosis present

## 2021-11-29 DIAGNOSIS — O09513 Supervision of elderly primigravida, third trimester: Secondary | ICD-10-CM | POA: Diagnosis not present

## 2021-11-29 DIAGNOSIS — O139 Gestational [pregnancy-induced] hypertension without significant proteinuria, unspecified trimester: Secondary | ICD-10-CM | POA: Diagnosis present

## 2021-11-29 DIAGNOSIS — R030 Elevated blood-pressure reading, without diagnosis of hypertension: Secondary | ICD-10-CM | POA: Diagnosis not present

## 2021-11-29 DIAGNOSIS — Z8759 Personal history of other complications of pregnancy, childbirth and the puerperium: Secondary | ICD-10-CM | POA: Diagnosis present

## 2021-11-29 DIAGNOSIS — O09523 Supervision of elderly multigravida, third trimester: Secondary | ICD-10-CM | POA: Diagnosis present

## 2021-11-29 DIAGNOSIS — O09529 Supervision of elderly multigravida, unspecified trimester: Secondary | ICD-10-CM

## 2021-11-29 DIAGNOSIS — O99314 Alcohol use complicating childbirth: Secondary | ICD-10-CM | POA: Diagnosis not present

## 2021-11-29 DIAGNOSIS — O99334 Smoking (tobacco) complicating childbirth: Secondary | ICD-10-CM | POA: Diagnosis not present

## 2021-11-29 DIAGNOSIS — Z3A38 38 weeks gestation of pregnancy: Secondary | ICD-10-CM

## 2021-11-29 DIAGNOSIS — O411230 Chorioamnionitis, third trimester, not applicable or unspecified: Secondary | ICD-10-CM | POA: Diagnosis not present

## 2021-11-29 DIAGNOSIS — O9081 Anemia of the puerperium: Secondary | ICD-10-CM | POA: Diagnosis not present

## 2021-11-29 DIAGNOSIS — F17200 Nicotine dependence, unspecified, uncomplicated: Secondary | ICD-10-CM | POA: Diagnosis present

## 2021-11-29 DIAGNOSIS — F319 Bipolar disorder, unspecified: Secondary | ICD-10-CM | POA: Diagnosis not present

## 2021-11-29 DIAGNOSIS — O99313 Alcohol use complicating pregnancy, third trimester: Secondary | ICD-10-CM | POA: Diagnosis not present

## 2021-11-29 DIAGNOSIS — O99344 Other mental disorders complicating childbirth: Secondary | ICD-10-CM | POA: Diagnosis not present

## 2021-11-29 LAB — CBC
HCT: 33.7 % — ABNORMAL LOW (ref 36.0–46.0)
HCT: 35.2 % — ABNORMAL LOW (ref 36.0–46.0)
Hemoglobin: 11.1 g/dL — ABNORMAL LOW (ref 12.0–15.0)
Hemoglobin: 11.9 g/dL — ABNORMAL LOW (ref 12.0–15.0)
MCH: 30.7 pg (ref 26.0–34.0)
MCH: 31.4 pg (ref 26.0–34.0)
MCHC: 32.9 g/dL (ref 30.0–36.0)
MCHC: 33.8 g/dL (ref 30.0–36.0)
MCV: 93.4 fL (ref 80.0–100.0)
MCV: 92.9 fL (ref 80.0–100.0)
Platelets: 194 10*3/uL (ref 150–400)
Platelets: 192 10*3/uL (ref 150–400)
RBC: 3.61 MIL/uL — ABNORMAL LOW (ref 3.87–5.11)
RBC: 3.79 MIL/uL — ABNORMAL LOW (ref 3.87–5.11)
RDW: 15.1 % (ref 11.5–15.5)
RDW: 15.1 % (ref 11.5–15.5)
WBC: 5.2 10*3/uL (ref 4.0–10.5)
WBC: 6.3 10*3/uL (ref 4.0–10.5)
nRBC: 0 % (ref 0.0–0.2)
nRBC: 0 % (ref 0.0–0.2)

## 2021-11-29 LAB — TYPE AND SCREEN
ABO/RH(D): A POS
Antibody Screen: NEGATIVE

## 2021-11-29 LAB — URINALYSIS, ROUTINE W REFLEX MICROSCOPIC
Bilirubin Urine: NEGATIVE
Glucose, UA: NEGATIVE mg/dL
Hgb urine dipstick: NEGATIVE
Ketones, ur: NEGATIVE mg/dL
Leukocytes,Ua: NEGATIVE
Nitrite: NEGATIVE
Protein, ur: NEGATIVE mg/dL
Specific Gravity, Urine: 1.004 — ABNORMAL LOW (ref 1.005–1.030)
pH: 6 (ref 5.0–8.0)

## 2021-11-29 LAB — RAPID URINE DRUG SCREEN, HOSP PERFORMED
Amphetamines: NOT DETECTED
Barbiturates: NOT DETECTED
Benzodiazepines: NOT DETECTED
Cocaine: POSITIVE — AB
Opiates: NOT DETECTED
Tetrahydrocannabinol: NOT DETECTED

## 2021-11-29 LAB — COMPREHENSIVE METABOLIC PANEL
ALT: 14 U/L (ref 0–44)
AST: 18 U/L (ref 15–41)
Albumin: 2.5 g/dL — ABNORMAL LOW (ref 3.5–5.0)
Alkaline Phosphatase: 129 U/L — ABNORMAL HIGH (ref 38–126)
Anion gap: 6 (ref 5–15)
BUN: 9 mg/dL (ref 6–20)
CO2: 18 mmol/L — ABNORMAL LOW (ref 22–32)
Calcium: 8.3 mg/dL — ABNORMAL LOW (ref 8.9–10.3)
Chloride: 109 mmol/L (ref 98–111)
Creatinine, Ser: 0.58 mg/dL (ref 0.44–1.00)
GFR, Estimated: >60 mL/min (ref >60)
Glucose, Bld: 78 mg/dL (ref 70–99)
Potassium: 3.6 mmol/L (ref 3.5–5.1)
Sodium: 133 mmol/L — ABNORMAL LOW (ref 135–145)
Total Bilirubin: 0.6 mg/dL (ref 0.3–1.2)
Total Protein: 5.5 g/dL — ABNORMAL LOW (ref 6.5–8.1)

## 2021-11-29 LAB — RPR: RPR Ser Ql: NONREACTIVE

## 2021-11-29 LAB — PROTEIN / CREATININE RATIO, URINE
Creatinine, Urine: 25.42 mg/dL
Total Protein, Urine: <6 mg/dL

## 2021-11-29 SURGERY — Surgical Case
Anesthesia: Spinal | Site: Abdomen | Wound class: Clean Contaminated

## 2021-11-29 MED ORDER — PHENYLEPHRINE 40 MCG/ML (10ML) SYRINGE FOR IV PUSH (FOR BLOOD PRESSURE SUPPORT)
PREFILLED_SYRINGE | INTRAVENOUS | Status: AC
  Filled 2021-11-29: qty 10

## 2021-11-29 MED ORDER — PHENYLEPHRINE 40 MCG/ML (10ML) SYRINGE FOR IV PUSH (FOR BLOOD PRESSURE SUPPORT): 80.0000 ug | PREFILLED_SYRINGE | INTRAVENOUS | Status: DC | PRN

## 2021-11-29 MED ORDER — HYDROMORPHONE HCL 1 MG/ML IJ SOLN: 0.2500 mg | INTRAMUSCULAR | Status: DC | PRN

## 2021-11-29 MED ORDER — LACTATED RINGERS IV SOLN: INTRAVENOUS | Status: DC

## 2021-11-29 MED ORDER — TERBUTALINE SULFATE 1 MG/ML IJ SOLN
0.2500 mg | Freq: Once | INTRAMUSCULAR | Status: DC | PRN
Start: 2021-11-29 — End: 2021-11-29

## 2021-11-29 MED ORDER — ONDANSETRON HCL 4 MG/2ML IJ SOLN
INTRAMUSCULAR | Status: AC
  Filled 2021-11-29: qty 2

## 2021-11-29 MED ORDER — DEXMEDETOMIDINE (PRECEDEX) IN NS 20 MCG/5ML (4 MCG/ML) IV SYRINGE
PREFILLED_SYRINGE | INTRAVENOUS | Status: AC
  Filled 2021-11-29: qty 5

## 2021-11-29 MED ORDER — TERBUTALINE SULFATE 1 MG/ML IJ SOLN: 0.2500 mg | Freq: Once | INTRAMUSCULAR | Status: DC | PRN

## 2021-11-29 MED ORDER — SODIUM CHLORIDE 0.9 % IV SOLN
INTRAVENOUS | Status: AC
  Filled 2021-11-29: qty 5

## 2021-11-29 MED ORDER — FENTANYL CITRATE (PF) 100 MCG/2ML IJ SOLN: 50.0000 ug | INTRAMUSCULAR | Status: DC | PRN

## 2021-11-29 MED ORDER — OXYCODONE HCL 5 MG PO TABS: 5.0000 mg | ORAL_TABLET | Freq: Once | ORAL | Status: DC | PRN

## 2021-11-29 MED ORDER — KETOROLAC TROMETHAMINE 30 MG/ML IJ SOLN
30.0000 mg | Freq: Four times a day (QID) | INTRAMUSCULAR | Status: AC
  Administered 2021-11-30 (×3): 30 mg via INTRAVENOUS
  Filled 2021-11-29 (×3): qty 1

## 2021-11-29 MED ORDER — EPHEDRINE SULFATE-NACL 50-0.9 MG/10ML-% IV SOSY
PREFILLED_SYRINGE | INTRAVENOUS | Status: DC | PRN
  Administered 2021-11-29: 10 mg via INTRAVENOUS

## 2021-11-29 MED ORDER — OXYCODONE HCL 5 MG/5ML PO SOLN: 5.0000 mg | Freq: Once | ORAL | Status: DC | PRN

## 2021-11-29 MED ORDER — OXYCODONE-ACETAMINOPHEN 5-325 MG PO TABS: 1.0000 | ORAL_TABLET | ORAL | Status: DC | PRN

## 2021-11-29 MED ORDER — DEXAMETHASONE SODIUM PHOSPHATE 10 MG/ML IJ SOLN
INTRAMUSCULAR | Status: AC
  Filled 2021-11-29: qty 1

## 2021-11-29 MED ORDER — TRANEXAMIC ACID-NACL 1000-0.7 MG/100ML-% IV SOLN
INTRAVENOUS | Status: DC | PRN
  Administered 2021-11-29: 1000 mg via INTRAVENOUS

## 2021-11-29 MED ORDER — NALOXONE HCL 4 MG/10ML IJ SOLN
1.0000 ug/kg/h | INTRAVENOUS | Status: DC | PRN
  Filled 2021-11-29: qty 5

## 2021-11-29 MED ORDER — CEFAZOLIN SODIUM-DEXTROSE 2-4 GM/100ML-% IV SOLN
2.0000 g | Freq: Once | INTRAVENOUS | Status: AC
  Administered 2021-11-29: 2 g via INTRAVENOUS

## 2021-11-29 MED ORDER — MEPERIDINE HCL 25 MG/ML IJ SOLN: 6.2500 mg | INTRAMUSCULAR | Status: DC | PRN

## 2021-11-29 MED ORDER — MENTHOL 3 MG MT LOZG: 1.0000 | LOZENGE | OROMUCOSAL | Status: DC | PRN

## 2021-11-29 MED ORDER — OXYTOCIN-SODIUM CHLORIDE 30-0.9 UT/500ML-% IV SOLN
2.5000 [IU]/h | INTRAVENOUS | Status: DC
Start: 2021-11-29 — End: 2021-11-29
  Administered 2021-11-29: 5 [IU] via INTRAVENOUS
  Administered 2021-11-29: 15 [IU] via INTRAVENOUS

## 2021-11-29 MED ORDER — PHENYLEPHRINE HCL-NACL 20-0.9 MG/250ML-% IV SOLN
INTRAVENOUS | Status: DC | PRN
  Administered 2021-11-29: 60 ug/min via INTRAVENOUS

## 2021-11-29 MED ORDER — KETOROLAC TROMETHAMINE 30 MG/ML IJ SOLN
30.0000 mg | Freq: Once | INTRAMUSCULAR | Status: AC | PRN
  Administered 2021-11-29: 30 mg via INTRAVENOUS

## 2021-11-29 MED ORDER — LACTATED RINGERS IV SOLN: 500.0000 mL | INTRAVENOUS | Status: DC | PRN

## 2021-11-29 MED ORDER — OXYTOCIN-SODIUM CHLORIDE 30-0.9 UT/500ML-% IV SOLN
1.0000 m[IU]/min | INTRAVENOUS | Status: DC
  Administered 2021-11-29: 2 m[IU]/min via INTRAVENOUS

## 2021-11-29 MED ORDER — DIPHENHYDRAMINE HCL 25 MG PO CAPS: 25.0000 mg | ORAL_CAPSULE | Freq: Four times a day (QID) | ORAL | Status: DC | PRN

## 2021-11-29 MED ORDER — COCONUT OIL OIL: 1.0000 "application " | TOPICAL_OIL | Status: DC | PRN

## 2021-11-29 MED ORDER — ONDANSETRON HCL 4 MG/2ML IJ SOLN: 4.0000 mg | Freq: Four times a day (QID) | INTRAMUSCULAR | Status: DC | PRN

## 2021-11-29 MED ORDER — NALOXONE HCL 0.4 MG/ML IJ SOLN: 0.4000 mg | INTRAMUSCULAR | Status: DC | PRN

## 2021-11-29 MED ORDER — FENTANYL-BUPIVACAINE-NACL 0.5-0.125-0.9 MG/250ML-% EP SOLN
12.0000 mL/h | EPIDURAL | Status: DC | PRN
  Filled 2021-11-29: qty 250

## 2021-11-29 MED ORDER — HYDRALAZINE HCL 20 MG/ML IJ SOLN
5.0000 mg | INTRAMUSCULAR | Status: DC | PRN
Start: 2021-11-29 — End: 2021-11-29

## 2021-11-29 MED ORDER — HYDROXYZINE HCL 50 MG PO TABS
50.0000 mg | ORAL_TABLET | Freq: Four times a day (QID) | ORAL | Status: DC | PRN
  Filled 2021-11-29: qty 1

## 2021-11-29 MED ORDER — OXYTOCIN BOLUS FROM INFUSION: 333.0000 mL | Freq: Once | INTRAVENOUS | Status: DC

## 2021-11-29 MED ORDER — LABETALOL HCL 5 MG/ML IV SOLN: 20.0000 mg | INTRAVENOUS | Status: DC | PRN

## 2021-11-29 MED ORDER — DEXMEDETOMIDINE HCL IN NACL 400 MCG/100ML IV SOLN
INTRAVENOUS | Status: DC | PRN
  Administered 2021-11-29: 4 ug via INTRAVENOUS

## 2021-11-29 MED ORDER — BUPIVACAINE IN DEXTROSE 0.75-8.25 % IT SOLN
INTRATHECAL | Status: DC | PRN
  Administered 2021-11-29: 12 mg via INTRATHECAL

## 2021-11-29 MED ORDER — WITCH HAZEL-GLYCERIN EX PADS: 1.0000 "application " | MEDICATED_PAD | CUTANEOUS | Status: DC | PRN

## 2021-11-29 MED ORDER — LABETALOL HCL 5 MG/ML IV SOLN: 40.0000 mg | INTRAVENOUS | Status: DC | PRN

## 2021-11-29 MED ORDER — LACTATED RINGERS IV SOLN: 500.0000 mL | Freq: Once | INTRAVENOUS | Status: DC

## 2021-11-29 MED ORDER — PHENYLEPHRINE HCL-NACL 20-0.9 MG/250ML-% IV SOLN
INTRAVENOUS | Status: AC
  Filled 2021-11-29: qty 250

## 2021-11-29 MED ORDER — ENOXAPARIN SODIUM 40 MG/0.4ML IJ SOSY
40.0000 mg | PREFILLED_SYRINGE | INTRAMUSCULAR | Status: DC
  Administered 2021-11-30 – 2021-12-02 (×3): 40 mg via SUBCUTANEOUS
  Filled 2021-11-29 (×3): qty 0.4

## 2021-11-29 MED ORDER — DIPHENHYDRAMINE HCL 50 MG/ML IJ SOLN
12.5000 mg | INTRAMUSCULAR | Status: DC | PRN
  Administered 2021-11-30: 12.5 mg via INTRAVENOUS
  Filled 2021-11-29: qty 1

## 2021-11-29 MED ORDER — FUROSEMIDE 20 MG PO TABS
20.0000 mg | ORAL_TABLET | Freq: Every day | ORAL | Status: DC
  Administered 2021-11-30 – 2021-12-02 (×3): 20 mg via ORAL
  Filled 2021-11-29 (×3): qty 1

## 2021-11-29 MED ORDER — SCOPOLAMINE 1 MG/3DAYS TD PT72
1.0000 | MEDICATED_PATCH | Freq: Once | TRANSDERMAL | Status: AC
  Administered 2021-11-29: 1.5 mg via TRANSDERMAL
  Filled 2021-11-29: qty 1

## 2021-11-29 MED ORDER — ONDANSETRON HCL 4 MG/2ML IJ SOLN: 4.0000 mg | Freq: Three times a day (TID) | INTRAMUSCULAR | Status: DC | PRN

## 2021-11-29 MED ORDER — FENTANYL CITRATE (PF) 100 MCG/2ML IJ SOLN
INTRAMUSCULAR | Status: AC
  Filled 2021-11-29: qty 2

## 2021-11-29 MED ORDER — DIPHENHYDRAMINE HCL 25 MG PO CAPS: 25.0000 mg | ORAL_CAPSULE | ORAL | Status: DC | PRN

## 2021-11-29 MED ORDER — MAGNESIUM SULFATE BOLUS VIA INFUSION
4.0000 g | Freq: Once | INTRAVENOUS | Status: DC
  Filled 2021-11-29: qty 1000

## 2021-11-29 MED ORDER — ONDANSETRON HCL 4 MG/2ML IJ SOLN
INTRAMUSCULAR | Status: DC | PRN
  Administered 2021-11-29: 4 mg via INTRAVENOUS

## 2021-11-29 MED ORDER — SIMETHICONE 80 MG PO CHEW
80.0000 mg | CHEWABLE_TABLET | ORAL | Status: DC | PRN
  Administered 2021-11-30 – 2021-12-02 (×3): 80 mg via ORAL
  Filled 2021-11-29 (×2): qty 1

## 2021-11-29 MED ORDER — EPHEDRINE 5 MG/ML INJ: 10.0000 mg | INTRAVENOUS | Status: DC | PRN

## 2021-11-29 MED ORDER — DEXAMETHASONE SODIUM PHOSPHATE 10 MG/ML IJ SOLN
INTRAMUSCULAR | Status: DC | PRN
Start: 2021-11-29 — End: 2021-11-29
  Administered 2021-11-29: 5 mg via INTRAVENOUS

## 2021-11-29 MED ORDER — KETOROLAC TROMETHAMINE 30 MG/ML IJ SOLN
INTRAMUSCULAR | Status: AC
  Filled 2021-11-29: qty 1

## 2021-11-29 MED ORDER — DOCUSATE SODIUM 100 MG PO CAPS: 200.0000 mg | ORAL_CAPSULE | Freq: Every day | ORAL | Status: DC

## 2021-11-29 MED ORDER — OXYTOCIN-SODIUM CHLORIDE 30-0.9 UT/500ML-% IV SOLN: 2.5000 [IU]/h | INTRAVENOUS | Status: AC

## 2021-11-29 MED ORDER — MORPHINE SULFATE (PF) 0.5 MG/ML IJ SOLN
INTRAMUSCULAR | Status: AC
  Filled 2021-11-29: qty 10

## 2021-11-29 MED ORDER — SODIUM CHLORIDE 0.9% FLUSH: 3.0000 mL | INTRAVENOUS | Status: DC | PRN

## 2021-11-29 MED ORDER — OXYTOCIN-SODIUM CHLORIDE 30-0.9 UT/500ML-% IV SOLN
INTRAVENOUS | Status: AC
  Filled 2021-11-29: qty 500

## 2021-11-29 MED ORDER — MAGNESIUM SULFATE 40 GM/1000ML IV SOLN: 2.0000 g/h | INTRAVENOUS | Status: DC

## 2021-11-29 MED ORDER — TETANUS-DIPHTH-ACELL PERTUSSIS 5-2.5-18.5 LF-MCG/0.5 IM SUSY: 0.5000 mL | PREFILLED_SYRINGE | Freq: Once | INTRAMUSCULAR | Status: DC

## 2021-11-29 MED ORDER — CEFAZOLIN SODIUM-DEXTROSE 2-4 GM/100ML-% IV SOLN
INTRAVENOUS | Status: AC
  Filled 2021-11-29: qty 100

## 2021-11-29 MED ORDER — PRENATAL MULTIVITAMIN CH
1.0000 | ORAL_TABLET | Freq: Every day | ORAL | Status: DC
  Administered 2021-11-30 – 2021-12-02 (×3): 1 via ORAL
  Filled 2021-11-29 (×3): qty 1

## 2021-11-29 MED ORDER — SENNOSIDES-DOCUSATE SODIUM 8.6-50 MG PO TABS
2.0000 | ORAL_TABLET | Freq: Every day | ORAL | Status: DC
  Administered 2021-11-30 – 2021-12-02 (×3): 2 via ORAL
  Filled 2021-11-29 (×4): qty 2

## 2021-11-29 MED ORDER — SODIUM CHLORIDE 0.9 % IV SOLN
INTRAVENOUS | Status: DC | PRN
  Administered 2021-11-29: 500 mg via INTRAVENOUS

## 2021-11-29 MED ORDER — ONDANSETRON HCL 4 MG/2ML IJ SOLN: 4.0000 mg | Freq: Once | INTRAMUSCULAR | Status: DC | PRN

## 2021-11-29 MED ORDER — BISACODYL 5 MG PO TBEC
5.0000 mg | DELAYED_RELEASE_TABLET | Freq: Every day | ORAL | Status: DC | PRN
  Filled 2021-11-29: qty 1

## 2021-11-29 MED ORDER — LIDOCAINE HCL (PF) 1 % IJ SOLN
30.0000 mL | INTRAMUSCULAR | Status: DC | PRN
Start: 2021-11-29 — End: 2021-11-29

## 2021-11-29 MED ORDER — FENTANYL CITRATE (PF) 100 MCG/2ML IJ SOLN
INTRAMUSCULAR | Status: DC | PRN
  Administered 2021-11-29: 15 ug via INTRATHECAL

## 2021-11-29 MED ORDER — ACETAMINOPHEN 500 MG PO TABS
1000.0000 mg | ORAL_TABLET | Freq: Four times a day (QID) | ORAL | Status: DC
  Administered 2021-11-29 – 2021-12-02 (×10): 1000 mg via ORAL
  Filled 2021-11-29 (×11): qty 2

## 2021-11-29 MED ORDER — HYDRALAZINE HCL 20 MG/ML IJ SOLN: 10.0000 mg | INTRAMUSCULAR | Status: DC | PRN

## 2021-11-29 MED ORDER — EPHEDRINE 5 MG/ML INJ
INTRAVENOUS | Status: AC
  Filled 2021-11-29: qty 5

## 2021-11-29 MED ORDER — SOD CITRATE-CITRIC ACID 500-334 MG/5ML PO SOLN
30.0000 mL | ORAL | Status: DC | PRN
  Administered 2021-11-29: 30 mL via ORAL
  Filled 2021-11-29: qty 30

## 2021-11-29 MED ORDER — IBUPROFEN 600 MG PO TABS
600.0000 mg | ORAL_TABLET | Freq: Four times a day (QID) | ORAL | Status: DC
  Administered 2021-11-30 – 2021-12-02 (×7): 600 mg via ORAL
  Filled 2021-11-29 (×7): qty 1

## 2021-11-29 MED ORDER — DIPHENHYDRAMINE HCL 50 MG/ML IJ SOLN: 12.5000 mg | INTRAMUSCULAR | Status: DC | PRN

## 2021-11-29 MED ORDER — OXYCODONE HCL 5 MG PO TABS
5.0000 mg | ORAL_TABLET | ORAL | Status: DC | PRN
  Administered 2021-11-30: 5 mg via ORAL
  Administered 2021-11-30: 10 mg via ORAL
  Administered 2021-11-30 (×2): 5 mg via ORAL
  Administered 2021-12-01 – 2021-12-02 (×8): 10 mg via ORAL
  Filled 2021-11-29 (×2): qty 2
  Filled 2021-11-29: qty 1
  Filled 2021-11-29 (×3): qty 2
  Filled 2021-11-29: qty 1
  Filled 2021-11-29: qty 2
  Filled 2021-11-29: qty 1
  Filled 2021-11-29 (×3): qty 2

## 2021-11-29 MED ORDER — PHENYLEPHRINE 40 MCG/ML (10ML) SYRINGE FOR IV PUSH (FOR BLOOD PRESSURE SUPPORT)
PREFILLED_SYRINGE | INTRAVENOUS | Status: DC | PRN
Start: 2021-11-29 — End: 2021-11-29
  Administered 2021-11-29 (×4): 80 ug via INTRAVENOUS

## 2021-11-29 MED ORDER — DIBUCAINE (PERIANAL) 1 % EX OINT: 1.0000 "application " | TOPICAL_OINTMENT | CUTANEOUS | Status: DC | PRN

## 2021-11-29 MED ORDER — ACETAMINOPHEN 325 MG PO TABS
650.0000 mg | ORAL_TABLET | ORAL | Status: DC | PRN
  Administered 2021-11-29: 650 mg via ORAL
  Filled 2021-11-29: qty 2

## 2021-11-29 MED ORDER — MORPHINE SULFATE (PF) 0.5 MG/ML IJ SOLN
INTRAMUSCULAR | Status: DC | PRN
  Administered 2021-11-29: 150 ug via EPIDURAL

## 2021-11-29 MED ORDER — OXYTOCIN-SODIUM CHLORIDE 30-0.9 UT/500ML-% IV SOLN
1.0000 m[IU]/min | INTRAVENOUS | Status: DC
  Filled 2021-11-29: qty 500

## 2021-11-29 SURGICAL SUPPLY — 44 items
APL PRP STRL LF DISP 70% ISPRP (MISCELLANEOUS) ×2
APL SKNCLS STERI-STRIP NONHPOA (GAUZE/BANDAGES/DRESSINGS) ×1
BARRIER ADHS 3X4 INTERCEED (GAUZE/BANDAGES/DRESSINGS) ×1 IMPLANT
BENZOIN TINCTURE PRP APPL 2/3 (GAUZE/BANDAGES/DRESSINGS) ×1 IMPLANT
BRR ADH 4X3 ABS CNTRL BYND (GAUZE/BANDAGES/DRESSINGS) ×1
CHLORAPREP W/TINT 26 (MISCELLANEOUS) ×4 IMPLANT
CLAMP UMBILICAL CORD (MISCELLANEOUS) ×2 IMPLANT
CLOSURE STERI STRIP 1/2 X4 (GAUZE/BANDAGES/DRESSINGS) ×1 IMPLANT
CLOTH BEACON ORANGE TIMEOUT ST (SAFETY) ×2 IMPLANT
DRSG OPSITE POSTOP 4X10 (GAUZE/BANDAGES/DRESSINGS) ×2 IMPLANT
DRSG TELFA 3X8 NADH (GAUZE/BANDAGES/DRESSINGS) ×2 IMPLANT
ELECT REM PT RETURN 9FT ADLT (ELECTROSURGICAL) ×2
ELECTRODE REM PT RTRN 9FT ADLT (ELECTROSURGICAL) ×1 IMPLANT
EXTRACTOR VACUUM KIWI (MISCELLANEOUS) IMPLANT
GLOVE BIOGEL PI IND STRL 7.0 (GLOVE) ×1 IMPLANT
GLOVE BIOGEL PI INDICATOR 7.0 (GLOVE) ×1
GLOVE SURG ORTHO 8.0 STRL STRW (GLOVE) ×2 IMPLANT
GOWN STRL REUS W/TWL LRG LVL3 (GOWN DISPOSABLE) ×4 IMPLANT
HEMOSTAT ARISTA ABSORB 3G PWDR (HEMOSTASIS) ×1 IMPLANT
KIT ABG SYR 3ML LUER SLIP (SYRINGE) IMPLANT
NDL HYPO 25X5/8 SAFETYGLIDE (NEEDLE) IMPLANT
NEEDLE HYPO 25X5/8 SAFETYGLIDE (NEEDLE) IMPLANT
NS IRRIG 1000ML POUR BTL (IV SOLUTION) ×2 IMPLANT
PACK C SECTION WH (CUSTOM PROCEDURE TRAY) ×2 IMPLANT
PAD ABD 8X10 STRL (GAUZE/BANDAGES/DRESSINGS) ×2 IMPLANT
PAD DRESSING TELFA 3X8 NADH (GAUZE/BANDAGES/DRESSINGS) IMPLANT
PAD OB MATERNITY 4.3X12.25 (PERSONAL CARE ITEMS) ×2 IMPLANT
RTRCTR C-SECT PINK 25CM LRG (MISCELLANEOUS) IMPLANT
SPONGE GAUZE 4X4 12PLY STER LF (GAUZE/BANDAGES/DRESSINGS) ×2 IMPLANT
STRIP CLOSURE SKIN 1/2X4 (GAUZE/BANDAGES/DRESSINGS) IMPLANT
SUT MON AB-0 CT1 36 (SUTURE) ×5 IMPLANT
SUT PLAIN 0 NONE (SUTURE) IMPLANT
SUT PLAIN 2 0 (SUTURE) ×2
SUT PLAIN ABS 2-0 CT1 27XMFL (SUTURE) IMPLANT
SUT VIC AB 0 CT1 27 (SUTURE) ×4
SUT VIC AB 0 CT1 27XBRD ANBCTR (SUTURE) ×2 IMPLANT
SUT VIC AB 2-0 CT1 27 (SUTURE) ×4
SUT VIC AB 2-0 CT1 TAPERPNT 27 (SUTURE) ×1 IMPLANT
SUT VIC AB 4-0 SH 27 (SUTURE) ×2
SUT VIC AB 4-0 SH 27XANBCTRL (SUTURE) ×1 IMPLANT
TAPE PAPER 3X10 WHT MICROPORE (GAUZE/BANDAGES/DRESSINGS) ×1 IMPLANT
TOWEL OR 17X24 6PK STRL BLUE (TOWEL DISPOSABLE) ×2 IMPLANT
TRAY FOLEY W/BAG SLVR 14FR LF (SET/KITS/TRAYS/PACK) ×2 IMPLANT
WATER STERILE IRR 1000ML POUR (IV SOLUTION) ×2 IMPLANT

## 2021-11-29 NOTE — H&P (Signed)
Denise Brandt is a 41 y.o. female presenting for cramping/contractions and high blood pressure. Her hx is significant for previous vaginal delivery then C/S x 1, SUD with recent cocaine and alcohol use reported. She had high blood pressure in 11/27/21 at her MFM ultrasound and had high blood pressure again at home today along with a headache. She has not taken anything for her h/a. She has pelvic pressure and intermittent painful contractions.  She reports recent cocaine use in the last 3 days.  She is feeling normal fetal movement.   ? ?. ?OB History   ? ? Gravida  ?9  ? Para  ?2  ? Term  ?2  ? Preterm  ?   ? AB  ?6  ? Living  ?2  ?  ? ? SAB  ?4  ? IAB  ?2  ? Ectopic  ?   ? Multiple  ?   ? Live Births  ?2  ?   ?  ?  ? ?Past Medical History:  ?Diagnosis Date  ? Anemia   ? Anxiety   ? Bipolar 1 disorder ([place])   ? Depression   ? Fibroid   ? Ovarian cyst   ? ?Past Surgical History:  ?Procedure Laterality Date  ? CESAREAN SECTION    ? I & D EXTREMITY Right 02/25/2018  ? Procedure: IRRIGATION AND DEBRIDEMENT EXTREMITY;  Surgeon: [name], MD;  Location: [facility];  Service: Orthopedics;  Laterality: Right;  ? ?Family History: family history includes Diabetes in her father; Hypertension in her father. ?Social History:  reports that she has been smoking cigars. She has never used smokeless tobacco. She reports current alcohol use of about 4.0 standard drinks per week. She reports current drug use. Drugs: Marijuana and Cocaine. ? ? ?  ?Maternal Diabetes: No ?Genetic Screening: Normal ?Maternal Ultrasounds/Referrals: Normal ?Fetal Ultrasounds or other Referrals:  None ?Maternal Substance Abuse:  Yes:  Type: Cocaine ?Significant Maternal Medications:  None ?Significant Maternal Lab Results:  Group B Strep negative ?Other Comments:  None ? ?Review of Systems  ?Constitutional:  Negative for chills, fatigue and fever.  ?Eyes:  Negative for visual disturbance.  ?Respiratory:  Negative for shortness of breath.    ?Cardiovascular:  Negative for chest pain.  ?Gastrointestinal:  Positive for abdominal pain. Negative for vomiting.  ?Genitourinary:  Positive for pelvic pain. Negative for difficulty urinating, dysuria, flank pain, vaginal bleeding, vaginal discharge and vaginal pain.  ?Neurological:  Positive for headaches. Negative for dizziness.  ?Psychiatric/Behavioral: Negative.    ?Maternal Medical History:  ?Reason for admission: Contractions.  ?HTN ? ?Contractions: Onset was yesterday.   ?Frequency: irregular.   ?Perceived severity is moderate.   ?Fetal activity: Perceived fetal activity is normal.   ?Prenatal complications: PIH and substance abuse.   ?Prenatal Complications - Diabetes: none. ?Late to care, no GTT, low risk for GDM ?Dilation: 1 ?Effacement (%): 80 ?Exam by:: Fatima Blank, CNM ?Blood pressure (!) 145/88, pulse 72, temperature 98 ?F (36.7 ?C), temperature source Oral, resp. rate 20, height '5\' 4"'$  (1.626 m), weight 77.6 kg, SpO2 99 %, unknown if currently breastfeeding. ?Maternal Exam:  ?Uterine Assessment: Contraction strength is mild.  Contraction frequency is irregular.  ?Abdomen: Patient reports no abdominal tenderness. Fetal presentation: vertex ?Pelvis: adequate for delivery.   ?Cervix: Cervix evaluated by digital exam.   ? ? ?Fetal Exam ?Fetal Monitor Review: Mode: ultrasound.   ?Baseline rate: 135.  ?Variability: moderate (6-25 bpm).   ?Pattern: accelerations present and no decelerations.   ?Fetal State  Assessment: Category I - tracings are normal. ? ?Physical Exam ?Vitals and nursing note reviewed.  ?Constitutional:   ?   Appearance: She is well-developed.  ?Cardiovascular:  ?   Rate and Rhythm: Normal rate and regular rhythm.  ?   Pulses: Normal pulses.  ?   Heart sounds: Normal heart sounds.  ?Pulmonary:  ?   Effort: Pulmonary effort is normal.  ?Abdominal:  ?   Palpations: Abdomen is soft.  ?Musculoskeletal:     ?   General: Normal range of motion.  ?   Cervical back: Normal range of motion.   ?Skin: ?   General: Skin is warm and dry.  ?Neurological:  ?   Mental Status: She is alert and oriented to person, place, and time.  ?Psychiatric:     ?   Behavior: Behavior normal.     ?   Thought Content: Thought content normal.     ?   Judgment: Judgment normal.  ?  ?Prenatal labs: ?ABO, Rh: --/--/A POS (01/24 1557) ?Antibody:   ?Rubella: 4.53 (01/24 1557) ?RPR: NON REACTIVE (01/24 1557)  ?HBsAg: NON REACTIVE (01/24 1557)  ?HIV: Non Reactive (01/24 1557)  ?GBS: Negative/-- (04/04 1100)  ? ?Assessment/Plan: ?GHTN at 38 weeks ?SUD ?GBS neg ?Declines contraception ? ?Admit to L&D for IOL ?Discussed VBAC vs repeat cesarean on admission, pt signed VBAC consent and is aware of benefits/risks but is unsure of decision today. She had to go to sleep for her emergency cesarean and is worried about this. I discussed how this is rare and for most urgent or stat cesareans, a working epidural can be used but we cannot guarantee this. Pt states understanding and wants to proceed with TOLAC. ?Plan to treat h/a, consider magnesium if unresolved or other severe features ?Pain medication/epidural as desired ?Substance use screening and testing, social work follow up as appropriate ? ?Lattie Haw [place] ?11/29/2021, 3:48 AM ? ? ? ? ?

## 2021-11-29 NOTE — Op Note (Signed)
Denise Brandt ? ?PROCEDURE DATE: 11/29/2021 ? ?PREOPERATIVE DIAGNOSES: Intrauterine pregnancy at 58w3dweeks gestation; history of cesarean section; desire for repeat cesarean section ? ?POSTOPERATIVE DIAGNOSES: The same ? ?PROCEDURE: Repeat Low Transverse Cesarean Section ? ?SURGEON:  Dr. LLynnda Shields? ?ASSISTANT:  Dr. CVilma Meckel ? ?ANESTHESIOLOGY TEAM: Anesthesiologist: HBarnet Glasgow MD ?CRNA: BAsher Muir CRNA; CRande Brunt CRNA ? ?INDICATIONS: [name] a 41y.o. [id number] 337w3dere for cesarean section secondary to the indications listed under preoperative diagnoses; please see preoperative note for further details.  The risks of cesarean section were discussed with the patient including but were not limited to: bleeding which may require transfusion or reoperation; infection which may require antibiotics; injury to bowel, bladder, ureters or other surrounding organs; injury to the fetus; need for additional procedures including hysterectomy in the event of a life-threatening hemorrhage; placental abnormalities wth subsequent pregnancies, incisional problems, thromboembolic phenomenon and other postoperative/anesthesia complications.   The patient concurred with the proposed plan, giving informed written consent for the procedure.   ? ?FINDINGS:  Viable female infant in cephalic presentation.  Apgars 8 and 9.  Clear amniotic fluid.  Intact placenta, three vessel cord.  Significant adhesive disease noted on the anterior aspect of the uterus. Bladder adhered to the anterior aspect of the uterus. Unable to fully visualize bilateral fallopian tubes and ovaries due to adhesions.  ? ?ANESTHESIA: Spinal  ?INTRAVENOUS FLUIDS: 1000 ml   ?ESTIMATED BLOOD LOSS: 955 ml ?URINE OUTPUT:  200 ml ?SPECIMENS: Placenta sent to pathology ?COMPLICATIONS: None immediate ? ?PROCEDURE IN DETAIL:   ?The patient preoperatively received intravenous antibiotics and had sequential compression  devices applied to her lower extremities.  She was then taken to the operating room where spinal anesthesia was administered and was found to be adequate. She was then placed in a dorsal supine position with a leftward tilt, and prepped and draped in a sterile manner.  A foley catheter was placed into her bladder and attached to constant gravity.   ? ?After an adequate timeout was performed, a Pfannenstiel skin incision was made with a scalpel just above her preexisting scar and carried through to the underlying layer of fascia. The fascia was incised in the midline, and this incision was extended bilaterally using the Mayo scissors.  Kocher clamps were applied to the superior aspect of the fascial incision and the underlying rectus muscles were dissected off bluntly and sharply.  A similar process was carried out on the inferior aspect of the fascial incision. The rectus muscles were separated in the midline and the peritoneum was entered bluntly. ? ?Significant adhesions over the anterior aspect of the uterus encountered upon entry. Bladder noted to be adhered to the anterior aspect of the uterus. A bladder flap was made and left sided adhesions were released to allow for adequate exposure.  The Alexis self-retaining retractor was then introduced into the abdominal cavity.   ? ?Attention was turned to the lower uterine segment where a low transverse hysterotomy was made with a scalpel and extended bilaterally bluntly.  The infant was successfully delivered, the cord was clamped and cut after one minute, and the infant was handed over to the awaiting neonatology team. Uterine massage was then administered, and the placenta delivered intact with a three-vessel cord. The uterus was then cleared of clots and debris. ? ?The hysterotomy was closed with 0 Monocryl in a running locked fashion.  A right sided uterine extension was noted and repaired with 0  Monocryl.  Figure-of-eight serosal stitches were placed to help  with hemostasis with good result.  The pelvis was cleared of all clot and debris. Hemostasis was confirmed on all surfaces.  Arista was applied.  The retractor was removed.   ? ?The fascia was then closed using 0 Vicryl in a running fashion.  The subcutaneous layer was irrigated, re-approximated with a 2-0 plain gut running stitch, and the skin was closed with a 4-0 Vicryl subcuticular stitch. The patient tolerated the procedure well. Sponge, instrument and needle counts were correct x 3.  She was taken to the recovery room in stable condition.  ? ?Vilma Meckel, MD ?[facility] Fellow  Faculty Practice  ? ? ?

## 2021-11-29 NOTE — Progress Notes (Signed)
Patient called out stating she no longer wants to [facility] and would like a repeat c/s. She is requesting that the FB be removed. FB removed and Dr. Nelda Marseille notified of patient request.  ? ?Wende Mott, CNM ?11/29/21 ?10:52 AM ? ?
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
## 2021-11-29 NOTE — MAU Provider Note (Incomplete)
Chief Complaint:  Contractions and Hypertension ? ? Event Date/Time  ? First Provider Initiated Contact with Patient 11/29/21 [phone]   ?  ? ?HPI: Denise Brandt is a 41 y.o. [id number] at 37w3dby {Ob dating:14516} who presents to maternity admissions reporting ***. ?She reports good fetal movement, denies LOF, vaginal bleeding, vaginal itching/burning, urinary symptoms, h/a, dizziness, n/v, or fever/chills.   ? ? ?Location: *** ?Quality: *** ?Severity: ***/10 on pain scale ?Duration: *** ?Timing: *** ?Modifying factors: *** ?Associated signs and symptoms: *** ? ?HPI ? ?Past Medical History: ?Past Medical History:  ?Diagnosis Date  ? Anemia   ? Anxiety   ? Bipolar 1 disorder ([place]   ? Depression   ? Fibroid   ? Ovarian cyst   ? ? ?Past obstetric history: ?OB History  ?Gravida Para Term Preterm AB Living  ?_0 ?SAB IAB Ectopic Multiple Live Births  ?_1 ?  ?# Outcome Date GA Lbr Len/2nd Weight Sex Delivery Anes PTL Lv  ?9 Current           ?8 SAB 2012          ?7 Term 2011     CS-Unspec   LIV  ?6 SAB 2007          ?5 IAB 2006          ?4 SAB 2004          ?3 Term 2003     Vag-Spont   LIV  ?2 IAB 2002          ?1 SAB           ? ? ?Past Surgical History: ?Past Surgical History:  ?Procedure Laterality Date  ? CESAREAN SECTION    ? I & D EXTREMITY Right 02/25/2018  ? Procedure: IRRIGATION AND DEBRIDEMENT EXTREMITY;  Surgeon: WCharlotte Crumb MD;  [facility]  Service: Orthopedics;  Laterality: Right;  ? ? ?Family History: ?Family History  ?Problem Relation Age of Onset  ? Hypertension Father   ? Diabetes Father   ? ? ?Social History: ?Social History  ? ?Tobacco Use  ? Smoking status: Every Day  ?  Types: Cigars  ? Smokeless tobacco: Never  ?Vaping Use  ? Vaping Use: Never used  ?Substance Use Topics  ? Alcohol use: Yes  ?  Alcohol/week: 4.0 standard drinks  ?  Types: 2 Glasses of wine, 2 Cans of beer per week  ?  Comment: has not had a drink for two weeks but did have one beer today.states had  alcohol last as of several months ago as of 11/27/2021  ? Drug use: Yes  ?  Types: Marijuana, Cocaine  ?  Comment: has had cocain in her marajuana last used cocaine last week as of 11/27/2021 marijuana months ago as of 11/27/2021  ? ? ?Allergies: No Known Allergies ? ?Meds:  ?Medications Prior to Admission  ?Medication Sig Dispense Refill Last Dose  ? acyclovir (ZOVIRAX) 400 MG tablet Take 1 tablet (400 mg total) by mouth 4 (four) times daily. 50 tablet 0 Past Month  ? aspirin EC 81 MG tablet Take 1 tablet (81 mg total) by mouth daily. Swallow whole. 30 tablet 11 Past Week  ? ferrous sulfate 325 (65 FE) MG EC tablet Take 1 tablet (325 mg total) by mouth every other day. 30 tablet 3 Past Week  ? Prenatal Vit-Fe Fumarate-FA (PRENATAL PLUS VITAMIN/MINERAL) 27-1 MG TABS Take 1 tablet  by mouth daily. 30 tablet 10 11/28/2021  ? Blood Pressure Monitoring (BLOOD PRESSURE KIT) DEVI 1 Device by Does not apply route as needed. (Patient not taking: Reported on 11/27/2021) 1 each 0   ? Misc. Devices (GOJJI WEIGHT SCALE) MISC 1 Device by Does not apply route as needed. (Patient not taking: Reported on 11/27/2021) 1 each 0   ? ? ?ROS:  ?Review of Systems ? ? ?I have reviewed patient's Past Medical Hx, Surgical Hx, Family Hx, Social Hx, medications and allergies.  ? ?Physical Exam  ?Patient Vitals for the past 24 hrs: ? BP Temp Temp src Pulse Resp SpO2 Height Weight  ?11/29/21 0327 (!) 145/88 -- -- 72 -- 99 % -- --  ?11/29/21 0316 -- 98 ?F (36.7 ?C) Oral 74 20 97 % 5' 4" (1.626 m) 77.6 kg  ? ?Constitutional: Well-developed, well-nourished female in no acute distress.  ?Cardiovascular: normal rate ?Respiratory: normal effort ?GI: Abd soft, non-tender, gravid appropriate for gestational age.  ?MS: Extremities nontender, no edema, normal ROM ?Neurologic: Alert and oriented x 4.  ?GU: Neg CVAT. ? ?PELVIC EXAM: Cervix pink, visually closed, without lesion, scant white creamy discharge, vaginal walls and external genitalia normal ?Bimanual  exam: Cervix 0/long/high, firm, anterior, neg CMT, uterus nontender, nonenlarged, adnexa without tenderness, enlargement, or mass ? ?  ? ?FHT:  Baseline *** , moderate variability, accelerations present, no decelerations ?Contractions: q *** mins ?  ?Labs: ?No results found for this or any previous visit (from the past 24 hour(s)). ?--/--/A POS (01/24 1557) ? ?Imaging:  ?[place] MFM OB FOLLOW UP ? ?Result Date: 11/27/2021 ?----------------------------------------------------------------------  OBSTETRICS REPORT                       (Signed Final 11/27/2021 04:54 pm) ---------------------------------------------------------------------- Patient Info  ID #:       [id number]                          D.O.B.:  [birth year] (40 yrs)  Name:       Denise Campi-                 Visit Date: 11/27/2021 03:49 pm              MOORE ---------------------------------------------------------------------- Performed By  Attending:        Tama High MD        Ref. Address:     [address]                                                             [place], [place]  Performed By:     Valda Favia          [facility]                                                             [place] [facility]                                                             [facility]  Referred By:      [facility]                    for [facility] ---------------------------------------------------------------------- Orders  #  Description                           Code        Ordered By  1  [place] MFM OB FOLLOW UP                   [phone]    Lynnda Shields ----------------------------------------------------------------------  #  Order #                     Accession #                Episode #  1  [id number]                   [id number]                 [id number]  ---------------------------------------------------------------------- Indications  Advanced maternal age multigravida 30+,        O21.523  third trimester  [redacted] weeks gestation of pregnancy                Z3A.38  Encounter for antenatal screening,             Z36.9  unspecified  Insufficient Prenatal Care                     V03.50  Drug use complicating pregnancy, third         O45.323  trimester ---------------------------------------------------------------------- Fetal Evaluation  Num Of Fetuses:         1  Fetal Heart Rate(bpm):  129  Cardiac Activity:       Observed  Presentation:           Cephalic  Placenta:               Anterior  P. Cord Insertion:      Visualized, central  Amniotic Fluid  AFI FV:      Within normal limits  AFI Sum(cm)     %Tile       Largest Pocket(cm)  12.1            45          4.2  RUQ(cm)       RLQ(cm)       LUQ(cm)        LLQ(cm)  3             1.4           3.5  4.2 ---------------------------------------------------------------------- Biometry  BPD:      86.6  mm     G. Age:  35w 0d          2  %    CI:        69.14   %    70 - 86                                                          FL/HC:      21.8   %    20.6 - 23.4  HC:      332.6  mm     G. Age:  38w 0d         18  %    HC/AC:      0.98        0.87 - 1.06  AC:       339   mm     G. Age:  37w 6d         41  %    FL/BPD:     83.8   %    71 - 87  FL:       72.6  mm     G. Age:  37w 1d         17  %    FL/AC:      21.4   %    20 - 24  Est. FW:    3178  gm           7 lb     30  % ---------------------------------------------------------------------- OB History  Gravidity:    9         Term:   2        Prem:   0        SAB:   4  TOP:          2       Ectopic:  0        Living: 2 ---------------------------------------------------------------------- Gestational Age  U/S Today:     37w 0d                                        EDD:   12/18/21  Best:          38w 6d     Det. By:  U/S  (09/10/21)          EDD:   12/05/21  ---------------------------------------------------------------------- Anatomy  Cranium:               Appears normal         Aortic Arch:            Previously seen  Cavum:                 Previously seen        Ductal Arch:            Not well visualized  Ve

## 2021-11-29 NOTE — Transfer of Care (Signed)
Immediate Anesthesia Transfer of Care Note ? ?Patient: Denise Brandt ? ?Procedure(s) Performed: CESAREAN SECTION (Abdomen) ? ?Patient Location: PACU ? ?Anesthesia Type:Spinal ? ?Level of Consciousness: awake, alert  and oriented ? ?Airway & Oxygen Therapy: Patient Spontanous Breathing ? ?Post-op Assessment: Report given to RN and Post -op Vital signs reviewed and stable ? ?Post vital signs: Reviewed and stable ? ?Last Vitals:  ?Vitals Value Taken Time  ?BP 111/80 11/29/21 1953  ?Temp    ?Pulse 77 11/29/21 1958  ?Resp 16 11/29/21 1958  ?SpO2 100 % 11/29/21 1958  ?Vitals shown include unvalidated device data. ? ?Last Pain:  ?Vitals:  ? 11/29/21 1457  ?TempSrc: Oral  ?PainSc:   ?   ? ?  ? ?Complications: No notable events documented. ?

## 2021-11-29 NOTE — Anesthesia Procedure Notes (Signed)
Spinal ? ?Patient location during procedure: OB ?Start time: 11/29/2021 6:18 PM ?End time: 11/29/2021 6:23 PM ?Reason for block: surgical anesthesia ?Staffing ?Performed: anesthesiologist  ?Anesthesiologist: Barnet Glasgow, MD ?Preanesthetic Checklist ?Completed: patient identified, IV checked, risks and benefits discussed, surgical consent, monitors and equipment checked, pre-op evaluation and timeout performed ?Spinal Block ?Patient position: sitting ?Prep: DuraPrep and site prepped and draped ?Patient monitoring: heart rate, cardiac monitor, continuous pulse ox and blood pressure ?Approach: midline ?Location: L3-4 ?Injection technique: single-shot ?Needle ?Needle type: Pencan  ?Needle gauge: 24 G ?Needle length: 10 cm ?Needle insertion depth: 8 cm ?Assessment ?Sensory level: T6 ?Events: CSF return ?Additional Notes ? 2 Attempt (s). Pt tolerated procedure well. ? ? ? ?

## 2021-11-29 NOTE — Progress Notes (Signed)
Discussed with Hansel Feinstein, CNM about severe range BP's. '650mg'$  tylenol given for headache per Hansel Feinstein. Due to cocaine usage, awaiting answer from pharmacy about treatment for BP per Hansel Feinstein.  ?

## 2021-11-29 NOTE — Progress Notes (Signed)
Patient ID: Denise Brandt, female   DOB: Dec 06, 1980, 41 y.o.   MRN: [id number] ?Doing well  ?Discussed using foley balloon to assist in labor, agrees ? ?Vitals:  ? 11/29/21 0522 11/29/21 0535 11/29/21 0545 11/29/21 6283  ?BP: (!) 149/87 (!) 157/101 (!) 172/93 (!) 156/101  ?Pulse: 60 65 (!) 57 61  ?Resp:    19  ?Temp:    98 ?F (36.7 ?C)  ?TempSrc:    Oral  ?SpO2:      ?Weight:      ?Height:      ? ?Has headache.   ?Will give Tylenol to see if it can be relieved ?If not, would start Magnesium Sulfate infusion ? ?Dilation: 1 ?Effacement (%): 70 ?Station: -3 ?Presentation: Vertex ?Exam by:: Hansel Feinstein, CNM ? ?Foley balloon inserted and inflated ?Clear amniotic fluid began flowing once foley inserted ? ?Since this happened, will hold on Pitocin for now ?Patient states may decide to go with C/S if labor does not progress ? ?

## 2021-11-29 NOTE — Progress Notes (Addendum)
Patient ID: Denise Brandt, female   DOB: [birth year], 41 y.o.   MRN: [id number] ?Sleeping now ? ?Vitals:  ? 11/29/21 0605 11/29/21 0630 11/29/21 0650 11/29/21 0700  ?BP: (!) 156/101 (!) 143/96 (!) 145/83 131/82  ?Pulse: 61 71 69 76  ?Resp: '19  18 17  '$ ?Temp: 98 ?F (36.7 ?C)     ?TempSrc: Oral     ?SpO2:      ?Weight:      ?Height:      ? ?Headache resolved after Tylenol ? ?FHR reassuring ?UCs every 6-8 min ? ?Cervix deferred ? ?Will hold on Magnesium Sulfate for now given improvement in BPs and headache ? ?Will start Pitocin for induction of labor given that her labor did not accelerate much after SROM ?

## 2021-11-29 NOTE — Anesthesia Postprocedure Evaluation (Signed)
Anesthesia Post Note ? ?Patient: Denise Brandt ? ?Procedure(s) Performed: CESAREAN SECTION (Abdomen) ? ?  ? ?Patient location during evaluation: Mother Baby ?Anesthesia Type: Spinal ?Level of consciousness: oriented and awake and alert ?Pain management: pain level controlled ?Vital Signs Assessment: post-procedure vital signs reviewed and stable ?Respiratory status: spontaneous breathing and respiratory function stable ?Cardiovascular status: blood pressure returned to baseline and stable ?Postop Assessment: no headache, no backache, no apparent nausea or vomiting and able to ambulate ?Anesthetic complications: no ? ? ?No notable events documented. ? ?Last Vitals:  ?Vitals:  ? 11/29/21 2045 11/29/21 2135  ?BP: 125/63 116/77  ?Pulse: (!) 59 63  ?Resp: (!) 22 18  ?Temp: 36.6 ?C 36.4 ?C  ?SpO2: 99% 100%  ?  ?Last Pain:  ?Vitals:  ? 11/29/21 2135  ?TempSrc: Oral  ?PainSc: 0-No pain  ? ?Pain Goal:   ? ?  ?  ?  ?  ?  ?  ?Epidural/Spinal Function Cutaneous sensation: Pins and Needles (11/29/21 2135), Patient able to flex knees: Yes (11/29/21 2135), Patient able to lift hips off bed: No (11/29/21 2135), Back pain beyond tenderness at insertion site: No (11/29/21 2135), Progressively worsening motor and/or sensory loss: No (11/29/21 2135), Bowel and/or bladder incontinence post epidural: No (11/29/21 2135) ? ?Barnet Glasgow ? ? ? ? ?

## 2021-11-29 NOTE — Progress Notes (Signed)
Labor Progress Note ?Denise Brandt is a 41 y.o. [id number] at 34w3dpresented for IOL for gHTN ? ?S:  ?Patient uncomfortable with FB.  ? ?Pitocin has not been started at this point ? ?O:  ?BP 131/82   Pulse 76   Temp 98 ?F (36.7 ?C) (Oral)   Resp 17   Ht '5\' 4"'$  (1.626 m)   Wt 77.6 kg   SpO2 99%   BMI 29.35 kg/m?  ? ?Fetal Tracing: ? ?Baseline: 120 ?Variability: moderate ?Accels: 15x15 ?Decels: none ? ?Toco: 10-15 ? ? ?CVE: Dilation: 1 ?Effacement (%): 70 ?Station: -3 ?Presentation: Vertex ?Exam by:: Denise Brandt CNM ? ? ?A&P: 41y.o. [id number] gHTN ?#Labor: Protein/creat ratio pending, BPs elevated but not severe and remainder of labs within normal limits. ? ?Patient reports to CNM that she is not sure she wants a vaginal delivery anymore. She reports she is frustrated with the pain and how long this is taking. She reports her desire to eat is a contributing factor. Lengthy discussion regarding risks and benefits of both TOLAC and repeat c/s. Patient states she will think about it and wants CNM to come back in 2 hours to make plan.  ? ?#Pain: per patient request ?#FWB: Cat 1 ?#GBS negative ? ?CaWende MottCNM ?10:22 AM ? ?

## 2021-11-29 NOTE — Progress Notes (Signed)
Labor Progress Note [name] is a 41 y.o. [id number] at 93w3dpresented for IOL for gHTN ? ?S:  ?Patient resting. Reports some lower right back pain. States she is frustrated with attempts at epidural placement.  ? ?O:  ?BP (!) 150/87   Pulse 66   Temp 98 ?F (36.7 ?C) (Oral)   Resp 16   Ht '5\' 4"'$  (1.626 m)   Wt 77.6 kg   SpO2 99%   BMI 29.35 kg/m?  ? ?Fetal Tracing: ? ?Baseline: 125 ?Variability:moderate ?Accels: 15x15 ?Decels: none ? ?Toco: occasional uc's ? ? ?CVE: Dilation: 1 ?Effacement (%): 70 ?Station: -3 ?Presentation: Vertex ?Exam by:: CSharolyn DouglasCNM ? ? ?A&P: 41y.o. [id number] gHTN ?#Labor: 3 unsuccessful attempts at epidural due to patient fear and concerns over placement. Patient declines further attempts. Discussed pitocin to attempt to start labor. Patient agreeable to plan of care. Will start pitocin 2x2. Offered replacement of FB and patient declined ?#Pain: per patient request ?#FWB: Cat 1 ?#GBS negative ? ?CaWende MottCNM ?3:01 PM ? ?

## 2021-11-29 NOTE — Progress Notes (Addendum)
MD at bedside to discuss plan of care. Patient now stating she desires TOLAC if she can have epidural. Will get epidural and titrate pitocin after placement.  ? ?Denise Brandt, CNM ?11/29/21 ?11:43 AM ? ?As above reviewed management plans.  Discussed risk/benefit of repeat C-section including risk of bleeding, infection and injury to surrounding organs.  Also discussed risk of recurrent C-sections including risk of adhesions and placenta abnormalities.  Reviewed risk of TOLAC including potential for urgent C-section due to non-reassuring heart tones.  Questions and concerns were addressed.  At this time is ok proceed with TOLAC. ? ?Janyth Pupa, DO ?Attending [place], Faculty Practice ?[facility] ? ? ?

## 2021-11-29 NOTE — Anesthesia Preprocedure Evaluation (Addendum)
Anesthesia Evaluation  ?Patient identified by MRN, date of birth, ID band ?Patient awake ? ? ? ?Reviewed: ?Allergy & Precautions, NPO status , Patient's Chart, lab work & pertinent test results ? ?Airway ?Mallampati: II ? ?TM Distance: >3 FB ?Neck ROM: Full ? ? ? Dental ?no notable dental hx. ?(+) Teeth Intact, Dental Advisory Given ?  ?Pulmonary ?neg pulmonary ROS, Current Smoker and Patient abstained from smoking.,  ?  ?Pulmonary exam normal ? ? ? ? ? ? ? Cardiovascular ?negative cardio ROS ? ? ?Rhythm:Regular Rate:Normal ? ? ?  ?Neuro/Psych ?Anxiety Depression Bipolar Disorder   ? GI/Hepatic ?negative GI ROS, (+)  ?  ? substance abuse (3 days ago per patient) ? alcohol use and cocaine use,   ?Endo/Other  ?negative endocrine ROS ? Renal/GU ?negative Renal ROS  ?negative genitourinary ?  ?Musculoskeletal ?negative musculoskeletal ROS ?(+)  ? Abdominal ?Normal abdominal exam  (+)   ?Peds ? Hematology ? ?(+) Blood dyscrasia, , Lab Results ?     Component                Value               Date                 ?     WBC                      6.3                 11/29/2021           ?     HGB                      11.9 (L)            11/29/2021           ?     HCT                      35.2 (L)            11/29/2021           ?     MCV                      92.9                11/29/2021           ?     PLT                      192                 11/29/2021           ?   ?Anesthesia Other Findings ? ? Reproductive/Obstetrics ?(+) Pregnancy ? ?  ? ? ? ? ? ? ? ? ? ? ? ? ? ?  ?  ? ? ? ? ? ? ? ?Anesthesia Physical ?Anesthesia Plan ? ?ASA: 2 ? ?Anesthesia Plan: Spinal  ? ?Post-op Pain Management:   ? ?Induction:  ? ?PONV Risk Score and Plan: 1 and Ondansetron and Dexamethasone ? ?Airway Management Planned: Natural Airway, Simple Face Mask and Nasal Cannula ? ?Additional Equipment: None ? ?Intra-op Plan:  ? ?Post-operative Plan:  ? ?Informed Consent: I have reviewed the patients History and  Physical, chart, labs and discussed the procedure including  the risks, benefits and alternatives for the proposed anesthesia with the patient or authorized representative who has indicated his/her understanding and acceptance.  ? ? ? ?Dental advisory given ? ?Plan Discussed with: CRNA ? ?Anesthesia Plan Comments: (Lab Results ?     Component                Value               Date                 ?     WBC                      6.3                 11/29/2021           ?     HGB                      11.9 (L)            11/29/2021           ?     HCT                      35.2 (L)            11/29/2021           ?     MCV                      92.9                11/29/2021           ?     PLT                      192                 11/29/2021          )  ? ? ? ? ? ? ?Anesthesia Quick Evaluation ? ?

## 2021-11-29 NOTE — Progress Notes (Signed)
At bedside to review management.  Pt requesting Pitocin to be turned off, she desires a repeat C-section.  Risk benefits and alternatives of cesarean section were discussed with the patient including but not limited to infection, bleeding, damage to bowel , bladder and baby with the need for further surgery. Pt voiced understanding and desires to proceed.  ? ?FHT: 130, moderate variability, +accels, decels ?Toco: occasional ?SVE: deferred ? ?FWB: Cat. I ? ?-OR notified ?-NPO ?-LR @ 125cc/hr ?-Ancef 2g IV to OR ?-SCDs to bedside ? ?Janyth Pupa, DO ?Attending [place], Faculty Practice ?[facility] ? ? ?

## 2021-11-29 NOTE — MAU Note (Signed)
.  Denise Brandt is a 41 y.o. at 25w3dhere in MAU reporting: HBP at home, 149/52 and pelvic pain that she describes as pressure and discomfort. Reports irregular ctx only after going to the restroom 7/10. Reports that she has pain when needing to urinate, she describes the feeling of having the urge to go, but not being able to. She has had a HA for the past two days 5/10. Reports recent cocaine use. Denies VB or LOF. Endorses +FM.  ? ?Onset of complaint: Pain started yesterday ? ?Vitals:  ? 11/29/21 0316  ?Pulse: 74  ?Resp: 20  ?Temp: 98 ?F (36.7 ?C)  ?SpO2: 97%  ?   ?FHT:135 ?Lab orders placed from triage:  UA ? ?

## 2021-11-30 ENCOUNTER — Encounter (HOSPITAL_COMMUNITY): Payer: Self-pay | Admitting: Obstetrics and Gynecology

## 2021-11-30 LAB — CBC
HCT: 26 % — ABNORMAL LOW (ref 36.0–46.0)
Hemoglobin: 8.5 g/dL — ABNORMAL LOW (ref 12.0–15.0)
MCH: 30.7 pg (ref 26.0–34.0)
MCHC: 32.7 g/dL (ref 30.0–36.0)
MCV: 93.9 fL (ref 80.0–100.0)
Platelets: 186 10*3/uL (ref 150–400)
RBC: 2.77 MIL/uL — ABNORMAL LOW (ref 3.87–5.11)
RDW: 15.1 % (ref 11.5–15.5)
WBC: 8.8 10*3/uL (ref 4.0–10.5)
nRBC: 0 % (ref 0.0–0.2)

## 2021-11-30 MED ORDER — NIFEDIPINE ER OSMOTIC RELEASE 30 MG PO TB24: 30.0000 mg | ORAL_TABLET | Freq: Every day | ORAL | Status: DC

## 2021-11-30 MED ORDER — LACTATED RINGERS IV BOLUS
500.0000 mL | Freq: Once | INTRAVENOUS | Status: AC
  Administered 2021-11-30: 500 mL via INTRAVENOUS

## 2021-11-30 MED ORDER — SODIUM CHLORIDE 0.9 % IV SOLN
500.0000 mg | Freq: Once | INTRAVENOUS | Status: AC
  Administered 2021-11-30: 500 mg via INTRAVENOUS
  Filled 2021-11-30: qty 25
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
## 2021-11-30 NOTE — Progress Notes (Signed)
Attempted to get pt up and do orthos. Pt got dizzy. I told pt I would attempt to get her up to ambulate in 4 hours.  ?

## 2021-11-30 NOTE — Addendum Note (Signed)
Addendum  created 11/30/21 0055 by Rande Brunt, CRNA  ? Intraprocedure Event edited, Intraprocedure Staff edited  ?  ?

## 2021-11-30 NOTE — Addendum Note (Signed)
Addendum  created 11/30/21 0036 by Rande Brunt, CRNA  ? Intraprocedure Staff edited  ?  ?

## 2021-11-30 NOTE — Progress Notes (Signed)
Informed MD that pt BP's are elevated. He said that we would monitor them and if they become more elevated we may need to give lasix. I will continue to monitor and alert MD prn.  ?

## 2021-11-30 NOTE — Progress Notes (Signed)
Post Operative Day 1 ?Subjective: ?Doing well. No acute events overnight. Pain is controlled and bleeding is appropriate. She has some bloating. She is eating, drinking, and ambulating without issue. Her foley catheter is still in place this morning. She is formula feeding well which is going well. She has no other concerns at this time. ? ?Objective: ?Blood pressure 115/69, pulse 60, temperature 97.8 ?F (36.6 ?C), temperature source Oral, resp. rate 17, height '5\' 4"'$  (1.626 m), weight 77.6 kg, SpO2 99 %, unknown if currently breastfeeding. ? ?Physical Exam:  ?General: alert, cooperative, and no distress ?Lochia: appropriate ?Uterine Fundus: firm and below umbilicus  ?Incision: pressure dressing in place, clean and intact  ?DVT Evaluation: no LE edema or calf tenderness to palpation  ? ?Recent Labs  ?  11/29/21 ?1219 11/30/21 ?0512  ?HGB 11.9* 8.5*  ?HCT 35.2* 26.0*  ? ? ?Assessment/Plan: ?Denise Brandt is a 41 y.o. P6P9509 on POD# 1 s/p rLTCS. ? ?Progressing well. Meeting postpartum milestones. VSS. Continue routine postpartum care. ? ?Plan to remove foley catheter this AM. Will also remove pressure dressing at some point today. ? ?#Acute blood loss anemia:  ?- Hgb 11.9>8.5 post-operatively ?- No symptoms at this time ?- IV Venofer ordered  ? ?#Gestational HTN: ?- BP intermittently mild range post-delivery ?- Now within normal limits, no symptoms of pre-eclampsia  ?- Lasix ordered to be started today ?- Will continue to monitor closely and add anti-hypertensive medication as needed  ? ?#Hx of cocaine use: ?- SW consulted ?- Will follow up recommendations ? ?Feeding: Formula  ?Contraception: Declines  ?Circumcision: Declines  ? ?Dispo: Plan for discharge on POD#2-3.  ? ? LOS: 1 day  ? ?Genia Del, MD  ?11/30/2021, 1:28 PM  ? ? ?

## 2021-12-01 MED ORDER — NALOXONE HCL 0.4 MG/ML IJ SOLN: 0.4000 mg | INTRAMUSCULAR | Status: DC | PRN

## 2021-12-01 MED ORDER — ONDANSETRON HCL 4 MG/2ML IJ SOLN: 4.0000 mg | Freq: Three times a day (TID) | INTRAMUSCULAR | Status: DC | PRN

## 2021-12-01 MED ORDER — DIPHENHYDRAMINE HCL 25 MG PO CAPS: 25.0000 mg | ORAL_CAPSULE | ORAL | Status: DC | PRN

## 2021-12-01 MED ORDER — SODIUM CHLORIDE 0.9% FLUSH: 3.0000 mL | INTRAVENOUS | Status: DC | PRN

## 2021-12-01 MED ORDER — OLANZAPINE 5 MG PO TBDP
5.0000 mg | ORAL_TABLET | Freq: Once | ORAL | Status: DC
  Filled 2021-12-01: qty 1

## 2021-12-01 MED ORDER — NALOXONE HCL 4 MG/10ML IJ SOLN
1.0000 ug/kg/h | INTRAVENOUS | Status: DC | PRN
  Filled 2021-12-01: qty 5

## 2021-12-01 MED ORDER — SCOPOLAMINE 1 MG/3DAYS TD PT72: 1.0000 | MEDICATED_PATCH | Freq: Once | TRANSDERMAL | Status: DC

## 2021-12-01 MED ORDER — DIPHENHYDRAMINE HCL 50 MG/ML IJ SOLN: 12.5000 mg | INTRAMUSCULAR | Status: DC | PRN

## 2021-12-01 NOTE — Progress Notes (Signed)
Patient questioning new medication orders placed this morning and requesting a MD to discuss them with her. MD notified and told that orders were placed by psych and that psych will be by to see her later. Michela Pitcher it is okay to hold off on medications until psych assesses. Will continue to monitor. Timoteo Ace, RN ? ?

## 2021-12-01 NOTE — Progress Notes (Addendum)
Denise Brandt [id number] ?Postpartum Day 2 ?S/P Repeat Cesarean Section due to history of C/S and patient desire. ? ?Subjective: ?Patient up ad lib, denies syncope or dizziness. Reports consuming regular diet without issues and denies N/V. Patient reports small bowel movement and is passing flatus.  Denies issues with urination and reports bleeding is "small."  Patient is bottlefeeding and reports going well.  Desires no method for postpartum contraception.  Pain is being managed with use of oxycodone and patient rates it a 7-8/10.  ? ?Objective: ?Temp:  [97.8 ?F (36.6 ?C)-98.4 ?F (36.9 ?C)] 98 ?F (36.7 ?C) (04/16 0500) ?Pulse Rate:  [60-66] 66 (04/16 0500) ?Resp:  [17-18] 17 (04/16 0500) ?BP: (109-135)/(69-81) 135/80 (04/16 0500) ?SpO2:  [99 %-100 %] 100 % (04/16 0500) ? ?Recent Labs  ?  11/29/21 ?0353 11/29/21 ?1219 11/30/21 ?0512  ?HGB 11.1* 11.9* 8.5*  ?HCT 33.7* 35.2* 26.0*  ?WBC 5.2 6.3 8.8  ? ? ?Physical Exam:  ?General: alert, cooperative, and no distress ?Mood/Affect: Energetic/Manic ?Lungs: clear to auscultation, no wheezes, rales or rhonchi, symmetric air entry.  ?Heart: normal rate and regular rhythm. ?Breast: not examined. ?Abdomen:  Soft, NT ?Incision: Significant drainage on Honeycomb dressing-Intact ?Uterine Fundus: firm ?Lochia: appropriate ?Skin: Warm, Dry. ?DVT Evaluation: No evidence of DVT seen on physical exam. ?No significant calf/ankle edema. ?JP drain:   None ? ?Assessment ?Post Operative Day 2 ?S/P Repeat C/S ?Normal Involution ?BottleFeeding ?Hemodynamically Stable ? ?Plan: ?-Discussed inability for d/c today d/t recent drug use and CSW involvement. ?-Patient aware of barrier to discharge. ?-Discussed potential discharge tomorrow. ?-S/P Venofer Infusion x 1.  ?-Continue other mgmt as ordered. ?-L&D team to be updated on patient status. ? ?Maryann Conners MSN, CNM ?12/01/2021, 6:31 AM ? ?Reassessment (8:37 AM) ?-Patient mood and affect discussed in rounds. ?-Dr. Sheryn Bison advises place  psych consult and call. ?-Consult placed and oncall provider contacted. ?-Advised Olanzapine '5mg'$  disintegrating tablet now and will be around to see patient this afternoon.  ?-Order placed ?-Message sent to oncoming provider with update. ? ?Maryann Conners MSN, CNM ?Advanced Practice Provider, Center for [facility] ? ?

## 2021-12-01 NOTE — Progress Notes (Signed)
Dr. Dwyane Dee attempted to meet with patient at bedside and patient did not want to speak to her so she is going to reassess tomorrow. Patient is calm and cooperative and taking care of infant appropriately at this time. Verbally ordered to D/C mood stabilizing medications at were ordered this AM. Will continue to monitor patients behavior.  ? ?CPS currently at bedside at time of attempted psych consult. Timoteo Ace, RN ? ? ?

## 2021-12-01 NOTE — Consult Note (Addendum)
?  Psychiatric consult service ? ?Patient was seen, stated that she did not want to see psychiatry at this time as she was talking to the [place] caseworker.  Patient added that she needed to get into mood to be able to talk to psychiatry, was not willing at this time.  Discussed with patient's provider is alert and oriented, is not hallucinating, is not paranoid.  Consult service will see patient tomorrow if she is still in the hospital.  Both patient and the baby were positive for cocaine, patient does not have custody of her other children and most likely the baby is going to be in [place] custody.  Patient is also not suicidal, homicidal, has an extensive history of substance use disorder ? ?Patient does not require Zyprexa at this time, was noted to be calm, does want to control and be informed of decisions in regards to her and her child. ?

## 2021-12-01 NOTE — Progress Notes (Signed)
Patients BP is high with 3 consecutive readings. Patient reports abdominal cramps so she was medicated and heat packs were placed. Will recheck BP and notify if BP remains high. Timoteo Ace, RN ? ?

## 2021-12-02 ENCOUNTER — Other Ambulatory Visit (HOSPITAL_COMMUNITY): Payer: Self-pay

## 2021-12-02 MED ORDER — IBUPROFEN 600 MG PO TABS
600.0000 mg | ORAL_TABLET | Freq: Four times a day (QID) | ORAL | 0 refills | Status: DC
Start: 2021-12-02 — End: 2022-10-21
  Filled 2021-12-02: qty 30, 8d supply, fill #0

## 2021-12-02 MED ORDER — ACETAMINOPHEN 325 MG RE SUPP
325.0000 mg | RECTAL | 0 refills | Status: DC | PRN
  Filled 2021-12-02: qty 30, 5d supply, fill #0

## 2021-12-02 MED ORDER — NIFEDIPINE ER 30 MG PO TB24
30.0000 mg | ORAL_TABLET | Freq: Every day | ORAL | 2 refills | Status: DC
  Filled 2021-12-02: qty 30, 30d supply, fill #0

## 2021-12-02 MED ORDER — OXYCODONE HCL 5 MG PO TABS
5.0000 mg | ORAL_TABLET | ORAL | 0 refills | Status: DC | PRN
  Filled 2021-12-02: qty 20, 3d supply, fill #0

## 2021-12-02 MED ORDER — FUROSEMIDE 20 MG PO TABS
20.0000 mg | ORAL_TABLET | Freq: Every day | ORAL | 0 refills | Status: DC
  Filled 2021-12-02: qty 2, 2d supply, fill #0

## 2021-12-02 MED ORDER — POLYETHYLENE GLYCOL 3350 17 GM/SCOOP PO POWD
17.0000 g | Freq: Every day | ORAL | 1 refills | Status: DC | PRN
  Filled 2021-12-02: qty 238, 14d supply, fill #0

## 2021-12-02 NOTE — Social Work (Signed)
Per [place] social worker Jamelle Haring [phone] a Child Family Team meeting will be held tomorrow at 10:00 am at the hospital to determine the infant's discharge plan.  ? ?Barriers to infant's discharge.  ? ?Kathrin Greathouse, MSW, LCSW ?[facility] [place]  ?Clinical Social Worker  ?[phone] ?12/02/2021  11:14 AM  ?

## 2021-12-02 NOTE — Progress Notes (Signed)
Denise Brandt came out of patients room requesting someone to come in and check patient.  Upon arriving in the room, pt was in the bed lying on her left side c/o tightness in her chest and feeling like she was having difficulty breathing. BP = 153/92 P=72 RR=17 Sp02= 99%.  Pt stated she does have a history of anxiety and it felt similar to anxiety attacks in the past.  Reviewed relaxation techniques with the patient and informed her that I would notify provider to see about prescribing her something to help with anxiety. Pt acknowledged plan.  ?

## 2021-12-02 NOTE — Progress Notes (Addendum)
Received a call from [place] that patient was complaining of chest pain, mentions that she feels it is similar to anxiety attacks she has had in the past.  Nursing worked on breathing and relaxation techniques and this seemed to improved her symptoms.  Went to assess patient and she was resting comfortably, mentions that she feels her breasts are engorged and that she is really constipated and that also could be contributing to her symptoms. Does feel like her anxiety is contributory and does feel a little better now. Patient had received her meds to beds which included Miralax.  Helped patient mix a capful of Miralax into some gingerale to help with her constipation.   ? ?Physical Exam ?Cardiac: RRR ?Pulmonary: normal work of breathing, CTA bilaterally  ? ?Markus Daft, DO, PGY-1 ?12/02/2021 ? ?  ?GME ATTESTATION:  ?I saw and evaluated the patient. I agree with the findings and the plan of care as documented in the resident?s note. ? ?Renard Matter, MD, MPH ?OB Fellow, Faculty Practice ?[place] for [facility] ?12/02/2021 6:45 PM ? ?

## 2021-12-03 ENCOUNTER — Encounter: Payer: Medicaid Other | Admitting: Obstetrics & Gynecology

## 2021-12-03 LAB — SURGICAL PATHOLOGY

## 2021-12-03 NOTE — Social Work (Signed)
CPS is actively working to determine a disposition for the infant. CPS agreed to provide updates when a discharge plan is decided.  ? ?CSW provided updated to the pediatrician. ? ?Kathrin Greathouse, MSW, LCSW ?[facility] [place]  ?Clinical Social Worker  ?[phone] ?12/03/2021  12:25 PM  ?

## 2021-12-06 ENCOUNTER — Inpatient Hospital Stay (HOSPITAL_COMMUNITY): Payer: Medicaid Other

## 2021-12-06 ENCOUNTER — Inpatient Hospital Stay (HOSPITAL_COMMUNITY)
Admission: AD | Admit: 2021-12-06 | Payer: Medicaid Other | Source: Home / Self Care | Admitting: Obstetrics & Gynecology

## 2021-12-06 ENCOUNTER — Ambulatory Visit: Payer: Medicaid Other

## 2021-12-07 ENCOUNTER — Encounter (HOSPITAL_COMMUNITY): Payer: Self-pay | Admitting: *Deleted

## 2021-12-07 ENCOUNTER — Emergency Department (HOSPITAL_COMMUNITY): Payer: Medicaid Other

## 2021-12-07 ENCOUNTER — Emergency Department (HOSPITAL_COMMUNITY)
Admission: EM | Admit: 2021-12-07 | Discharge: 2021-12-07 | Disposition: A | Discharge status: Home / Self Care | Payer: Medicaid Other | Attending: Emergency Medicine | Admitting: Emergency Medicine

## 2021-12-07 ENCOUNTER — Other Ambulatory Visit: Payer: Self-pay

## 2021-12-07 DIAGNOSIS — R0602 Shortness of breath: Secondary | ICD-10-CM | POA: Diagnosis not present

## 2021-12-07 DIAGNOSIS — O9089 Other complications of the puerperium, not elsewhere classified: Secondary | ICD-10-CM | POA: Diagnosis not present

## 2021-12-07 DIAGNOSIS — R079 Chest pain, unspecified: Secondary | ICD-10-CM | POA: Diagnosis not present

## 2021-12-07 DIAGNOSIS — R1030 Lower abdominal pain, unspecified: Secondary | ICD-10-CM | POA: Diagnosis not present

## 2021-12-07 DIAGNOSIS — R109 Unspecified abdominal pain: Secondary | ICD-10-CM | POA: Insufficient documentation

## 2021-12-07 DIAGNOSIS — K5900 Constipation, unspecified: Secondary | ICD-10-CM | POA: Diagnosis not present

## 2021-12-07 DIAGNOSIS — R1110 Vomiting, unspecified: Secondary | ICD-10-CM | POA: Diagnosis not present

## 2021-12-07 DIAGNOSIS — R9431 Abnormal electrocardiogram [ECG] [EKG]: Secondary | ICD-10-CM | POA: Diagnosis not present

## 2021-12-07 DIAGNOSIS — O902 Hematoma of obstetric wound: Secondary | ICD-10-CM

## 2021-12-07 LAB — CBC
HCT: 31.7 % — ABNORMAL LOW (ref 36.0–46.0)
Hemoglobin: 10.4 g/dL — ABNORMAL LOW (ref 12.0–15.0)
MCH: 30.8 pg (ref 26.0–34.0)
MCHC: 32.8 g/dL (ref 30.0–36.0)
MCV: 93.8 fL (ref 80.0–100.0)
Platelets: 413 10*3/uL — ABNORMAL HIGH (ref 150–400)
RBC: 3.38 MIL/uL — ABNORMAL LOW (ref 3.87–5.11)
RDW: 15 % (ref 11.5–15.5)
WBC: 7 10*3/uL (ref 4.0–10.5)
nRBC: 0 % (ref 0.0–0.2)

## 2021-12-07 LAB — COMPREHENSIVE METABOLIC PANEL
ALT: 26 U/L (ref 0–44)
AST: 21 U/L (ref 15–41)
Albumin: 3 g/dL — ABNORMAL LOW (ref 3.5–5.0)
Alkaline Phosphatase: 101 U/L (ref 38–126)
Anion gap: 10 (ref 5–15)
BUN: 14 mg/dL (ref 6–20)
CO2: 24 mmol/L (ref 22–32)
Calcium: 8.3 mg/dL — ABNORMAL LOW (ref 8.9–10.3)
Chloride: 102 mmol/L (ref 98–111)
Creatinine, Ser: 0.65 mg/dL (ref 0.44–1.00)
GFR, Estimated: >60 mL/min (ref >60)
Glucose, Bld: 95 mg/dL (ref 70–99)
Potassium: 4.2 mmol/L (ref 3.5–5.1)
Sodium: 136 mmol/L (ref 135–145)
Total Bilirubin: 1.1 mg/dL (ref 0.3–1.2)
Total Protein: 6.4 g/dL — ABNORMAL LOW (ref 6.5–8.1)

## 2021-12-07 LAB — URINALYSIS, ROUTINE W REFLEX MICROSCOPIC
Bacteria, UA: NONE SEEN
Bilirubin Urine: NEGATIVE
Glucose, UA: NEGATIVE mg/dL
Ketones, ur: NEGATIVE mg/dL
Nitrite: NEGATIVE
Protein, ur: NEGATIVE mg/dL
Specific Gravity, Urine: 1.034 — ABNORMAL HIGH (ref 1.005–1.030)
pH: 6 (ref 5.0–8.0)

## 2021-12-07 LAB — POC OCCULT BLOOD, ED: Fecal Occult Bld: NEGATIVE

## 2021-12-07 LAB — LIPASE, BLOOD: Lipase: 26 U/L (ref 11–51)

## 2021-12-07 MED ORDER — SODIUM CHLORIDE 0.9 % IV SOLN: INTRAVENOUS | Status: DC

## 2021-12-07 MED ORDER — SODIUM CHLORIDE 0.9 % IV BOLUS
1000.0000 mL | Freq: Once | INTRAVENOUS | Status: AC
  Administered 2021-12-07: 1000 mL via INTRAVENOUS

## 2021-12-07 MED ORDER — IOHEXOL 350 MG/ML SOLN
100.0000 mL | Freq: Once | INTRAVENOUS | Status: AC | PRN
  Administered 2021-12-07: 100 mL via INTRAVENOUS

## 2021-12-07 NOTE — ED Provider Notes (Signed)
?[place] ?Provider Note ? ? ?CSN: [id number] ?Arrival date & time: 12/07/21  2355 ? ?  ? ?History ? ?Chief Complaint  ?Patient presents with  ?? Abdominal Pain  ? ? ?Denise Brandt is a 41 y.o. female. ? ?Patient is multigravida.  Patient status post C-section on April 14 discharged home on April 17.  C-section done by Dr. Suzzanne Cloud and patient's outpatient follow-up with was to be with Dr. Gwenlyn Perking.  Patient returns here with multiple complaints 1 is that she thinks she is constipated.  States she has not had a bowel movement since prior to the C-section.  She has abdominal pain as well she has chest pain and shortness of breath.  And breast engorgement.  I did discuss with [facility].  But since patient already had a long wait in our waiting area I will just work her up here.  Because they did not make the referral early. ? ? ?  ? ?Home Medications ?Prior to Admission medications   ?Medication Sig Start Date End Date Taking? Authorizing Provider  ?acetaminophen (TYLENOL) 325 MG suppository Place 1 suppository (325 mg total) rectally every 4 (four) hours as needed. 12/02/21   Clarnce Flock, MD  ?Blood Pressure Monitoring (BLOOD PRESSURE KIT) DEVI 1 Device by Does not apply route as needed. ?Patient not taking: Reported on 11/27/2021 09/16/21   Elvera Maria, CNM  ?ferrous sulfate 325 (65 FE) MG EC tablet Take 1 tablet (325 mg total) by mouth every other day. 09/10/21   Laury Deep, CNM  ?furosemide (LASIX) 20 MG tablet Take 1 tablet (20 mg total) by mouth daily. 12/03/21   Clarnce Flock, MD  ?ibuprofen (ADVIL) 600 MG tablet Take 1 tablet (600 mg total) by mouth every 6 (six) hours. 12/02/21   Clarnce Flock, MD  ?Misc. Devices (GOJJI WEIGHT SCALE) MISC 1 Device by Does not apply route as needed. ?Patient not taking: Reported on 11/27/2021 09/16/21   Elvera Maria, CNM  ?NIFEdipine (ADALAT CC) 30 MG 24 hr tablet Take 1 tablet (30 mg total) by mouth  daily. 12/02/21   Clarnce Flock, MD  ?oxyCODONE (OXY IR/ROXICODONE) 5 MG immediate release tablet Take 1-2 tablets (5-10 mg total) by mouth every 4 (four) hours as needed for moderate pain. 12/02/21   Clarnce Flock, MD  ?polyethylene glycol powder (GLYCOLAX/MIRALAX) 17 GM/SCOOP powder Take 17 g by mouth daily as needed. 12/02/21   Clarnce Flock, MD  ?Prenatal Vit-Fe Fumarate-FA (PRENATAL PLUS VITAMIN/MINERAL) 27-1 MG TABS Take 1 tablet by mouth daily. 09/16/21   Leftwich-Kirby, Kathie Dike, CNM  ?   ? ?Allergies    ?Patient has no known allergies.   ? ?Review of Systems   ?Review of Systems  ?Constitutional:  Negative for chills and fever.  ?HENT:  Negative for congestion, ear pain and sore throat.   ?Eyes:  Negative for pain and visual disturbance.  ?Respiratory:  Positive for shortness of breath. Negative for cough.   ?Cardiovascular:  Positive for chest pain. Negative for palpitations and leg swelling.  ?Gastrointestinal:  Positive for abdominal pain and constipation. Negative for vomiting.  ?Genitourinary:  Negative for dysuria and hematuria.  ?Musculoskeletal:  Negative for arthralgias and back pain.  ?Skin:  Negative for color change and rash.  ?Neurological:  Negative for seizures and syncope.  ?All other systems reviewed and are negative. ? ?Physical Exam ?Updated Vital Signs ?BP (!) 147/109   Pulse 83   Temp 98.4 ?F (36.9 ?C) (  Oral)   Resp (!) 21   Ht 1.626 m (_0 )   Wt 77.6 kg   SpO2 100%   Breastfeeding Unknown   BMI 29.37 kg/m?  ?Physical Exam ?Vitals and nursing note reviewed.  ?Constitutional:   ?   General: She is not in acute distress. ?   Appearance: Normal appearance. She is well-developed. She is not ill-appearing.  ?HENT:  ?   Head: Normocephalic and atraumatic.  ?Eyes:  ?   Extraocular Movements: Extraocular movements intact.  ?   Conjunctiva/sclera: Conjunctivae normal.  ?   Pupils: Pupils are equal, round, and reactive to light.  ?Cardiovascular:  ?   Rate and Rhythm: Normal  rate and regular rhythm.  ?   Heart sounds: No murmur heard. ?Pulmonary:  ?   Effort: Pulmonary effort is normal. No respiratory distress.  ?   Breath sounds: Normal breath sounds. No wheezing or rales.  ?   Comments: Breast engorged but no erythema nontender. ?Abdominal:  ?   Palpations: Abdomen is soft.  ?   Tenderness: There is abdominal tenderness.  ?   Comments: Most of the tenderness is near the C-section incision.  Which appears to be healing well.  No evidence of any infection.  ?Genitourinary: ?   Comments: Patient still with bleeding from the vaginal area.  Dark blood.  Perianal area with some skin tags but no external hemorrhoids no prolapsed internal hemorrhoids fair amount of stool in the rectal vault.  Not able to disimpact it.  Patient was experiencing a lot of discomfort.  Was able to break the stool up.  Stool was light brown in color.  Not grossly bloody.  However Hemoccult may be positive because of the vaginal bleeding. ?Musculoskeletal:     ?   General: No swelling.  ?   Cervical back: Neck supple.  ?   Right lower leg: No edema.  ?   Left lower leg: No edema.  ?Skin: ?   General: Skin is warm and dry.  ?   Capillary Refill: Capillary refill takes less than 2 seconds.  ?Neurological:  ?   General: No focal deficit present.  ?   Mental Status: She is alert and oriented to person, place, and time.  ?Psychiatric:     ?   Mood and Affect: Mood normal.  ? ? ?ED Results / Procedures / Treatments   ?Labs ?(all labs ordered are listed, but only abnormal results are displayed) ?Labs Reviewed  ?COMPREHENSIVE METABOLIC PANEL - Abnormal; Notable for the following components:  ?    Result Value  ? Calcium 8.3 (*)   ? Total Protein 6.4 (*)   ? Albumin 3.0 (*)   ? All other components within normal limits  ?CBC - Abnormal; Notable for the following components:  ? RBC 3.38 (*)   ? Hemoglobin 10.4 (*)   ? HCT 31.7 (*)   ? Platelets 413 (*)   ? All other components within normal limits  ?URINALYSIS, ROUTINE W  REFLEX MICROSCOPIC - Abnormal; Notable for the following components:  ? Specific Gravity, Urine 1.034 (*)   ? Hgb urine dipstick LARGE (*)   ? Leukocytes,Ua MODERATE (*)   ? All other components within normal limits  ?LIPASE, BLOOD  ?POC OCCULT BLOOD, ED  ? ? ?EKG ?EKG Interpretation ? ?Date/Time:  Saturday December 07 2021 10:56:38 EDT ?Ventricular Rate:  61 ?PR Interval:  157 ?QRS Duration: 85 ?QT Interval:  460 ?QTC Calculation: 464 ?R Axis:   88 ?  Text Interpretation: Sinus rhythm Low voltage, precordial leads Confirmed by Fredia Sorrow [id number]) on 12/07/2021 1:56:30 PM ? ?Radiology ?CT Angio Chest PE W/Cm &/Or Wo Cm ? ?Result Date: 12/07/2021 ?CLINICAL DATA:  Chest pain and shortness of breath. Abdominal pain and vomiting. Eight days status post C-section delivery. EXAM: CT ANGIOGRAPHY CHEST CT ABDOMEN AND PELVIS WITH CONTRAST TECHNIQUE: Multidetector CT imaging of the chest was performed using the standard protocol during bolus administration of intravenous contrast. Multiplanar CT image reconstructions and MIPs were obtained to evaluate the vascular anatomy. Multidetector CT imaging of the abdomen and pelvis was performed using the standard protocol during bolus administration of intravenous contrast. RADIATION DOSE REDUCTION: This exam was performed according to the departmental dose-optimization program which includes automated exposure control, adjustment of the mA and/or kV according to patient size and/or use of iterative reconstruction technique. CONTRAST:  11m OMNIPAQUE IOHEXOL 350 MG/ML SOLN COMPARISON:  None. FINDINGS: CTA CHEST FINDINGS Cardiovascular: Satisfactory opacification of pulmonary arteries noted, and no pulmonary emboli identified. No evidence of thoracic aortic aneurysm. Mediastinum/Nodes: No masses or pathologically enlarged lymph nodes identified. Lungs/Pleura: No pulmonary mass, infiltrate, or effusion. Musculoskeletal: No suspicious bone lesions identified. Review of the MIP images  confirms the above findings. CT ABDOMEN and PELVIS FINDINGS Image degradation by motion artifact noted. Hepatobiliary: No hepatic masses identified. Gallbladder is unremarkable. No evidence of biliary ductal dilatation.
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
## 2021-12-07 NOTE — Discharge Instructions (Signed)
Increase your MiraLAX.  Keep your appointment to follow-up with Dr. Gwenlyn Perking.  As we discussed you do have the bladder flap hematoma.  Discussed with Dr. Elgie Congo and this should reabsorb.  Work-up otherwise without any acute findings. ?

## 2021-12-07 NOTE — ED Notes (Signed)
Patient transported to CT 

## 2021-12-09 ENCOUNTER — Telehealth (HOSPITAL_COMMUNITY): Payer: Self-pay | Admitting: *Deleted

## 2021-12-09 DIAGNOSIS — Z1331 Encounter for screening for depression: Secondary | ICD-10-CM

## 2021-12-09 NOTE — Telephone Encounter (Signed)
Mom was tearful and reports she is having postpartum depression. Nurse asked about any medication or counseling she may be receiving now, but mom reports none and is not sure she wants any help. Nurse asked about repeating the EPDS as hospital score was 15. Mom declined saying 'it would be even higher now'. Nurse offered to send Maternal Mental Health resource list via and mom agreed. Nurse asked if mom had thoughts of hurting herself. She didn't respond to the question, but quickly said she didn't want to talk anymore and had to get off the phone before hanging up. [facility] referral made and Dr. Si Raider notified. Maternal Mental Health resources sent to mom via email. ? ?Odis Hollingshead, RN 12-09-2021 at 3:36pm ?

## 2021-12-16 DIAGNOSIS — Z419 Encounter for procedure for purposes other than remedying health state, unspecified: Secondary | ICD-10-CM | POA: Diagnosis not present

## 2021-12-26 ENCOUNTER — Ambulatory Visit (INDEPENDENT_AMBULATORY_CARE_PROVIDER_SITE_OTHER): Payer: Medicaid Other | Admitting: Licensed Clinical Social Worker

## 2021-12-26 ENCOUNTER — Telehealth: Payer: Self-pay

## 2021-12-26 DIAGNOSIS — Z1331 Encounter for screening for depression: Secondary | ICD-10-CM | POA: Diagnosis not present

## 2021-12-26 DIAGNOSIS — F53 Postpartum depression: Secondary | ICD-10-CM | POA: Diagnosis not present

## 2021-12-26 NOTE — Telephone Encounter (Signed)
Received call from family connects nurse that patient scored a 72 on Edinburgh. Advised in office counselor, Seth Bake, who attempted to contact the patient with no answer. Pt is scheduled to follow up on 01/03/22 for a pp and behavioral health visit. ?

## 2021-12-27 ENCOUNTER — Telehealth: Payer: Self-pay

## 2021-12-27 NOTE — Telephone Encounter (Signed)
In office counselor Seth Bake) made contact with patient yesterday to assess after elevated edinburgh, next appt is 01/22/22 for pp ?

## 2021-12-30 NOTE — BH Specialist Note (Signed)
Integrated Behavioral Health via Telemedicine Visit ? ?12/30/2021 ?Denise Brandt ?[id number] ? ?Number of [place] Clinician visits: 1 ?Session Start time:  334pm ?Session End time: 357pm ?Total time in minutes: 23 mins via phone  ? ?Referring Provider: Christiana Pellant  ?Patient/Family location: Home  ?[facility] Provider location: [place]  ?All persons participating in visit: D. Brandt and LCSW A. Linton Rump  ?Types of Service: Individual psychotherapy ? ?I connected with Denise Brandt and/or Denise Brandt's n/a via  Telephone or Geologist, engineering  (Video is Caregility application) and verified that I am speaking with the correct person using two identifiers. Discussed confidentiality: Yes  ? ?I discussed the limitations of telemedicine and the availability of in person appointments.  Discussed there is a possibility of technology failure and discussed alternative modes of communication if that failure occurs. ? ?I discussed that engaging in this telemedicine visit, they consent to the provision of behavioral healthcare and the services will be billed under their insurance. ? ?Patient and/or legal guardian expressed understanding and consented to Telemedicine visit: Yes  ? ?Presenting Concerns: ?Patient and/or family reports the following symptoms/concerns: positive depression screen  ?Duration of problem: over one year; Severity of problem: mild ? ?Patient and/or Family's Strengths/Protective Factors: ?Sense of purpose ? ?Goals Addressed: ?Patient will: ? Reduce symptoms of: depression  ? Increase knowledge and/or ability of: coping skills and healthy habits  ? Demonstrate ability to: Increase motivation to adhere to plan of care ? ?Progress towards Goals: ?Ongoing ? ?Interventions: ?Interventions utilized:  Supportive Counseling ?Standardized Assessments completed: Edinburgh Postnatal Depression ? ?Patient and/or Family Response: Ms. Denise Brandt   ? ?Assessment: ?Patient currently experiencing postpartum depression.  ? ?Patient may benefit from outpatient behavioral health. ? ?Plan: ?Follow up with behavioral health clinician on : as needed  ?Behavioral recommendations: Follow up with prefer behavioral health provider, take prescribed medication as directed, prioritize rest and engage in self care  ?Referral(s): [facility] (In Clinic) ? ?I discussed the assessment and treatment plan with the patient and/or parent/guardian. They were provided an opportunity to ask questions and all were answered. They agreed with the plan and demonstrated an understanding of the instructions. ?  ?They were advised to call back or seek an in-person evaluation if the symptoms worsen or if the condition fails to improve as anticipated. ? ?Lynnea Ferrier, LCSW ?

## 2022-01-03 ENCOUNTER — Ambulatory Visit: Payer: Medicaid Other | Admitting: Obstetrics and Gynecology

## 2022-01-03 ENCOUNTER — Institutional Professional Consult (permissible substitution): Payer: Medicaid Other | Admitting: Licensed Clinical Social Worker

## 2022-01-16 DIAGNOSIS — Z419 Encounter for procedure for purposes other than remedying health state, unspecified: Secondary | ICD-10-CM | POA: Diagnosis not present

## 2022-01-22 ENCOUNTER — Ambulatory Visit: Payer: Medicaid Other | Admitting: Obstetrics

## 2022-01-22 ENCOUNTER — Encounter: Payer: Self-pay | Admitting: Medical

## 2022-01-23 DIAGNOSIS — F331 Major depressive disorder, recurrent, moderate: Secondary | ICD-10-CM | POA: Diagnosis not present

## 2022-02-15 DIAGNOSIS — Z419 Encounter for procedure for purposes other than remedying health state, unspecified: Secondary | ICD-10-CM | POA: Diagnosis not present

## 2022-02-27 ENCOUNTER — Ambulatory Visit: Payer: Medicaid Other | Admitting: Obstetrics and Gynecology

## 2022-02-27 ENCOUNTER — Ambulatory Visit: Payer: Medicaid Other | Admitting: Obstetrics

## 2022-03-18 DIAGNOSIS — Z419 Encounter for procedure for purposes other than remedying health state, unspecified: Secondary | ICD-10-CM | POA: Diagnosis not present

## 2022-04-18 DIAGNOSIS — Z419 Encounter for procedure for purposes other than remedying health state, unspecified: Secondary | ICD-10-CM | POA: Diagnosis not present

## 2022-05-18 DIAGNOSIS — Z419 Encounter for procedure for purposes other than remedying health state, unspecified: Secondary | ICD-10-CM | POA: Diagnosis not present

## 2022-06-18 DIAGNOSIS — Z419 Encounter for procedure for purposes other than remedying health state, unspecified: Secondary | ICD-10-CM | POA: Diagnosis not present

## 2022-07-18 DIAGNOSIS — Z419 Encounter for procedure for purposes other than remedying health state, unspecified: Secondary | ICD-10-CM | POA: Diagnosis not present

## 2022-07-28 ENCOUNTER — Encounter (HOSPITAL_COMMUNITY): Payer: Self-pay

## 2022-07-28 ENCOUNTER — Other Ambulatory Visit: Payer: Self-pay

## 2022-07-28 ENCOUNTER — Emergency Department (HOSPITAL_COMMUNITY): Payer: Medicaid Other

## 2022-07-28 ENCOUNTER — Emergency Department (HOSPITAL_COMMUNITY)
Admission: EM | Admit: 2022-07-28 | Discharge: 2022-07-28 | Disposition: A | Discharge status: Home / Self Care | Payer: Medicaid Other | Attending: Emergency Medicine | Admitting: Emergency Medicine

## 2022-07-28 DIAGNOSIS — M25521 Pain in right elbow: Secondary | ICD-10-CM | POA: Insufficient documentation

## 2022-07-28 DIAGNOSIS — K644 Residual hemorrhoidal skin tags: Secondary | ICD-10-CM | POA: Insufficient documentation

## 2022-07-28 DIAGNOSIS — R799 Abnormal finding of blood chemistry, unspecified: Secondary | ICD-10-CM | POA: Insufficient documentation

## 2022-07-28 DIAGNOSIS — S50351A Superficial foreign body of right elbow, initial encounter: Secondary | ICD-10-CM | POA: Diagnosis not present

## 2022-07-28 DIAGNOSIS — N9489 Other specified conditions associated with female genital organs and menstrual cycle: Secondary | ICD-10-CM | POA: Insufficient documentation

## 2022-07-28 LAB — COMPREHENSIVE METABOLIC PANEL
ALT: 23 U/L (ref 0–44)
AST: 35 U/L (ref 15–41)
Albumin: 3.5 g/dL (ref 3.5–5.0)
Alkaline Phosphatase: 75 U/L (ref 38–126)
Anion gap: 7 (ref 5–15)
BUN: 14 mg/dL (ref 6–20)
CO2: 28 mmol/L (ref 22–32)
Calcium: 8.7 mg/dL — ABNORMAL LOW (ref 8.9–10.3)
Chloride: 106 mmol/L (ref 98–111)
Creatinine, Ser: 0.76 mg/dL (ref 0.44–1.00)
GFR, Estimated: >60 mL/min (ref >60)
Glucose, Bld: 107 mg/dL — ABNORMAL HIGH (ref 70–99)
Potassium: 3.9 mmol/L (ref 3.5–5.1)
Sodium: 141 mmol/L (ref 135–145)
Total Bilirubin: 0.7 mg/dL (ref 0.3–1.2)
Total Protein: 6.5 g/dL (ref 6.5–8.1)

## 2022-07-28 LAB — CBG MONITORING, ED: Glucose-Capillary: 132 mg/dL — ABNORMAL HIGH (ref 70–99)

## 2022-07-28 LAB — CBC WITH DIFFERENTIAL/PLATELET
Abs Immature Granulocytes: 0.01 10*3/uL (ref 0.00–0.07)
Basophils Absolute: 0 10*3/uL (ref 0.0–0.1)
Basophils Relative: 1 %
Eosinophils Absolute: 0.1 10*3/uL (ref 0.0–0.5)
Eosinophils Relative: 1 %
HCT: 38.2 % (ref 36.0–46.0)
Hemoglobin: 12.7 g/dL (ref 12.0–15.0)
Immature Granulocytes: 0 %
Lymphocytes Relative: 44 %
Lymphs Abs: 2.4 10*3/uL (ref 0.7–4.0)
MCH: 32.3 pg (ref 26.0–34.0)
MCHC: 33.2 g/dL (ref 30.0–36.0)
MCV: 97.2 fL (ref 80.0–100.0)
Monocytes Absolute: 0.5 10*3/uL (ref 0.1–1.0)
Monocytes Relative: 10 %
Neutro Abs: 2.4 10*3/uL (ref 1.7–7.7)
Neutrophils Relative %: 44 %
Platelets: 255 10*3/uL (ref 150–400)
RBC: 3.93 MIL/uL (ref 3.87–5.11)
RDW: 13.7 % (ref 11.5–15.5)
WBC: 5.4 10*3/uL (ref 4.0–10.5)
nRBC: 0 % (ref 0.0–0.2)

## 2022-07-28 LAB — I-STAT BETA HCG BLOOD, ED (MC, WL, AP ONLY): I-stat hCG, quantitative: <5 m[IU]/mL (ref <5)

## 2022-07-28 MED ORDER — IBUPROFEN 400 MG PO TABS
600.0000 mg | ORAL_TABLET | Freq: Once | ORAL | Status: AC
  Administered 2022-07-28: 600 mg via ORAL
  Filled 2022-07-28: qty 1

## 2022-07-28 MED ORDER — ACETAMINOPHEN 500 MG PO TABS
1000.0000 mg | ORAL_TABLET | Freq: Once | ORAL | Status: AC
  Administered 2022-07-28: 1000 mg via ORAL
  Filled 2022-07-28: qty 2

## 2022-07-28 MED ORDER — HYDROCORTISONE (PERIANAL) 2.5 % EX CREA: 1.0000 | TOPICAL_CREAM | Freq: Two times a day (BID) | CUTANEOUS | 0 refills | Status: DC
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
## 2022-07-28 NOTE — Discharge Instructions (Signed)
You were seen in the ER today for evaluation of your rectal pain.   Contact a doctor if you: Have pain and swelling that do not get better with treatment or medicine. Have trouble pooping. Cannot poop. Have pain or swelling outside the area of the hemorrhoids. Get help right away if you have: Bleeding that will not stop.

## 2022-07-28 NOTE — ED Triage Notes (Signed)
Pt arrived POV from home c/o having hemorrhoids but now she is positive she has a big abscess on her rectum as well. Pt states she noticed it 3 days ago and now it is very painful.

## 2022-07-30 ENCOUNTER — Telehealth: Payer: Self-pay | Admitting: Obstetrics and Gynecology

## 2022-07-30 NOTE — Patient Outreach (Signed)
.  Transition Care Management Unsuccessful Follow-up Telephone Call  Date of discharge and from where:  07/28/22 from [facility] ED  Attempts:  1st Attempt  Reason for unsuccessful TCM follow-up call:  Missing or invalid number, not in service.

## 2022-08-06 DIAGNOSIS — K644 Residual hemorrhoidal skin tags: Secondary | ICD-10-CM | POA: Diagnosis not present

## 2022-08-18 DIAGNOSIS — Z419 Encounter for procedure for purposes other than remedying health state, unspecified: Secondary | ICD-10-CM | POA: Diagnosis not present

## 2022-09-10 ENCOUNTER — Other Ambulatory Visit: Payer: Self-pay

## 2022-09-10 ENCOUNTER — Encounter (HOSPITAL_COMMUNITY): Payer: Self-pay | Admitting: General Surgery

## 2022-09-10 ENCOUNTER — Ambulatory Visit: Payer: Self-pay | Admitting: General Surgery

## 2022-09-10 NOTE — Pre-Procedure Instructions (Signed)
PCP - no PCP Cardiologist - pt denies  PPM/ICD - pt denies Device Orders - n/a Rep Notified - n/a  EKG - 12/07/21 Stress Test - pt denies ECHO - pt denies Cardiac Cath - pt denies  Sleep Study/CPAP - pt denies  Diabetic- pt denies Fasting Blood Sugar -  Checks Blood Sugar _____ times a day  Last dose of GLP1 agonist-  pt denies GLP1 instructions: n/a  Blood Thinner Instructions:pt denies Aspirin Instructions:pt denies  ERAS Protcol - YES  COVID TEST- n/a   Anesthesia review: YES, heart murmur, pt denies echo. Cocaine use 2 days ago.    Patient verbally denies any shortness of breath, fever, cough and chest pain during phone call.     -------------  SDW INSTRUCTIONS given:   Your procedure is scheduled on 09/12/22.             Report to [facility] Main Entrance "A" at  5:30  A.M., and check in at the Admitting office.             Call this number if you have problems the morning of surgery:             [phone]               Remember:             Do not eat  after midnight the night before your surgery; Clear Liquids until 4:30 am.Juice (not-citric and without pulp - diabetics please choose diet or no sugar options); Water; Carbonated beverages - (diabetics please choose diet or no sugar options); Clear Tea; Black Coffee Only (no creamer, milk or cream including half and half); Gatorade (diabetics please choose diet or no sugar options)                           Take these medicines the morning of surgery with A SIP OF WATER : NONE. Pt denies taking any medications.   As of today, STOP taking any Aspirin (unless otherwise instructed by your surgeon) Aleve, Naproxen, Ibuprofen, Motrin, Advil, Goody's, BC's, all herbal medications, fish oil, and all vitamins.                      Do not wear jewelry, make up, or nail polish            Do not wear lotions, powders, perfumes/colognes, or deodorant.            Do not shave 48 hours prior to surgery.  Men may shave face  and neck.            Do not bring valuables to the hospital.            [facility] is not responsible for any belongings or valuables.   Do NOT Smoke (Tobacco/Vaping) 24 hours prior to your procedure If you use a CPAP at night, you may bring all equipment for your overnight stay.   Contacts, glasses, dentures or partials may not be worn into surgery.      For patients admitted to the hospital, discharge time will be determined by your treatment team.   Patients discharged the day of surgery will not be allowed to drive home, and someone needs to stay with them for 24 hours.     Special instructions:   [facility]- Preparing For Surgery  Oral Hygiene is also important to reduce your risk of infection.  Remember - BRUSH YOUR TEETH THE MORNING OF SURGERY WITH YOUR REGULAR TOOTHPASTE   Before surgery, you can play an important role. Because skin is not sterile, your skin needs to be as free of germs as possible. You can reduce the number of germs on your skin by washing with Dial (or any antibacterial) Soap before surgery.    Please do not use if you have an allergy to antibacterial soaps. If your skin becomes reddened/irritated stop using the Antibacterial Soap  Do not shave (including legs and underarms) for at least 48 hours prior to surgery. It is OK to shave your face.   Please follow these instructions carefully.              Shower the NIGHT BEFORE SURGERY and the MORNING OF SURGERY with (DIAL) Antibacterial Soap. Wash your body and hair with your normal shampoo/soap then rinse. Using a clean wash cloth wash from your neck down using the antibacterial soap, do not wash private area with the clean wash cloth. Wash thoroughly, paying special attention to the area where your surgery will be performed. Thoroughly rinse your body with warm water from the neck down.   Pat yourself dry with a CLEAN TOWEL.   Wear CLEAN PAJAMAS to bed the night before surgery.   Place CLEAN SHEETS on your  bed the night of your surgery and DO NOT SLEEP WITH PETS.     Day of Surgery: Please shower morning of surgery using antibacterial soap Wear Clean/Comfortable clothing the morning of surgery Do not apply any deodorants/lotions.   Remember to brush your teeth WITH YOUR REGULAR TOOTHPASTE.   Questions were answered. Patient verbalized understanding of instructions.

## 2022-09-11 ENCOUNTER — Encounter (HOSPITAL_COMMUNITY): Payer: Self-pay | Admitting: Vascular Surgery

## 2022-09-11 ENCOUNTER — Encounter (HOSPITAL_COMMUNITY): Payer: Self-pay | Admitting: General Surgery
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
## 2022-09-12 ENCOUNTER — Ambulatory Visit (HOSPITAL_COMMUNITY): Admission: RE | Admit: 2022-09-12 | Payer: Medicaid Other | Source: Home / Self Care | Admitting: General Surgery

## 2022-09-12 HISTORY — DX: Other psychoactive substance use, unspecified, uncomplicated: F19.90

## 2022-09-12 HISTORY — DX: Cardiac murmur, unspecified: R01.1

## 2022-09-12 HISTORY — DX: Essential (primary) hypertension: I10

## 2022-09-12 SURGERY — HEMORRHOIDECTOMY
Anesthesia: General

## 2022-09-18 DIAGNOSIS — Z419 Encounter for procedure for purposes other than remedying health state, unspecified: Secondary | ICD-10-CM | POA: Diagnosis not present

## 2022-10-17 DIAGNOSIS — Z419 Encounter for procedure for purposes other than remedying health state, unspecified: Secondary | ICD-10-CM | POA: Diagnosis not present

## 2022-10-21 ENCOUNTER — Other Ambulatory Visit: Payer: Self-pay

## 2022-10-21 ENCOUNTER — Encounter (HOSPITAL_COMMUNITY): Payer: Self-pay | Admitting: Emergency Medicine

## 2022-10-21 ENCOUNTER — Emergency Department (HOSPITAL_COMMUNITY)
Admission: EM | Admit: 2022-10-21 | Discharge: 2022-10-21 | Disposition: A | Discharge status: Home / Self Care | Payer: Medicaid Other | Attending: Emergency Medicine | Admitting: Emergency Medicine

## 2022-10-21 DIAGNOSIS — Z20822 Contact with and (suspected) exposure to covid-19: Secondary | ICD-10-CM | POA: Insufficient documentation

## 2022-10-21 DIAGNOSIS — R1011 Right upper quadrant pain: Secondary | ICD-10-CM | POA: Diagnosis not present

## 2022-10-21 DIAGNOSIS — R197 Diarrhea, unspecified: Secondary | ICD-10-CM | POA: Diagnosis not present

## 2022-10-21 DIAGNOSIS — R1013 Epigastric pain: Secondary | ICD-10-CM | POA: Insufficient documentation

## 2022-10-21 DIAGNOSIS — R112 Nausea with vomiting, unspecified: Secondary | ICD-10-CM | POA: Diagnosis not present

## 2022-10-21 DIAGNOSIS — H538 Other visual disturbances: Secondary | ICD-10-CM | POA: Diagnosis not present

## 2022-10-21 DIAGNOSIS — R1012 Left upper quadrant pain: Secondary | ICD-10-CM | POA: Diagnosis not present

## 2022-10-21 DIAGNOSIS — I10 Essential (primary) hypertension: Secondary | ICD-10-CM | POA: Insufficient documentation

## 2022-10-21 LAB — COMPREHENSIVE METABOLIC PANEL
ALT: 25 U/L (ref 0–44)
AST: 35 U/L (ref 15–41)
Albumin: 3.9 g/dL (ref 3.5–5.0)
Alkaline Phosphatase: 80 U/L (ref 38–126)
Anion gap: 10 (ref 5–15)
BUN: 12 mg/dL (ref 6–20)
CO2: 23 mmol/L (ref 22–32)
Calcium: 8.9 mg/dL (ref 8.9–10.3)
Chloride: 104 mmol/L (ref 98–111)
Creatinine, Ser: 0.77 mg/dL (ref 0.44–1.00)
GFR, Estimated: >60 mL/min (ref >60)
Glucose, Bld: 81 mg/dL (ref 70–99)
Potassium: 4.3 mmol/L (ref 3.5–5.1)
Sodium: 137 mmol/L (ref 135–145)
Total Bilirubin: 1.2 mg/dL (ref 0.3–1.2)
Total Protein: 7 g/dL (ref 6.5–8.1)

## 2022-10-21 LAB — URINALYSIS, ROUTINE W REFLEX MICROSCOPIC
Bilirubin Urine: NEGATIVE
Glucose, UA: NEGATIVE mg/dL
Ketones, ur: NEGATIVE mg/dL
Leukocytes,Ua: NEGATIVE
Nitrite: NEGATIVE
Protein, ur: NEGATIVE mg/dL
Specific Gravity, Urine: 1.012 (ref 1.005–1.030)
pH: 7 (ref 5.0–8.0)

## 2022-10-21 LAB — CBC WITH DIFFERENTIAL/PLATELET
Abs Immature Granulocytes: 0.01 10*3/uL (ref 0.00–0.07)
Basophils Absolute: 0.1 10*3/uL (ref 0.0–0.1)
Basophils Relative: 1 %
Eosinophils Absolute: 0 10*3/uL (ref 0.0–0.5)
Eosinophils Relative: 0 %
HCT: 36.3 % (ref 36.0–46.0)
Hemoglobin: 12.1 g/dL (ref 12.0–15.0)
Immature Granulocytes: 0 %
Lymphocytes Relative: 27 %
Lymphs Abs: 1.6 10*3/uL (ref 0.7–4.0)
MCH: 30 pg (ref 26.0–34.0)
MCHC: 33.3 g/dL (ref 30.0–36.0)
MCV: 89.9 fL (ref 80.0–100.0)
Monocytes Absolute: 0.3 10*3/uL (ref 0.1–1.0)
Monocytes Relative: 6 %
Neutro Abs: 3.9 10*3/uL (ref 1.7–7.7)
Neutrophils Relative %: 66 %
Platelets: 357 10*3/uL (ref 150–400)
RBC: 4.04 MIL/uL (ref 3.87–5.11)
RDW: 15.9 % — ABNORMAL HIGH (ref 11.5–15.5)
WBC: 5.9 10*3/uL (ref 4.0–10.5)
nRBC: 0 % (ref 0.0–0.2)

## 2022-10-21 LAB — I-STAT BETA HCG BLOOD, ED (MC, WL, AP ONLY): I-stat hCG, quantitative: <5 m[IU]/mL (ref <5)

## 2022-10-21 LAB — RAPID URINE DRUG SCREEN, HOSP PERFORMED
Amphetamines: NOT DETECTED
Barbiturates: NOT DETECTED
Benzodiazepines: NOT DETECTED
Cocaine: POSITIVE — AB
Opiates: NOT DETECTED
Tetrahydrocannabinol: POSITIVE — AB

## 2022-10-21 LAB — RESP PANEL BY RT-PCR (RSV, FLU A&B, COVID)  RVPGX2
Influenza A by PCR: NEGATIVE
Influenza B by PCR: NEGATIVE
Resp Syncytial Virus by PCR: NEGATIVE
SARS Coronavirus 2 by RT PCR: NEGATIVE

## 2022-10-21 LAB — LIPASE, BLOOD: Lipase: 31 U/L (ref 11–51)

## 2022-10-21 MED ORDER — ONDANSETRON 4 MG PO TBDP: 4.0000 mg | ORAL_TABLET | Freq: Three times a day (TID) | ORAL | 0 refills | Status: DC | PRN

## 2022-10-21 MED ORDER — ONDANSETRON HCL 4 MG/2ML IJ SOLN
4.0000 mg | Freq: Once | INTRAMUSCULAR | Status: AC
  Administered 2022-10-21: 4 mg via INTRAVENOUS
  Filled 2022-10-21: qty 2

## 2022-10-21 MED ORDER — SODIUM CHLORIDE 0.9 % IV BOLUS
1000.0000 mL | Freq: Once | INTRAVENOUS | Status: AC
  Administered 2022-10-21: 1000 mL via INTRAVENOUS

## 2022-10-21 MED ORDER — LORAZEPAM 2 MG/ML IJ SOLN
1.0000 mg | Freq: Once | INTRAMUSCULAR | Status: AC
  Administered 2022-10-21: 1 mg via INTRAVENOUS
  Filled 2022-10-21: qty 1

## 2022-10-21 NOTE — ED Notes (Signed)
Pt states she does not have to urinate at this time to provide sample. Unwilling to try

## 2022-10-21 NOTE — ED Notes (Signed)
Pt states she does not have to urinate at this time to provide sample.

## 2022-10-21 NOTE — ED Provider Notes (Signed)
[facility] Provider Note   CSN: [id number] Arrival date & time: 10/21/22  [id number]     History  Chief Complaint  Patient presents with   Nausea   Abdominal Pain    Denise Brandt is a 42 y.o. female with a past medical history of anxiety, bipolar 1 disorder, hypertension, polysubstance use disorder presenting today for evaluation of nausea, diarrhea, abdominal pain.  Patient states she drank water from the faucet around 8 AM today she started to have epigastric abdominal pain with nbnb nausea, vomiting x1 and diarrhea.  She states she still has some metal taste in her throat.  She described the pain as spasm, located in the epigastrium, rating to her back, intermittent. She also states she is experiencing blurred vision intermittently since this happened, states both eyes are blurred but more prominent in the L eye.   Endorses marijuana use, last time was yesterday.  She denies any fever, chest pain, shortness of breath, urinary symptoms, blood in her stool or urine.   Abdominal Pain   Past Medical History:  Diagnosis Date   Anemia    Anxiety    Bipolar 1 disorder ([place])    Depression    Fibroid    Heart murmur    was told this at age 31. has not been told this by PCP; patient has never seen cardiologist   Hypertension    Ovarian cyst    Polysubstance use disorder    Past Surgical History:  Procedure Laterality Date   CESAREAN SECTION     CESAREAN SECTION  11/29/2021   Procedure: CESAREAN SECTION;  Surgeon: Griffin Basil, MD;  Location: [place] LD ORS;  Service: Obstetrics;;   I & D EXTREMITY Right 02/25/2018   Procedure: IRRIGATION AND DEBRIDEMENT EXTREMITY;  Surgeon: [name], MD;  Location: [place];  Service: Orthopedics;  Laterality: Right;     Home Medications Prior to Admission medications   Medication Sig Start Date End Date Taking? Authorizing Provider  ondansetron (ZOFRAN-ODT) 4 MG disintegrating tablet Take 1  tablet (4 mg total) by mouth every 8 (eight) hours as needed for nausea or vomiting. 10/21/22  Yes Rex Kras, PA  acetaminophen (TYLENOL) 325 MG suppository Place 1 suppository (325 mg total) rectally every 4 (four) hours as needed. Patient not taking: Reported on 09/11/2022 12/02/21   Clarnce Flock, MD  hydrocortisone (ANUSOL-HC) 2.5 % rectal cream Place 1 Application rectally 2 (two) times daily. Patient not taking: Reported on 09/11/2022 07/28/22   Sherrell Puller, PA-C  NIFEdipine (ADALAT CC) 30 MG 24 hr tablet Take 1 tablet (30 mg total) by mouth daily. Patient not taking: Reported on 09/11/2022 12/02/21   Clarnce Flock, MD  polyethylene glycol powder [facility]) 17 GM/SCOOP powder Take 17 g by mouth daily as needed. Patient not taking: Reported on 09/11/2022 12/02/21   Clarnce Flock, MD  Prenatal Vit-Fe Fumarate-FA (PRENATAL PLUS VITAMIN/MINERAL) 27-1 MG TABS Take 1 tablet by mouth daily. Patient not taking: Reported on 09/11/2022 09/16/21   Elvera Maria, CNM      Allergies    Patient has no known allergies.    Review of Systems   Review of Systems  Gastrointestinal:  Positive for abdominal pain.    Physical Exam Updated Vital Signs BP 134/89   Pulse 66   Temp 98.4 F (36.9 C) (Oral)   Resp 15   Ht '5\' 3"'$  (1.6 m)   Wt 72.6 kg   LMP 08/20/2022  SpO2 99%   BMI 28.34 kg/m  Physical Exam Vitals and nursing note reviewed.  Constitutional:      Appearance: Normal appearance.  HENT:     Head: Normocephalic and atraumatic.     Mouth/Throat:     Mouth: Mucous membranes are moist.  Eyes:     General: No scleral icterus. Cardiovascular:     Rate and Rhythm: Normal rate and regular rhythm.     Pulses: Normal pulses.     Heart sounds: Normal heart sounds.  Pulmonary:     Effort: Pulmonary effort is normal.     Breath sounds: Normal breath sounds.  Abdominal:     General: Abdomen is flat.     Palpations: Abdomen is soft.     Tenderness: There is  abdominal tenderness in the right upper quadrant, epigastric area and left upper quadrant.  Musculoskeletal:        General: No deformity.  Skin:    General: Skin is warm.     Findings: No rash.  Neurological:     General: No focal deficit present.     Mental Status: She is alert.     Comments: Cranial nerves II through XII intact. Intact sensation to light touch in all 4 extremities. 5/5 strength in all 4 extremities. Intact finger-to-nose and heel-to-shin of all 4 extremities. No visual field cuts. No neglect noted. No aphasia noted.   Psychiatric:        Mood and Affect: Mood normal.     ED Results / Procedures / Treatments   Labs (all labs ordered are listed, but only abnormal results are displayed) Labs Reviewed  CBC WITH DIFFERENTIAL/PLATELET - Abnormal; Notable for the following components:      Result Value   RDW 15.9 (*)    All other components within normal limits  URINALYSIS, ROUTINE W REFLEX MICROSCOPIC - Abnormal; Notable for the following components:   Hgb urine dipstick LARGE (*)    Bacteria, UA RARE (*)    All other components within normal limits  RAPID URINE DRUG SCREEN, HOSP PERFORMED - Abnormal; Notable for the following components:   Cocaine POSITIVE (*)    Tetrahydrocannabinol POSITIVE (*)    All other components within normal limits  RESP PANEL BY RT-PCR (RSV, FLU A&B, COVID)  RVPGX2  COMPREHENSIVE METABOLIC PANEL  LIPASE, BLOOD  I-STAT BETA HCG BLOOD, ED ([facility], [facility], [facility] ONLY)    EKG None  Radiology No results found.  Procedures Procedures    Medications Ordered in ED Medications  LORazepam (ATIVAN) injection 1 mg (1 mg Intravenous Given 10/21/22 0951)  ondansetron (ZOFRAN) injection 4 mg (4 mg Intravenous Given 10/21/22 0951)  sodium chloride 0.9 % bolus 1,000 mL (0 mLs Intravenous Stopped 10/21/22 1217)    ED Course/ Medical Decision Making/ A&P                             Medical Decision Making Amount and/or Complexity of Data  Reviewed Labs: ordered.  Risk Prescription drug management.   This patient presents to the ED for abdominal pain, nausea, vomiting, diarrhea, this involves an extensive number of treatment options, and is a complaint that carries with a high risk of complications and morbidity.  The differential diagnosis includes .lemdm.  This is not an exhaustive list.  Lab tests: I ordered and personally interpreted labs.  The pertinent results include: WBC unremarkable. Hbg unremarkable. Platelets unremarkable. Electrolytes unremarkable. BUN, creatinine unremarkable.  Viral panel  negative.  Urine drug screen positive for cocaine and tetrahydrocannabinol.  Imaging studies:  Problem list/ ED course/ Critical interventions/ Medical management: HPI: See above Vital signs within normal range and stable throughout visit. Laboratory/imaging studies significant for: See above. On physical examination, patient is afebrile and appears in no acute distress. This patient with nausea and vomiting which is likely secondary to benign infectious cause possibly a viral illness versus cannabinoid induced vomiting. Considered but low risk for SBO (normal BM, passing flatus, no abdominal surgeries), no signs of DKA in labs. Patient BMP with normal electrolytes and no sign of dehydration causing prerenal AKI. Low suspicion for gastric or esophageal dysmotility as cause. Patient with no chest pain, unremarkable EKG so low suspicion for ACS. Based on history, exam, and work up low suspicion for pancreatitis, appendicitis, biliary pathology, or other emergent problem. Patient given zofran and tolerated PO here. Patient to be discharged with zofran and to follow up with PCP.  I discussed case with my attending who agreed to the plan. I have reviewed the patient home medicines and have made adjustments as needed.  Cardiac monitoring/EKG: The patient was maintained on a cardiac monitor.  I personally reviewed and interpreted the  cardiac monitor which showed an underlying rhythm of: sinus rhythm.  Additional history obtained: External records from outside source obtained and reviewed including: Chart review including previous notes, labs, imaging.  Consultations obtained:  Disposition Continued outpatient therapy. Follow-up with PCP recommended for reevaluation of symptoms. Treatment plan discussed with patient.  Pt acknowledged understanding was agreeable to the plan. Worrisome signs and symptoms were discussed with patient, and patient acknowledged understanding to return to the ED if they noticed these signs and symptoms. Patient was stable upon discharge.   This chart was dictated using voice recognition software.  Despite best efforts to proofread,  errors can occur which can change the documentation meaning.          Final Clinical Impression(s) / ED Diagnoses Final diagnoses:  Nausea and vomiting, unspecified vomiting type  Diarrhea, unspecified type    Rx / DC Orders ED Discharge Orders          Ordered    ondansetron (ZOFRAN-ODT) 4 MG disintegrating tablet  Every 8 hours PRN        10/21/22 1309              Rex Kras, [place] 10/21/22 1314    Valarie Merino, MD 10/21/22 [phone]

## 2022-10-21 NOTE — Discharge Instructions (Addendum)
Please take your medications as prescribed. Take tylenol/ibuprofen for pain. I recommend close follow-up with PCP for reevaluation.  Please do not hesitate to return to emergency department if worrisome signs symptoms we discussed become apparent.

## 2022-10-21 NOTE — ED Triage Notes (Addendum)
Patient states she drank some water from her faucet at her house an hour ago and since then she's been having abdominal pain, "stomach is knotting up really bad", n/v/d. Patient states that the water tasted bad and tasted like lead. Patient also stated she's experiencing blurred vision intermittently since this happened, states both eyes are blurred but more prominent in the L eye.

## 2022-11-17 DIAGNOSIS — Z419 Encounter for procedure for purposes other than remedying health state, unspecified: Secondary | ICD-10-CM | POA: Diagnosis not present

## 2022-12-17 DIAGNOSIS — Z419 Encounter for procedure for purposes other than remedying health state, unspecified: Secondary | ICD-10-CM | POA: Diagnosis not present

## 2023-01-17 DIAGNOSIS — Z419 Encounter for procedure for purposes other than remedying health state, unspecified: Secondary | ICD-10-CM | POA: Diagnosis not present

## 2023-02-16 DIAGNOSIS — Z419 Encounter for procedure for purposes other than remedying health state, unspecified: Secondary | ICD-10-CM | POA: Diagnosis not present

## 2023-03-19 DIAGNOSIS — Z419 Encounter for procedure for purposes other than remedying health state, unspecified: Secondary | ICD-10-CM | POA: Diagnosis not present

## 2023-04-19 DIAGNOSIS — Z419 Encounter for procedure for purposes other than remedying health state, unspecified: Secondary | ICD-10-CM | POA: Diagnosis not present

## 2023-05-19 DIAGNOSIS — Z419 Encounter for procedure for purposes other than remedying health state, unspecified: Secondary | ICD-10-CM | POA: Diagnosis not present

## 2023-06-19 DIAGNOSIS — Z419 Encounter for procedure for purposes other than remedying health state, unspecified: Secondary | ICD-10-CM | POA: Diagnosis not present

## 2023-07-19 DIAGNOSIS — Z419 Encounter for procedure for purposes other than remedying health state, unspecified: Secondary | ICD-10-CM | POA: Diagnosis not present

## 2023-08-19 DIAGNOSIS — Z419 Encounter for procedure for purposes other than remedying health state, unspecified: Secondary | ICD-10-CM | POA: Diagnosis not present

## 2023-08-28 ENCOUNTER — Encounter (HOSPITAL_COMMUNITY): Payer: Self-pay | Admitting: *Deleted

## 2023-08-28 ENCOUNTER — Emergency Department (HOSPITAL_COMMUNITY): Payer: 59

## 2023-08-28 ENCOUNTER — Emergency Department (HOSPITAL_COMMUNITY)
Admission: EM | Admit: 2023-08-28 | Discharge: 2023-08-28 | Disposition: A | Discharge status: Home / Self Care | Payer: 59 | Attending: Emergency Medicine | Admitting: Emergency Medicine

## 2023-08-28 ENCOUNTER — Other Ambulatory Visit: Payer: Self-pay

## 2023-08-28 DIAGNOSIS — J020 Streptococcal pharyngitis: Secondary | ICD-10-CM | POA: Insufficient documentation

## 2023-08-28 DIAGNOSIS — Z20822 Contact with and (suspected) exposure to covid-19: Secondary | ICD-10-CM | POA: Insufficient documentation

## 2023-08-28 DIAGNOSIS — R1032 Left lower quadrant pain: Secondary | ICD-10-CM | POA: Diagnosis not present

## 2023-08-28 DIAGNOSIS — M791 Myalgia, unspecified site: Secondary | ICD-10-CM | POA: Diagnosis not present

## 2023-08-28 LAB — CBC
HCT: 40.7 % (ref 36.0–46.0)
Hemoglobin: 13.3 g/dL (ref 12.0–15.0)
MCH: 30.9 pg (ref 26.0–34.0)
MCHC: 32.7 g/dL (ref 30.0–36.0)
MCV: 94.7 fL (ref 80.0–100.0)
Platelets: 260 10*3/uL (ref 150–400)
RBC: 4.3 MIL/uL (ref 3.87–5.11)
RDW: 14.1 % (ref 11.5–15.5)
WBC: 14.5 10*3/uL — ABNORMAL HIGH (ref 4.0–10.5)
nRBC: 0 % (ref 0.0–0.2)

## 2023-08-28 LAB — URINALYSIS, ROUTINE W REFLEX MICROSCOPIC
Bilirubin Urine: NEGATIVE
Glucose, UA: NEGATIVE mg/dL
Hgb urine dipstick: NEGATIVE
Ketones, ur: NEGATIVE mg/dL
Leukocytes,Ua: NEGATIVE
Nitrite: NEGATIVE
Protein, ur: NEGATIVE mg/dL
Specific Gravity, Urine: 1.015 (ref 1.005–1.030)
pH: 6 (ref 5.0–8.0)

## 2023-08-28 LAB — RESP PANEL BY RT-PCR (RSV, FLU A&B, COVID)  RVPGX2
Influenza A by PCR: NEGATIVE
Influenza B by PCR: NEGATIVE
Resp Syncytial Virus by PCR: NEGATIVE
SARS Coronavirus 2 by RT PCR: NEGATIVE

## 2023-08-28 LAB — COMPREHENSIVE METABOLIC PANEL
ALT: 24 U/L (ref 0–44)
AST: 24 U/L (ref 15–41)
Albumin: 3.4 g/dL — ABNORMAL LOW (ref 3.5–5.0)
Alkaline Phosphatase: 69 U/L (ref 38–126)
Anion gap: 9 (ref 5–15)
BUN: 6 mg/dL (ref 6–20)
CO2: 24 mmol/L (ref 22–32)
Calcium: 8.7 mg/dL — ABNORMAL LOW (ref 8.9–10.3)
Chloride: 105 mmol/L (ref 98–111)
Creatinine, Ser: 0.95 mg/dL (ref 0.44–1.00)
GFR, Estimated: >60 mL/min (ref >60)
Glucose, Bld: 100 mg/dL — ABNORMAL HIGH (ref 70–99)
Potassium: 4.4 mmol/L (ref 3.5–5.1)
Sodium: 138 mmol/L (ref 135–145)
Total Bilirubin: 1 mg/dL (ref 0.0–1.2)
Total Protein: 6.4 g/dL — ABNORMAL LOW (ref 6.5–8.1)

## 2023-08-28 LAB — HCG, SERUM, QUALITATIVE: Preg, Serum: NEGATIVE

## 2023-08-28 LAB — LIPASE, BLOOD: Lipase: 37 U/L (ref 11–51)

## 2023-08-28 LAB — GROUP A STREP BY PCR: Group A Strep by PCR: DETECTED — AB

## 2023-08-28 MED ORDER — FENTANYL CITRATE PF 50 MCG/ML IJ SOSY
25.0000 ug | PREFILLED_SYRINGE | Freq: Once | INTRAMUSCULAR | Status: AC
Start: 2023-08-28 — End: 2023-08-28
  Administered 2023-08-28: 25 ug via INTRAVENOUS
  Filled 2023-08-28: qty 1

## 2023-08-28 MED ORDER — FENTANYL CITRATE PF 50 MCG/ML IJ SOSY
50.0000 ug | PREFILLED_SYRINGE | Freq: Once | INTRAMUSCULAR | Status: AC
Start: 2023-08-28 — End: 2023-08-28
  Administered 2023-08-28: 50 ug via INTRAVENOUS
  Filled 2023-08-28: qty 1

## 2023-08-28 MED ORDER — ACETAMINOPHEN 500 MG PO TABS
1000.0000 mg | ORAL_TABLET | Freq: Once | ORAL | Status: AC
  Administered 2023-08-28: 1000 mg via ORAL
  Filled 2023-08-28: qty 2

## 2023-08-28 MED ORDER — AMOXICILLIN 500 MG PO CAPS
500.0000 mg | ORAL_CAPSULE | Freq: Once | ORAL | Status: AC
  Administered 2023-08-28: 500 mg via ORAL
  Filled 2023-08-28: qty 1

## 2023-08-28 MED ORDER — DEXAMETHASONE 4 MG PO TABS
10.0000 mg | ORAL_TABLET | Freq: Once | ORAL | Status: AC
  Administered 2023-08-28: 10 mg via ORAL
  Filled 2023-08-28: qty 3

## 2023-08-28 MED ORDER — AMOXICILLIN 500 MG PO CAPS: 500.0000 mg | ORAL_CAPSULE | Freq: Two times a day (BID) | ORAL | 0 refills | Status: AC

## 2023-08-28 MED ORDER — IOHEXOL 350 MG/ML SOLN
75.0000 mL | Freq: Once | INTRAVENOUS | Status: AC | PRN
  Administered 2023-08-28: 75 mL via INTRAVENOUS

## 2023-08-28 NOTE — ED Provider Notes (Signed)
 [place] [facility] DEPARTMENT AT [facility] Provider Note   CSN: [id number] Arrival date & time: 08/28/23  1558     History Chief Complaint  Patient presents with   Generalized Body Aches   Abdominal Pain    Denise Brandt is a 43 y.o. female.  Patient presents to the emergency department concerns of generalized abdominal pain, and bodyaches.  States that she has had abdominal pain for the last 3 to 4 days with subjective fevers at home.  No nausea vomiting or diarrhea.  No sick contacts as far as patient is aware.  Does endorse some alcohol use recently as well as synthetic marijuana use but denies any IV drug use.  Past history significant for PTSD, alcohol use disorder, cocaine use disorder, anemia, and prior C-section.   Abdominal Pain      Home Medications Prior to Admission medications   Medication Sig Start Date End Date Taking? Authorizing Provider  amoxicillin  (AMOXIL ) 500 MG capsule Take 1 capsule (500 mg total) by mouth 2 (two) times daily for 10 days. 08/28/23 09/07/23 Yes Tahira Olivarez A, PA-C  acetaminophen  (TYLENOL ) 325 MG suppository Place 1 suppository (325 mg total) rectally every 4 (four) hours as needed. Patient not taking: Reported on 09/11/2022 12/02/21   Lola Donnice HERO, MD  hydrocortisone  (ANUSOL -HC) 2.5 % rectal cream Place 1 Application rectally 2 (two) times daily. Patient not taking: Reported on 09/11/2022 07/28/22   Bernis Ernst, PA-C  NIFEdipine  (ADALAT  CC) 30 MG 24 hr tablet Take 1 tablet (30 mg total) by mouth daily. Patient not taking: Reported on 09/11/2022 12/02/21   Lola Donnice HERO, MD  ondansetron  (ZOFRAN -ODT) 4 MG disintegrating tablet Take 1 tablet (4 mg total) by mouth every 8 (eight) hours as needed for nausea or vomiting. 10/21/22   Ladora Congress, PA  polyethylene glycol powder (GLYCOLAX /MIRALAX ) 17 GM/SCOOP powder Take 17 g by mouth daily as needed. Patient not taking: Reported on 09/11/2022 12/02/21   Lola Donnice HERO, MD   Prenatal Vit-Fe Fumarate-FA (PRENATAL PLUS VITAMIN/MINERAL) 27-1 MG TABS Take 1 tablet by mouth daily. Patient not taking: Reported on 09/11/2022 09/16/21   Milly Olam LABOR, CNM      Allergies    Patient has no known allergies.    Review of Systems   Review of Systems  Gastrointestinal:  Positive for abdominal pain.  All other systems reviewed and are negative.   Physical Exam Updated Vital Signs BP 119/79 (BP Location: Right Arm)   Pulse (!) 59   Temp 100.1 F (37.8 C) (Oral)   Resp (!) 22   Ht 5' 3 (1.6 m)   Wt 72.6 kg   LMP 06/27/2023   SpO2 96%   BMI 28.35 kg/m  Physical Exam Vitals and nursing note reviewed.  Constitutional:      General: She is not in acute distress.    Appearance: She is well-developed.  HENT:     Head: Normocephalic and atraumatic.     Comments: Oropharyngeal erythema    Mouth/Throat:     Pharynx: No pharyngeal swelling or oropharyngeal exudate.  Eyes:     Conjunctiva/sclera: Conjunctivae normal.  Cardiovascular:     Rate and Rhythm: Normal rate and regular rhythm.     Heart sounds: No murmur heard. Pulmonary:     Effort: Pulmonary effort is normal. No respiratory distress.     Breath sounds: Normal breath sounds.  Abdominal:     General: Abdomen is flat. Bowel sounds are normal. There is no distension.  Palpations: Abdomen is soft. There is no shifting dullness.     Tenderness: There is abdominal tenderness in the left lower quadrant. There is no guarding.  Musculoskeletal:        General: No swelling.     Cervical back: Neck supple.  Skin:    General: Skin is warm and dry.     Capillary Refill: Capillary refill takes less than 2 seconds.  Neurological:     Mental Status: She is alert.  Psychiatric:        Mood and Affect: Mood normal.     ED Results / Procedures / Treatments   Labs (all labs ordered are listed, but only abnormal results are displayed) Labs Reviewed  GROUP A STREP BY PCR - Abnormal; Notable for the  following components:      Result Value   Group A Strep by PCR DETECTED (*)    All other components within normal limits  COMPREHENSIVE METABOLIC PANEL - Abnormal; Notable for the following components:   Glucose, Bld 100 (*)    Calcium 8.7 (*)    Total Protein 6.4 (*)    Albumin 3.4 (*)    All other components within normal limits  CBC - Abnormal; Notable for the following components:   WBC 14.5 (*)    All other components within normal limits  RESP PANEL BY RT-PCR (RSV, FLU A&B, COVID)  RVPGX2  LIPASE, BLOOD  URINALYSIS, ROUTINE W REFLEX MICROSCOPIC  HCG, SERUM, QUALITATIVE    EKG None  Radiology CT ABDOMEN PELVIS W CONTRAST Result Date: 08/28/2023 CLINICAL DATA:  Left lower quadrant pain. EXAM: CT ABDOMEN AND PELVIS WITH CONTRAST TECHNIQUE: Multidetector CT imaging of the abdomen and pelvis was performed using the standard protocol following bolus administration of intravenous contrast. RADIATION DOSE REDUCTION: This exam was performed according to the departmental dose-optimization program which includes automated exposure control, adjustment of the mA and/or kV according to patient size and/or use of iterative reconstruction technique. CONTRAST:  75mL OMNIPAQUE  IOHEXOL  350 MG/ML SOLN COMPARISON:  12/07/2021 FINDINGS: Lower Chest: No acute findings. Hepatobiliary: No suspicious hepatic masses identified. Gallbladder is unremarkable. No evidence of biliary ductal dilatation. Pancreas:  No mass or inflammatory changes. Spleen: Within normal limits in size and appearance. Adrenals/Urinary Tract: No suspicious masses identified. No evidence of ureteral calculi or hydronephrosis. Stomach/Bowel: No evidence of obstruction, inflammatory process or abnormal fluid collections. Normal appendix visualized. Vascular/Lymphatic: No pathologically enlarged lymph nodes. No acute vascular findings. Reproductive:  No mass or other significant abnormality. Other:  None. Musculoskeletal:  No suspicious bone  lesions identified. IMPRESSION: Negative. No radiographic evidence of diverticulitis or other significant abnormality. Electronically Signed   By: Norleen DELENA Kil M.D.   On: 08/28/2023 18:56    Procedures Procedures   Medications Ordered in ED Medications  fentaNYL  (SUBLIMAZE ) injection 50 mcg (50 mcg Intravenous Given 08/28/23 1759)  iohexol  (OMNIPAQUE ) 350 MG/ML injection 75 mL (75 mLs Intravenous Contrast Given 08/28/23 1843)  fentaNYL  (SUBLIMAZE ) injection 25 mcg (25 mcg Intravenous Given 08/28/23 1928)  acetaminophen  (TYLENOL ) tablet 1,000 mg (1,000 mg Oral Given 08/28/23 1943)  dexamethasone  (DECADRON ) tablet 10 mg (10 mg Oral Given 08/28/23 2020)  amoxicillin  (AMOXIL ) capsule 500 mg (500 mg Oral Given 08/28/23 2019)    ED Course/ Medical Decision Making/ A&P                                 Medical Decision Making Amount and/or Complexity of  Data Reviewed Labs: ordered. Radiology: ordered.  Risk OTC drugs. Prescription drug management.   This patient presents to the ED for concern of abdominal pain.  Differential diagnosis includes bowel obstruction, constipation, gastroenteritis, urolithiasis, mesenteric ischemia   Lab Tests:  I Ordered, and personally interpreted labs.  The pertinent results include: CBC with leukocytosis of 14.5, UA unremarkable, CMP with mild hypoalbuminemia otherwise unremarkable, hCG negative, lipase unremarkable, group A strep positive, negative for COVID-19, influenza and RSV, UA without signs of infection   Imaging Studies ordered:  I ordered imaging studies including CT abdomen pelvis I independently visualized and interpreted imaging which showed no evidence of any acute abnormality I agree with the radiologist interpretation   Medicines ordered and prescription drug management:  I ordered medication including fentanyl , Decadron , amoxicillin  for pain, strep pharyngitis Reevaluation of the patient after these medicines showed that the patient  improved I have reviewed the patients home medicines and have made adjustments as needed   Problem List / ED Course:  Patient with past history significant for IV drug use, marijuana use, cocaine use disorder, alcohol use, depression and anxiety here with concerns of generalized abdominal pain as well as body aches.  Endorse that she has been experiencing subjective fever and chills but no episodes of vomiting, diarrhea, or severe nausea.  No sick contacts as far as patient is aware.  Nothing makes symptoms better or worse. On exam, patient does appear to be most notably tender to the left lower quadrant.  No areas of guarding or rebound tenderness.  Concern for possible pathology to indicate left lower quadrant tenderness such as diverticulitis, abdominal abscess, or ectopic pregnancy.  Workup will fully clarify much of this but I believe the patient would likely require CT imaging given extent of pain.  Will trial management of pain with fentanyl  50 mcg. Patient labs unremarkable.  Group A strep still pending at this time but no evidence of viral process.  Mild leukocytosis indicating possible infection.  Fentanyl  given for pain control. CT abdomen pelvis is unremarkable.  Doubt ovarian torsion given lack of severe or sudden onset of pain. Patient tested positive for Group A strep. Will treat with Decadron  and amoxicillin . Advised patient that this is likely source of current pain. Doubt more concerning cause. Given patient otherwise is well appearing, discharged home with prescription for amoxicillin  sent to pharmacy for continued management of symptoms.  Discussed return precautions. Discharged home in stable condition.  Final Clinical Impression(s) / ED Diagnoses Final diagnoses:  Strep pharyngitis  Left lower quadrant abdominal pain    Rx / DC Orders ED Discharge Orders          Ordered    amoxicillin  (AMOXIL ) 500 MG capsule  2 times daily        08/28/23 2012               Haelyn Forgey A, PA-C 08/28/23 2210    Randol Simmonds, MD 08/31/23 [phone]

## 2023-08-28 NOTE — Discharge Instructions (Signed)
 You were seen in the ER today for concerns of abdominal pain. Your labs and imaging were reassuring, but you did positive for strep throat. This can cause abdominal pain so I suspect this is the source of your pain given your reassuring workup. For this, you will take amoxicillin  which has been sent to your pharmacy. Please take this as prescribed. Return to the ER if you have any new or worsening symptoms. Otherwise please follow up with your primary care provider.

## 2023-08-28 NOTE — ED Triage Notes (Signed)
 The pt is c/o abd pain and body aches  for 3-4 days unknown temp

## 2023-08-28 NOTE — ED Notes (Signed)
 2 failed IV attempts, Jari Favre, [place] notified.

## 2023-09-03 ENCOUNTER — Telehealth: Payer: Self-pay

## 2023-09-03 NOTE — Progress Notes (Signed)
Transition Care Management Unsuccessful Follow-up Telephone Call  Date of discharge and from where:  08/28/2023 [facility]  Attempts:  1st Attempt  Reason for unsuccessful TCM follow-up call:  No answer/busy  Jovon Winterhalter [facility]  [facility] Guide Direct Dial: [phone]  Fax: [phone] Website: [email or web address]

## 2023-09-04 ENCOUNTER — Telehealth: Payer: Self-pay

## 2023-09-04 NOTE — Progress Notes (Signed)
Transition Care Management Unsuccessful Follow-up Telephone Call  Date of discharge and from where:  08/28/2023 [facility]  Attempts:  2nd Attempt  Reason for unsuccessful TCM follow-up call:  No answer/busy  Andrea Ferrer [facility]  [facility] Guide Direct Dial: [phone]  Fax: [phone] Website: [email or web address]

## 2023-09-19 DIAGNOSIS — Z419 Encounter for procedure for purposes other than remedying health state, unspecified: Secondary | ICD-10-CM | POA: Diagnosis not present

## 2023-10-17 DIAGNOSIS — Z419 Encounter for procedure for purposes other than remedying health state, unspecified: Secondary | ICD-10-CM | POA: Diagnosis not present

## 2023-11-28 DIAGNOSIS — Z419 Encounter for procedure for purposes other than remedying health state, unspecified: Secondary | ICD-10-CM | POA: Diagnosis not present

## 2023-11-30 ENCOUNTER — Encounter (HOSPITAL_COMMUNITY): Payer: Self-pay

## 2023-11-30 ENCOUNTER — Emergency Department (HOSPITAL_COMMUNITY)
Admission: EM | Admit: 2023-11-30 | Discharge: 2023-11-30 | Disposition: A | Discharge status: Home / Self Care | Attending: Emergency Medicine | Admitting: Emergency Medicine

## 2023-11-30 ENCOUNTER — Other Ambulatory Visit: Payer: Self-pay

## 2023-11-30 DIAGNOSIS — J029 Acute pharyngitis, unspecified: Secondary | ICD-10-CM | POA: Diagnosis not present

## 2023-11-30 DIAGNOSIS — H9203 Otalgia, bilateral: Secondary | ICD-10-CM | POA: Insufficient documentation

## 2023-11-30 DIAGNOSIS — J069 Acute upper respiratory infection, unspecified: Secondary | ICD-10-CM | POA: Diagnosis not present

## 2023-11-30 LAB — RESP PANEL BY RT-PCR (RSV, FLU A&B, COVID)  RVPGX2
Influenza A by PCR: NEGATIVE
Influenza B by PCR: NEGATIVE
Resp Syncytial Virus by PCR: NEGATIVE
SARS Coronavirus 2 by RT PCR: NEGATIVE

## 2023-11-30 LAB — GROUP A STREP BY PCR: Group A Strep by PCR: NOT DETECTED

## 2023-11-30 MED ORDER — DM-GUAIFENESIN ER 30-600 MG PO TB12: 1.0000 | ORAL_TABLET | Freq: Two times a day (BID) | ORAL | 0 refills | Status: AC

## 2023-11-30 MED ORDER — ACETAMINOPHEN 325 MG PO TABS
650.0000 mg | ORAL_TABLET | Freq: Once | ORAL | Status: AC
  Administered 2023-11-30: 650 mg via ORAL
  Filled 2023-11-30: qty 2

## 2023-11-30 MED ORDER — IBUPROFEN 600 MG PO TABS: 600.0000 mg | ORAL_TABLET | Freq: Four times a day (QID) | ORAL | 0 refills | Status: AC | PRN

## 2023-11-30 NOTE — ED Provider Triage Note (Signed)
 Emergency Medicine Provider Triage Evaluation Note  Anet Logsdon , a 43 y.o. female  was evaluated in triage.  Pt complains of URI symptoms, bug bites. No sick contacts. Tylenol last PM. Also concerned for bed bugs. Living in hotel currently.  Review of Systems  Positive:  Negative:   Physical Exam  BP (!) 146/87   Pulse 98   Temp 98.2 F (36.8 C)   Resp 20   Ht 5\' 3"  (1.6 m)   Wt 72.6 kg   SpO2 100%   BMI 28.34 kg/m  Gen:   Awake, no distress   Resp:  Normal effort  MSK:   Moves extremities without difficulty  Other:    Medical Decision Making  Medically screening exam initiated at 2:20 PM.  Appropriate orders placed.  Saretta Dahlem was informed that the remainder of the evaluation will be completed by another provider, this initial triage assessment does not replace that evaluation, and the importance of remaining in the ED until their evaluation is complete.     Adel Aden, PA-C 11/30/23 1420

## 2023-11-30 NOTE — ED Provider Notes (Signed)
 [place] EMERGENCY DEPARTMENT AT [facility] Provider Note   CSN: [id number] Arrival date & time: 11/30/23  1401     History  Chief Complaint  Patient presents with   URI    Denise Brandt is a 43 y.o. female.  HPI   43 year old female presents emergency department with concern for URI symptoms.  Patient states this started last night, was more prominent this morning.  She is complaining of nasal congestion, sore throat, bilateral ear pain, sinus pressure.  She denies any fever, productive cough, vomiting, diarrhea, chest pain, genitourinary symptoms.  No known sick contacts.  Of note she is living in a hotel and feels like she had bites from sleeping in the bed, possible bedbug bites.  Home Medications Prior to Admission medications   Medication Sig Start Date End Date Taking? Authorizing Provider  Prenatal Vit-Fe Fumarate-FA (PRENATAL PLUS VITAMIN/MINERAL) 27-1 MG TABS Take 1 tablet by mouth daily. Patient not taking: Reported on 09/11/2022 09/16/21   Hurshel Party, CNM      Allergies    Patient has no known allergies.    Review of Systems   Review of Systems  Constitutional:  Negative for fever.  HENT:  Positive for congestion, ear pain, rhinorrhea, sinus pressure and sore throat.   Respiratory:  Negative for shortness of breath.   Cardiovascular:  Negative for chest pain.  Gastrointestinal:  Negative for abdominal pain, diarrhea and vomiting.  Musculoskeletal:  Positive for myalgias. Negative for neck pain and neck stiffness.  Skin:  Negative for rash.  Neurological:  Negative for headaches.    Physical Exam Updated Vital Signs BP (!) 129/99   Pulse 88   Temp 98.8 F (37.1 C) (Oral)   Resp 20   Ht 5\' 3"  (1.6 m)   Wt 72.6 kg   SpO2 98%   BMI 28.34 kg/m  Physical Exam Vitals and nursing note reviewed.  Constitutional:      Appearance: Normal appearance.  HENT:     Head: Normocephalic.     Nose: Congestion and rhinorrhea present.      Mouth/Throat:     Mouth: Mucous membranes are moist.     Pharynx: Posterior oropharyngeal erythema present. No oropharyngeal exudate.  Eyes:     Pupils: Pupils are equal, round, and reactive to light.  Cardiovascular:     Rate and Rhythm: Normal rate.  Pulmonary:     Effort: Pulmonary effort is normal. No respiratory distress.  Abdominal:     Palpations: Abdomen is soft.     Tenderness: There is no abdominal tenderness.  Musculoskeletal:     Cervical back: No rigidity.  Skin:    General: Skin is warm.  Neurological:     Mental Status: She is alert and oriented to person, place, and time. Mental status is at baseline.  Psychiatric:        Mood and Affect: Mood normal.     ED Results / Procedures / Treatments   Labs (all labs ordered are listed, but only abnormal results are displayed) Labs Reviewed  RESP PANEL BY RT-PCR (RSV, FLU A&B, COVID)  RVPGX2  GROUP A STREP BY PCR    EKG None  Radiology No results found.  Procedures Procedures    Medications Ordered in ED Medications  acetaminophen (TYLENOL) tablet 650 mg (650 mg Oral Given 11/30/23 1739)    ED Course/ Medical Decision Making/ A&P  Medical Decision Making Risk OTC drugs. Prescription drug management.   44 year old female presents emergency department with URI symptoms including nasal congestion, rhinorrhea, sinus pressure, sore throat and ear pain.  Vitals are normal and stable.  She has nasal congestion and finding of URI on physical exam.  Erythematous throat without exudates.  Respiratory panel is negative, strep swab is negative.  Patient is been able to eat and drink without difficulty.  No other acute symptoms.  Will treat symptomatically for viral URI.  Patient at this time appears safe and stable for discharge and close outpatient follow up. Discharge plan and strict return to ED precautions discussed, patient verbalizes understanding and  agreement.        Final Clinical Impression(s) / ED Diagnoses Final diagnoses:  None    Rx / DC Orders ED Discharge Orders     None         Flonnie Humphrey, DO 11/30/23 2124

## 2023-11-30 NOTE — ED Notes (Signed)
 PT provided with [place] sandwich and ginger ale per provider approval.

## 2023-11-30 NOTE — ED Triage Notes (Signed)
 Reports nasal congestion, bodyaches and loss of taste and smell that started last night.  Reports 102 fever at home.  Denies sick contact but also reports bug bites to right leg.

## 2023-11-30 NOTE — Discharge Instructions (Signed)
 You have been seen and discharged from the emergency department.  Your strep, flu, COVID, RSV swabs were negative.  Take prescription medicine for symptom control.  Stay well-hydrated.  Follow-up with your primary provider for further evaluation and further care. Take home medications as prescribed. If you have any worsening symptoms or further concerns for your health please return to an emergency department for further evaluation.

## 2023-12-28 DIAGNOSIS — Z419 Encounter for procedure for purposes other than remedying health state, unspecified: Secondary | ICD-10-CM | POA: Diagnosis not present

## 2024-01-28 DIAGNOSIS — Z419 Encounter for procedure for purposes other than remedying health state, unspecified: Secondary | ICD-10-CM | POA: Diagnosis not present

## 2024-02-27 DIAGNOSIS — Z419 Encounter for procedure for purposes other than remedying health state, unspecified: Secondary | ICD-10-CM | POA: Diagnosis not present

## 2024-03-29 DIAGNOSIS — Z419 Encounter for procedure for purposes other than remedying health state, unspecified: Secondary | ICD-10-CM | POA: Diagnosis not present

## 2024-04-29 DIAGNOSIS — Z419 Encounter for procedure for purposes other than remedying health state, unspecified: Secondary | ICD-10-CM | POA: Diagnosis not present

## 2024-05-24 ENCOUNTER — Emergency Department (HOSPITAL_COMMUNITY)

## 2024-05-24 ENCOUNTER — Emergency Department (HOSPITAL_COMMUNITY): Admission: EM | Admit: 2024-05-24 | Discharge: 2024-05-25 | Discharge status: Against Medical Advice

## 2024-05-24 ENCOUNTER — Other Ambulatory Visit: Payer: Self-pay

## 2024-05-24 DIAGNOSIS — R519 Headache, unspecified: Secondary | ICD-10-CM | POA: Insufficient documentation

## 2024-05-24 DIAGNOSIS — M25511 Pain in right shoulder: Secondary | ICD-10-CM | POA: Diagnosis not present

## 2024-05-24 DIAGNOSIS — Z5321 Procedure and treatment not carried out due to patient leaving prior to being seen by health care provider: Secondary | ICD-10-CM | POA: Diagnosis not present

## 2024-05-24 DIAGNOSIS — M7731 Calcaneal spur, right foot: Secondary | ICD-10-CM | POA: Diagnosis not present

## 2024-05-24 DIAGNOSIS — S0003XA Contusion of scalp, initial encounter: Secondary | ICD-10-CM | POA: Diagnosis not present

## 2024-05-24 DIAGNOSIS — S8991XA Unspecified injury of right lower leg, initial encounter: Secondary | ICD-10-CM | POA: Diagnosis not present

## 2024-05-24 DIAGNOSIS — M25571 Pain in right ankle and joints of right foot: Secondary | ICD-10-CM | POA: Diagnosis not present

## 2024-05-24 DIAGNOSIS — S0990XA Unspecified injury of head, initial encounter: Secondary | ICD-10-CM | POA: Diagnosis present

## 2024-05-24 DIAGNOSIS — M79661 Pain in right lower leg: Secondary | ICD-10-CM | POA: Diagnosis not present
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
## 2024-05-25 ENCOUNTER — Emergency Department (HOSPITAL_COMMUNITY)

## 2024-05-25 ENCOUNTER — Emergency Department (HOSPITAL_COMMUNITY)
Admission: EM | Admit: 2024-05-25 | Discharge: 2024-05-25 | Disposition: A | Discharge status: Home / Self Care | Source: Home / Self Care

## 2024-05-25 ENCOUNTER — Encounter (HOSPITAL_COMMUNITY): Payer: Self-pay

## 2024-05-25 DIAGNOSIS — M25511 Pain in right shoulder: Secondary | ICD-10-CM | POA: Diagnosis not present

## 2024-05-25 DIAGNOSIS — M25571 Pain in right ankle and joints of right foot: Secondary | ICD-10-CM | POA: Insufficient documentation

## 2024-05-25 DIAGNOSIS — M79661 Pain in right lower leg: Secondary | ICD-10-CM | POA: Diagnosis not present

## 2024-05-25 DIAGNOSIS — S0003XA Contusion of scalp, initial encounter: Secondary | ICD-10-CM | POA: Diagnosis not present

## 2024-05-25 DIAGNOSIS — M7731 Calcaneal spur, right foot: Secondary | ICD-10-CM | POA: Diagnosis not present

## 2024-05-25 MED ORDER — KETOROLAC TROMETHAMINE 15 MG/ML IJ SOLN
15.0000 mg | Freq: Once | INTRAMUSCULAR | Status: AC
  Administered 2024-05-25: 15 mg via INTRAMUSCULAR
  Filled 2024-05-25: qty 1
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
## 2024-05-25 NOTE — ED Notes (Signed)
 Pt also called by another ED Tech, no answer.

## 2024-05-25 NOTE — ED Notes (Signed)
 Pt given discharge instructions and verbalized understanding. Opportunity given to ask questions.

## 2024-05-25 NOTE — Discharge Instructions (Addendum)
 Your imaging showed no evidence of fracture or bony abnormality.  You will probably be sore for a few days.  You can alternate Tylenol  and Motrin  every 3 hours as needed for pain.
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
## 2024-05-25 NOTE — ED Notes (Signed)
Pt called to be seen in triage, no answer
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
## 2024-05-25 NOTE — ED Notes (Signed)
 Patient transported to X-ray

## 2024-05-25 NOTE — ED Notes (Signed)
 ED Provider at bedside.

## 2024-05-30 ENCOUNTER — Telehealth: Admitting: Nurse Practitioner

## 2024-05-30 ENCOUNTER — Other Ambulatory Visit: Payer: Self-pay

## 2024-05-30 DIAGNOSIS — K5900 Constipation, unspecified: Secondary | ICD-10-CM | POA: Diagnosis not present

## 2024-05-30 DIAGNOSIS — J069 Acute upper respiratory infection, unspecified: Secondary | ICD-10-CM | POA: Diagnosis not present

## 2024-05-30 MED ORDER — POLYETHYLENE GLYCOL 3350 17 GM/SCOOP PO POWD
17.0000 g | Freq: Every day | ORAL | 1 refills | Status: AC | PRN
  Filled 2024-05-30: qty 510, 30d supply, fill #0

## 2024-05-30 MED ORDER — PSEUDOEPH-BROMPHEN-DM 30-2-10 MG/5ML PO SYRP
5.0000 mL | ORAL_SOLUTION | Freq: Four times a day (QID) | ORAL | 0 refills | Status: AC | PRN
  Filled 2024-05-30: qty 120, 6d supply, fill #0

## 2024-05-30 NOTE — Progress Notes (Signed)
 Acute Video Visit    Virtual Visit Consent:   Denise Brandt, you are scheduled for a virtual visit with a  provider today.     Just as with appointments in the office, your consent must be obtained to participate.  Your consent will be active for this visit and any virtual visit you may have with one of our providers in the next 365 days.     If you have a MyChart account, a copy of this consent can be sent to you electronically.  All virtual visits are billed to your insurance company just like a traditional visit in the office.    If the connection with a video visit is poor, the visit may have to be switched to a telephone visit.  With either a video or telephone visit, we are not always able to ensure that we have a secure connection.     I need to obtain your verbal consent now.   Are you willing to proceed with your visit today?    Denise Brandt has provided verbal consent on 05/30/2024 for a virtual visit (video or telephone).   Denise Kitty, FNP  Date: 05/30/2024 10:07 AM  Subjective:     Patient ID: Denise Brandt, female    DOB: June 22, 1981, 43 y.o.   MRN: [id number]  LILLETTE Denise Brandt, connected with  Amel Kitch  ([id number], 03-28-1981) on 05/30/24 at 11:15 AM EDT by a video-enabled telemedicine application and verified that I am speaking with the correct person using two identifiers.   Location: Patient: [facility]  Provider: Virtual Visit Location Provider: Home Office   I discussed the limitations of evaluation and management by telemedicine and the availability of in person appointments. The patient expressed understanding and agreed to proceed.     HPI Denise Brandt is a 43 y.o. who identifies as a female who was assigned female at birth, and is being seen today for URI symptoms   Has a productive cough, she is feeling constipation and has used miralax  for support in the past  URI symptoms have been present for 2 days  including nasal congestion, low grade fever, productive cough and body aches. She has been using tea for relief so far   She is presenting from the [facility] today where she received SDOH support and medical treatment typically.  Review of Systems  Constitutional:  Positive for fever and malaise/fatigue.  HENT:  Positive for congestion.   Respiratory:  Positive for cough.   Gastrointestinal:  Positive for constipation.  Skin: Negative.         Objective:      Physical Exam Constitutional:      General: She is not in acute distress.    Appearance: Normal appearance.  HENT:     Nose: Congestion present.     Mouth/Throat:     Mouth: Mucous membranes are moist.  Pulmonary:     Effort: Pulmonary effort is normal.  Musculoskeletal:     Cervical back: Neck supple.  Neurological:     Mental Status: She is alert and oriented to person, place, and time.  Psychiatric:        Mood and Affect: Mood normal.           Assessment & Plan:    1. Constipation, unspecified constipation type (Primary)  - polyethylene glycol powder (GLYCOLAX /MIRALAX ) 17 GM/SCOOP powder; Take 17 g by mouth daily as needed for moderate constipation. Dissolve 1 capful (17g) in 4-8 ounces of liquid and take by  mouth daily.  Dispense: 3350 g; Refill: 1  2. Viral upper respiratory tract infection  - brompheniramine-pseudoephedrine-DM 30-2-10 MG/5ML syrup; Take 5 mLs by mouth 4 (four) times daily as needed.  Dispense: 120 mL; Refill: 0   Encouraged follow up to discuss other care gaps and transition to primary care if she would like   Follow Up Instructions: I discussed the assessment and treatment plan with the patient. The patient was provided an opportunity to ask questions and all were answered. The patient agreed with the plan and demonstrated an understanding of the instructions.  A copy of instructions were sent to the patient via MyChart unless otherwise noted below.    The patient was advised to call  back or seek an in-person evaluation if the symptoms worsen or if the condition fails to improve as anticipated.    Denise Kitty, FNP  **Disclaimer: This note may have been dictated with voice recognition software. Similar sounding words can inadvertently be transcribed and this note may contain transcription errors which may not have been corrected upon publication of note.**

## 2024-06-08 ENCOUNTER — Other Ambulatory Visit: Payer: Self-pay

## 2024-08-22 NOTE — Progress Notes (Signed)
 Pt attended 05/30/2024 [facility] Appointment.   Per initial f/u pt was reached out via phone and calls could not be completed.   During appointment provider noted, She is presenting from the [facility] today where she received SDOH support and medical treatment typically. Pt has an address on file and was mailed letter with [facility] flyer, [facility] flyer, & [place] [address] card.  Per chart review pt does have a PCP [facility]; [facility] - no [facility]-visible encounters), insurance, and is a smoker. Pt does not indicate any SDOH needs at this time, but [facility] was unable to verify with pt.  Additional pt f/u to be scheduled at this time per health equity protocol.
